# Patient Record
Sex: Female | Born: 1950
Health system: Southern US, Community
[De-identification: ages and names within clinical notes are randomized; demographics above are authoritative.]

## PROBLEM LIST (undated history)

## (undated) DIAGNOSIS — K219 Gastro-esophageal reflux disease without esophagitis: Secondary | ICD-10-CM

## (undated) DIAGNOSIS — Z860100 Personal history of colon polyps, unspecified: Secondary | ICD-10-CM

## (undated) DIAGNOSIS — E7800 Pure hypercholesterolemia, unspecified: Secondary | ICD-10-CM

## (undated) DIAGNOSIS — M810 Age-related osteoporosis without current pathological fracture: Secondary | ICD-10-CM

## (undated) DIAGNOSIS — D510 Vitamin B12 deficiency anemia due to intrinsic factor deficiency: Secondary | ICD-10-CM

## (undated) DIAGNOSIS — E039 Hypothyroidism, unspecified: Secondary | ICD-10-CM

## (undated) DIAGNOSIS — I10 Essential (primary) hypertension: Secondary | ICD-10-CM

## (undated) HISTORY — DX: Vitamin B12 deficiency anemia due to intrinsic factor deficiency: D51.0

## (undated) HISTORY — DX: Pure hypercholesterolemia, unspecified: E78.00

## (undated) HISTORY — PX: BREAST BIOPSY: SHX20

## (undated) HISTORY — DX: Personal history of colon polyps, unspecified: Z86.0100

## (undated) HISTORY — DX: Age-related osteoporosis without current pathological fracture: M81.0

## (undated) HISTORY — DX: Essential (primary) hypertension: I10

## (undated) HISTORY — DX: Personal history of colonic polyps: Z86.010

## (undated) HISTORY — DX: Hypothyroidism, unspecified: E03.9

## (undated) HISTORY — DX: Gastro-esophageal reflux disease without esophagitis: K21.9

---

## 1977-04-14 HISTORY — PX: DILATION AND CURETTAGE OF UTERUS: SHX78

## 1995-04-15 HISTORY — PX: ABDOMINAL HYSTERECTOMY: SHX81

## 2004-08-30 ENCOUNTER — Ambulatory Visit: Payer: Self-pay | Admitting: Unknown Physician Specialty

## 2005-07-22 ENCOUNTER — Ambulatory Visit: Payer: Self-pay | Admitting: Internal Medicine

## 2012-01-19 ENCOUNTER — Other Ambulatory Visit: Payer: Self-pay | Admitting: Internal Medicine

## 2012-01-19 MED ORDER — VITAMIN B-12 1000 MCG PO TABS: 1000.0000 ug | ORAL_TABLET | Freq: Every day | ORAL | Status: DC

## 2012-01-19 NOTE — Telephone Encounter (Signed)
Left message on machine at home for patient to return call. 

## 2012-01-19 NOTE — Telephone Encounter (Signed)
I think what she is needing a refill on is b12 q month.  If this is correct, can refill x 1 year.

## 2012-01-19 NOTE — Telephone Encounter (Signed)
Rx sent to pharmacy electronically.   

## 2012-01-19 NOTE — Telephone Encounter (Signed)
Pt is needing refill on b-12 [facility] uses [facility] on [address]

## 2012-01-22 ENCOUNTER — Other Ambulatory Visit: Payer: Self-pay | Admitting: *Deleted

## 2012-01-22 MED ORDER — LISINOPRIL 10 MG PO TABS: 10.0000 mg | ORAL_TABLET | Freq: Every day | ORAL | Status: DC

## 2012-01-22 NOTE — Telephone Encounter (Signed)
PC to [facility] at [facility] to add 3 additional refills.

## 2012-01-22 NOTE — Telephone Encounter (Signed)
Ok to refill x 4 

## 2012-01-22 NOTE — Telephone Encounter (Signed)
Faxed refill request received from pharmacy for LISINOPRIL Last filled by MD on 07/16/11, #30 X 5 at previous practice  Follow up scheduled for 04/23/12. Please advise refills.  Thanks.

## 2012-01-30 ENCOUNTER — Telehealth: Payer: Self-pay | Admitting: Internal Medicine

## 2012-01-30 NOTE — Telephone Encounter (Signed)
Pt mailed in new pt packet and had a stick on it wanting to know if she could have her b12 in vials for injections instead of tablets

## 2012-01-30 NOTE — Telephone Encounter (Signed)
Please confirm with pt she has been getting injections (b12) once per month.  If so, then ok to refill b12 q month (x 1 year).

## 2012-02-02 NOTE — Telephone Encounter (Signed)
Left message for pt to callback office.  

## 2012-02-04 ENCOUNTER — Other Ambulatory Visit: Payer: Self-pay | Admitting: *Deleted

## 2012-02-04 MED ORDER — METOPROLOL TARTRATE 25 MG PO TABS: 25.0000 mg | ORAL_TABLET | Freq: Two times a day (BID) | ORAL | Status: DC

## 2012-02-04 NOTE — Telephone Encounter (Signed)
Called patient to inquire about her Metoprolol Rx. Patient wanted to know if she can have B-12 injections and syringes to give herself?

## 2012-02-04 NOTE — Telephone Encounter (Signed)
I don't mind her giving herself the injections, but I would prefer her to come here for her next shot and let us make sure she is giving them properly.

## 2012-02-04 NOTE — Telephone Encounter (Signed)
Rx request sent in for refill.

## 2012-02-04 NOTE — Telephone Encounter (Signed)
Received a refill request for Metoprolol Tartrate 25mg  take 1/2 tablet by mouth twice daily, med is not on med list is it ok to refill? Per fax it was last filled 11/19/2011

## 2012-02-04 NOTE — Telephone Encounter (Signed)
I think the reason why it is not on her med list is because I haven't see her here (yet).  I think she just called in for the meds to be refilled.  I am ok to refill x 3 if can confirm has been on this regularly.  Thanks.

## 2012-02-11 NOTE — Telephone Encounter (Signed)
Patient requesting refill on B-12 injections

## 2012-02-11 NOTE — Telephone Encounter (Signed)
Left message on patients cell and with husband for patient to return call.

## 2012-02-11 NOTE — Telephone Encounter (Signed)
Ok to refill b12  q month (for 1 year).  See note - need to make sure she knows how to give.

## 2012-02-20 MED ORDER — METOPROLOL TARTRATE 25 MG PO TABS: 25.0000 mg | ORAL_TABLET | Freq: Two times a day (BID) | ORAL | Status: DC

## 2012-02-20 MED ORDER — CYANOCOBALAMIN 1000 MCG/ML IJ SOLN: 1000.0000 ug | INTRAMUSCULAR | Status: DC

## 2012-02-20 NOTE — Telephone Encounter (Signed)
Notified patient prescription was sent in. 

## 2012-03-13 ENCOUNTER — Emergency Department: Payer: Self-pay | Admitting: Emergency Medicine

## 2012-03-13 LAB — CBC WITH DIFFERENTIAL/PLATELET
Basophil %: 0.6 %
HCT: 37 % (ref 35.0–47.0)
HGB: 12.9 g/dL (ref 12.0–16.0)
Lymphocyte %: 11 %
MCH: 31 pg (ref 26.0–34.0)
MCHC: 35 g/dL (ref 32.0–36.0)
MCV: 89 fL (ref 80–100)
Monocyte #: 0.4 x10 3/mm (ref 0.2–0.9)
Monocyte %: 5.3 %
Neutrophil #: 6.7 10*3/uL — ABNORMAL HIGH (ref 1.4–6.5)
RBC: 4.17 10*6/uL (ref 3.80–5.20)
RDW: 13.3 % (ref 11.5–14.5)
WBC: 8.1 10*3/uL (ref 3.6–11.0)

## 2012-03-13 LAB — COMPREHENSIVE METABOLIC PANEL
Alkaline Phosphatase: 46 U/L — ABNORMAL LOW (ref 50–136)
Anion Gap: 9 (ref 7–16)
BUN: 13 mg/dL (ref 7–18)
Chloride: 103 mmol/L (ref 98–107)
EGFR (African American): >60
Osmolality: 272 (ref 275–301)
Potassium: 4.5 mmol/L (ref 3.5–5.1)
SGOT(AST): 50 U/L — ABNORMAL HIGH (ref 15–37)
Sodium: 136 mmol/L (ref 136–145)
Total Protein: 7.2 g/dL (ref 6.4–8.2)

## 2012-03-19 ENCOUNTER — Telehealth: Payer: Self-pay | Admitting: Internal Medicine
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
## 2012-03-19 NOTE — Telephone Encounter (Signed)
Called pt and left a message for her to call back to schedule next week and to discuss insurance

## 2012-03-19 NOTE — Telephone Encounter (Signed)
Ok to put her where you put her.  thanks
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
## 2012-03-19 NOTE — Telephone Encounter (Signed)
Spoke with pt she couldn't do Monday but you did have a slot of Thursday at 10 am I put her there but I wanted to ask you to make sure this was ok. And I let her know about the insurance part, she is ok with that and says thank you for giving her the heads up.

## 2012-03-25 ENCOUNTER — Encounter: Payer: Self-pay | Admitting: Internal Medicine

## 2012-03-25 ENCOUNTER — Telehealth: Payer: Self-pay | Admitting: *Deleted

## 2012-03-25 ENCOUNTER — Ambulatory Visit (INDEPENDENT_AMBULATORY_CARE_PROVIDER_SITE_OTHER): Payer: BC Managed Care – PPO | Admitting: Internal Medicine

## 2012-03-25 VITALS — BP 132/72 | HR 61 | Temp 98.4°F | Ht 65.0 in | Wt 139.0 lb

## 2012-03-25 DIAGNOSIS — Z23 Encounter for immunization: Secondary | ICD-10-CM

## 2012-03-25 DIAGNOSIS — E7800 Pure hypercholesterolemia, unspecified: Secondary | ICD-10-CM

## 2012-03-25 DIAGNOSIS — M810 Age-related osteoporosis without current pathological fracture: Secondary | ICD-10-CM

## 2012-03-25 DIAGNOSIS — I10 Essential (primary) hypertension: Secondary | ICD-10-CM

## 2012-03-25 DIAGNOSIS — E039 Hypothyroidism, unspecified: Secondary | ICD-10-CM

## 2012-03-25 NOTE — Telephone Encounter (Signed)
error 

## 2012-03-28 ENCOUNTER — Encounter: Payer: Self-pay | Admitting: Internal Medicine

## 2012-03-28 DIAGNOSIS — M8580 Other specified disorders of bone density and structure, unspecified site: Secondary | ICD-10-CM | POA: Insufficient documentation

## 2012-03-28 DIAGNOSIS — M810 Age-related osteoporosis without current pathological fracture: Secondary | ICD-10-CM | POA: Insufficient documentation

## 2012-03-28 DIAGNOSIS — E039 Hypothyroidism, unspecified: Secondary | ICD-10-CM | POA: Insufficient documentation

## 2012-03-28 DIAGNOSIS — E7800 Pure hypercholesterolemia, unspecified: Secondary | ICD-10-CM | POA: Insufficient documentation

## 2012-03-28 DIAGNOSIS — I10 Essential (primary) hypertension: Secondary | ICD-10-CM | POA: Insufficient documentation
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
## 2012-04-01 ENCOUNTER — Telehealth: Payer: Self-pay | Admitting: *Deleted

## 2012-04-01 NOTE — Telephone Encounter (Signed)
Please notify pt -I have not received any paper work on this pt.

## 2012-04-01 NOTE — Telephone Encounter (Signed)
Patient called to see if [employer] (her employers) had sent over forms for you to fill out concerning short term disabillity on patient.

## 2012-04-02 NOTE — Telephone Encounter (Signed)
Will complete forms

## 2012-04-02 NOTE — Telephone Encounter (Signed)
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

## 2012-04-05 NOTE — Telephone Encounter (Signed)
Patient called to check on disability forms. She said she has not been paid in two weeks and her employer's are not going to pay her until they receive forms. They also said that if she gets the forms to them before christmas they can process her paperwork and pay her next week. Employer's Fax [phone]--Claim [id number] that has to be on form

## 2012-04-05 NOTE — Telephone Encounter (Signed)
Form completed and on your desk.  Needs to be sent asap

## 2012-04-06 NOTE — Telephone Encounter (Signed)
Faxed forms , notified patient

## 2012-04-13 ENCOUNTER — Encounter: Payer: Self-pay | Admitting: Internal Medicine

## 2012-04-13 ENCOUNTER — Ambulatory Visit (INDEPENDENT_AMBULATORY_CARE_PROVIDER_SITE_OTHER): Payer: BC Managed Care – PPO | Admitting: Internal Medicine

## 2012-04-13 VITALS — BP 130/72 | HR 55 | Temp 98.2°F | Resp 16 | Wt 138.5 lb

## 2012-04-13 DIAGNOSIS — Z1331 Encounter for screening for depression: Secondary | ICD-10-CM

## 2012-04-13 DIAGNOSIS — D510 Vitamin B12 deficiency anemia due to intrinsic factor deficiency: Secondary | ICD-10-CM | POA: Insufficient documentation

## 2012-04-13 DIAGNOSIS — S2220XA Unspecified fracture of sternum, initial encounter for closed fracture: Secondary | ICD-10-CM

## 2012-04-13 DIAGNOSIS — R079 Chest pain, unspecified: Secondary | ICD-10-CM

## 2012-04-14 ENCOUNTER — Encounter: Payer: Self-pay | Admitting: Internal Medicine
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
## 2012-04-23 ENCOUNTER — Ambulatory Visit (INDEPENDENT_AMBULATORY_CARE_PROVIDER_SITE_OTHER): Payer: BC Managed Care – PPO | Admitting: Internal Medicine

## 2012-04-23 ENCOUNTER — Encounter: Payer: Self-pay | Admitting: Internal Medicine

## 2012-04-23 ENCOUNTER — Telehealth: Payer: Self-pay | Admitting: *Deleted

## 2012-04-23 VITALS — BP 118/70 | HR 56 | Temp 98.5°F | Ht 65.0 in | Wt 141.0 lb

## 2012-04-23 DIAGNOSIS — I10 Essential (primary) hypertension: Secondary | ICD-10-CM

## 2012-04-23 DIAGNOSIS — M25519 Pain in unspecified shoulder: Secondary | ICD-10-CM

## 2012-04-23 DIAGNOSIS — R945 Abnormal results of liver function studies: Secondary | ICD-10-CM

## 2012-04-23 DIAGNOSIS — K769 Liver disease, unspecified: Secondary | ICD-10-CM

## 2012-04-23 DIAGNOSIS — M25569 Pain in unspecified knee: Secondary | ICD-10-CM

## 2012-04-23 LAB — HEPATIC FUNCTION PANEL
Alkaline Phosphatase: 42 U/L (ref 39–117)
Bilirubin, Direct: 0.1 mg/dL (ref 0.0–0.3)
Total Bilirubin: 0.9 mg/dL (ref 0.3–1.2)
Total Protein: 7.1 g/dL (ref 6.0–8.3)

## 2012-04-23 NOTE — Telephone Encounter (Signed)
Patient stated while in office that her insurance does not want to pay for synthroid. They want a call from Dr. Lorin Picket giving reasons to why patient takes medication.

## 2012-04-23 NOTE — Telephone Encounter (Signed)
Called patient to get information on her synthroid prescription. Left message for patient to return call. Have information on synthroid script in pa folder on desk.

## 2012-04-23 NOTE — Telephone Encounter (Addendum)
Patient wanting lab results.   Call patient at work with any information if you are calling her at normal business hours at [phone] [phone].

## 2012-04-24 ENCOUNTER — Encounter: Payer: Self-pay | Admitting: Internal Medicine

## 2012-04-24 NOTE — Assessment & Plan Note (Signed)
Blood pressure under good control.  Follow.   
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
## 2012-04-27 ENCOUNTER — Other Ambulatory Visit: Payer: Self-pay | Admitting: Internal Medicine

## 2012-04-27 NOTE — Telephone Encounter (Signed)
Called pharmacy and was advised that rx was sent in error.

## 2012-04-27 NOTE — Telephone Encounter (Signed)
[facility] they said patient needs prior auth for synthyroid brand only. Call [phone] patient's ID #[phone].

## 2012-04-27 NOTE — Telephone Encounter (Signed)
Called [phone] spoke to Reuel Boom he is faxing the form over, case number [id number]

## 2012-04-27 NOTE — Telephone Encounter (Signed)
Synthroid 100 mcg   #30

## 2012-04-29 ENCOUNTER — Ambulatory Visit: Payer: BC Managed Care – PPO | Admitting: Internal Medicine

## 2012-05-03 ENCOUNTER — Encounter: Payer: Self-pay | Admitting: Internal Medicine

## 2012-05-06 ENCOUNTER — Other Ambulatory Visit: Payer: Self-pay | Admitting: Internal Medicine

## 2012-05-06 MED ORDER — SIMVASTATIN 10 MG PO TABS: 10.0000 mg | ORAL_TABLET | Freq: Every day | ORAL | Status: DC

## 2012-05-06 NOTE — Telephone Encounter (Signed)
simvastatin (ZOCOR) 10 MG tablet  #30

## 2012-05-14 ENCOUNTER — Encounter: Payer: Self-pay | Admitting: Internal Medicine

## 2012-05-15 ENCOUNTER — Encounter: Payer: Self-pay | Admitting: Internal Medicine

## 2012-05-19 ENCOUNTER — Ambulatory Visit (INDEPENDENT_AMBULATORY_CARE_PROVIDER_SITE_OTHER): Payer: BC Managed Care – PPO | Admitting: Internal Medicine

## 2012-05-19 ENCOUNTER — Encounter: Payer: Self-pay | Admitting: Internal Medicine

## 2012-05-19 VITALS — BP 116/80 | HR 60 | Temp 97.8°F | Ht 65.0 in | Wt 140.5 lb

## 2012-05-19 DIAGNOSIS — M25519 Pain in unspecified shoulder: Secondary | ICD-10-CM

## 2012-05-19 DIAGNOSIS — M542 Cervicalgia: Secondary | ICD-10-CM

## 2012-05-19 DIAGNOSIS — M25569 Pain in unspecified knee: Secondary | ICD-10-CM
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
## 2012-06-03 ENCOUNTER — Other Ambulatory Visit: Payer: Self-pay | Admitting: *Deleted

## 2012-06-03 MED ORDER — LISINOPRIL 10 MG PO TABS: 10.0000 mg | ORAL_TABLET | Freq: Every day | ORAL | Status: DC

## 2012-06-03 NOTE — Telephone Encounter (Signed)
Sent in to pharmacy.  

## 2012-06-12 ENCOUNTER — Encounter: Payer: Self-pay | Admitting: Internal Medicine

## 2012-06-25 ENCOUNTER — Ambulatory Visit (INDEPENDENT_AMBULATORY_CARE_PROVIDER_SITE_OTHER): Payer: BC Managed Care – PPO | Admitting: Internal Medicine

## 2012-06-25 ENCOUNTER — Encounter: Payer: Self-pay | Admitting: Internal Medicine

## 2012-06-25 ENCOUNTER — Other Ambulatory Visit (HOSPITAL_COMMUNITY)
Admission: RE | Admit: 2012-06-25 | Discharge: 2012-06-25 | Disposition: A | Discharge status: Home / Self Care | Payer: BC Managed Care – PPO | Source: Ambulatory Visit | Attending: Internal Medicine | Admitting: Internal Medicine

## 2012-06-25 VITALS — BP 120/70 | HR 61 | Temp 97.6°F | Ht 65.0 in | Wt 141.8 lb

## 2012-06-25 DIAGNOSIS — Z860100 Personal history of colon polyps, unspecified: Secondary | ICD-10-CM

## 2012-06-25 DIAGNOSIS — E039 Hypothyroidism, unspecified: Secondary | ICD-10-CM

## 2012-06-25 DIAGNOSIS — Z1151 Encounter for screening for human papillomavirus (HPV): Secondary | ICD-10-CM | POA: Insufficient documentation

## 2012-06-25 DIAGNOSIS — E7800 Pure hypercholesterolemia, unspecified: Secondary | ICD-10-CM

## 2012-06-25 DIAGNOSIS — R300 Dysuria: Secondary | ICD-10-CM

## 2012-06-25 DIAGNOSIS — D510 Vitamin B12 deficiency anemia due to intrinsic factor deficiency: Secondary | ICD-10-CM

## 2012-06-25 DIAGNOSIS — I10 Essential (primary) hypertension: Secondary | ICD-10-CM

## 2012-06-25 DIAGNOSIS — M810 Age-related osteoporosis without current pathological fracture: Secondary | ICD-10-CM

## 2012-06-25 DIAGNOSIS — Z01419 Encounter for gynecological examination (general) (routine) without abnormal findings: Secondary | ICD-10-CM | POA: Insufficient documentation

## 2012-06-25 DIAGNOSIS — N760 Acute vaginitis: Secondary | ICD-10-CM

## 2012-06-25 LAB — POCT URINALYSIS DIPSTICK
Nitrite, UA: NEGATIVE
Protein, UA: NEGATIVE
Spec Grav, UA: 1.01
Urobilinogen, UA: 0.2

## 2012-06-27 ENCOUNTER — Encounter: Payer: Self-pay | Admitting: Internal Medicine

## 2012-06-27 LAB — URINE CULTURE: Colony Count: NO GROWTH

## 2012-06-27 NOTE — Assessment & Plan Note (Signed)
On simvastatin.  Check lipid panel and liver function.   

## 2012-06-27 NOTE — Assessment & Plan Note (Signed)
Not on Boniva.  Last bone density 01/18/10 revealed osteopenia with no significant change from previous.  Improved from the initial.  Continue calcium and vitamin D.

## 2012-06-27 NOTE — Assessment & Plan Note (Signed)
Continue b12 injections.  

## 2012-06-27 NOTE — Assessment & Plan Note (Signed)
On replacement.  Follow tsh.  

## 2012-06-27 NOTE — Assessment & Plan Note (Signed)
Colonoscopy as outlined.  Currently asymptomatic.  Follow.   

## 2012-06-27 NOTE — Assessment & Plan Note (Signed)
Blood pressure under good control.  Follow.  Check metabolic panel.  Same medication regimen.   

## 2012-06-27 NOTE — Progress Notes (Signed)
Subjective:    Patient ID: Wendy Nichols, female    DOB: 22-Nov-1950, 62 y.o.   MRN: [id number]  HPI 62 year old female with past history of hypertension, hypothyroidism, pernicious anemia and hypercholesterolemia who comes in today to follow up on these issues as well as for a complete physical exam.  She states she is doing relatively well.  Handling the stress of her husband's health issues relatively well.  Had her mammogram yesterday.  Saw dermatology.  Had a skin lesion removed from her right leg, right shoulder, left arm and forehead (left side).  No chest pain or tightness.  Doing stretches.  Finished PT.  Neck and shoulder better.  No nausea or vomiting.  Bowels stable.  No vaginal problems.    Past Medical History  Diagnosis Date  . Hypertension   . Hypothyroidism   . Pernicious anemia   . Hypercholesterolemia   . GERD (gastroesophageal reflux disease)   . Osteoporosis   . History of colon polyps     Current Outpatient Prescriptions on File Prior to Visit  Medication Sig Dispense Refill  . aspirin 81 MG tablet Take 81 mg by mouth every other day.      . cyanocobalamin (,VITAMIN B-12,) 1000 MCG/ML injection Inject 1 mL (1,000 mcg total) into the muscle every 30 (thirty) days.  30 mL  12  . levothyroxine (SYNTHROID, LEVOTHROID) 100 MCG tablet Take 100 mcg by mouth daily.      Marland Kitchen lisinopril (PRINIVIL,ZESTRIL) 10 MG tablet Take 1 tablet (10 mg total) by mouth daily.  30 tablet  5  . metoprolol tartrate (LOPRESSOR) 25 MG tablet Take 1/2 tablet once a day      . simvastatin (ZOCOR) 10 MG tablet Take 1 tablet (10 mg total) by mouth at bedtime.  30 tablet  5   No current facility-administered medications on file prior to visit.    Review of Systems Patient denies any headache, lightheadedness or dizziness.  No sinus or allergy symptoms.   No chest pain, tightness or palpitations.  No acid reflux.  No increased shortness of breath, cough or congestion.  No nausea or vomiting.  No  abdominal pain or cramping.  No bowel change, such as diarrhea, constipation, BRBPR or melana.  No urine change.        Objective:   Physical Exam Filed Vitals:   06/25/12 1333  BP: 120/70  Pulse: 61  Temp: 97.6 F (28.79 C)   62 year old female in no acute distress.   HEENT:  Nares- clear.  Oropharynx - without lesions. NECK:  Supple.  Nontender.  No audible bruit.  HEART:  Appears to be regular. LUNGS:  No crackles or wheezing audible.  Respirations even and unlabored.  RADIAL PULSE:  Equal bilaterally.    BREASTS:  No nipple discharge or nipple retraction present.  Could not appreciate any distinct nodules or axillary adenopathy.  ABDOMEN:  Soft, nontender.  Bowel sounds present and normal.  No audible abdominal bruit.  GU:  Normal external genitalia.  Vaginal vault without lesions.  Cervix identified.  Pap performed. Could not appreciate any adnexal masses or tenderness.   RECTAL:  Heme negative.   EXTREMITIES:  No increased edema present.  DP pulses palpable and equal bilaterally.           Assessment & Plan:  HEALTH MAINTENANCE.  Physical today.  Mammogram 06/24/12 - benign.  Colonoscopy 01/09/10 with four 1mm polyps in the sigmoid, descending and ascending colon and internal hemorrhoids.

## 2012-06-29 ENCOUNTER — Telehealth: Payer: Self-pay | Admitting: Internal Medicine

## 2012-06-29 LAB — WET PREP BY MOLECULAR PROBE
Candida species: NEGATIVE
Trichomonas vaginosis: NEGATIVE

## 2012-06-29 NOTE — Telephone Encounter (Signed)
Called patient and told her that we do not have her results yet.

## 2012-06-29 NOTE — Telephone Encounter (Signed)
Patient wanting lab results. 

## 2012-06-30 ENCOUNTER — Other Ambulatory Visit: Payer: Self-pay | Admitting: Internal Medicine

## 2012-06-30 DIAGNOSIS — R319 Hematuria, unspecified: Secondary | ICD-10-CM

## 2012-06-30 NOTE — Progress Notes (Signed)
Order placed for follow up urine.

## 2012-07-02 NOTE — Telephone Encounter (Signed)
Called patient back to let her know I still do not have her lab results.

## 2012-07-03 ENCOUNTER — Encounter: Payer: Self-pay | Admitting: Internal Medicine

## 2012-07-05 NOTE — Telephone Encounter (Signed)
Patient notified of lab results

## 2012-07-12 ENCOUNTER — Other Ambulatory Visit (INDEPENDENT_AMBULATORY_CARE_PROVIDER_SITE_OTHER): Payer: BC Managed Care – PPO

## 2012-07-12 ENCOUNTER — Encounter: Payer: Self-pay | Admitting: Internal Medicine

## 2012-07-12 DIAGNOSIS — I10 Essential (primary) hypertension: Secondary | ICD-10-CM

## 2012-07-12 DIAGNOSIS — R319 Hematuria, unspecified: Secondary | ICD-10-CM

## 2012-07-12 DIAGNOSIS — Z0000 Encounter for general adult medical examination without abnormal findings: Secondary | ICD-10-CM

## 2012-07-12 DIAGNOSIS — D510 Vitamin B12 deficiency anemia due to intrinsic factor deficiency: Secondary | ICD-10-CM

## 2012-07-12 DIAGNOSIS — E039 Hypothyroidism, unspecified: Secondary | ICD-10-CM

## 2012-07-12 DIAGNOSIS — E7800 Pure hypercholesterolemia, unspecified: Secondary | ICD-10-CM

## 2012-07-12 LAB — HEPATIC FUNCTION PANEL
ALT: 16 U/L (ref 0–35)
Albumin: 4.2 g/dL (ref 3.5–5.2)
Bilirubin, Direct: 0.1 mg/dL (ref 0.0–0.3)
Total Protein: 7.2 g/dL (ref 6.0–8.3)

## 2012-07-12 LAB — BASIC METABOLIC PANEL
BUN: 17 mg/dL (ref 6–23)
Chloride: 102 mEq/L (ref 96–112)
GFR: 73.24 mL/min (ref >60.00)
Potassium: 4.4 mEq/L (ref 3.5–5.1)
Sodium: 135 mEq/L (ref 135–145)

## 2012-07-12 LAB — URINALYSIS, ROUTINE W REFLEX MICROSCOPIC
Bilirubin Urine: NEGATIVE
Total Protein, Urine: NEGATIVE
Urine Glucose: NEGATIVE
Urobilinogen, UA: 0.2 (ref 0.0–1.0)

## 2012-07-12 LAB — CBC WITH DIFFERENTIAL/PLATELET
Basophils Absolute: 0 10*3/uL (ref 0.0–0.1)
Basophils Relative: 0.5 % (ref 0.0–3.0)
Eosinophils Absolute: 0 10*3/uL (ref 0.0–0.7)
Lymphocytes Relative: 20.9 % (ref 12.0–46.0)
MCHC: 34.8 g/dL (ref 30.0–36.0)
MCV: 87.5 fl (ref 78.0–100.0)
Monocytes Absolute: 0.4 10*3/uL (ref 0.1–1.0)
Neutrophils Relative %: 69.5 % (ref 43.0–77.0)
Platelets: 258 10*3/uL (ref 150.0–400.0)
RBC: 4.11 Mil/uL (ref 3.87–5.11)
RDW: 13.5 % (ref 11.5–14.6)

## 2012-07-12 LAB — LIPID PANEL
Cholesterol: 158 mg/dL (ref 0–200)
LDL Cholesterol: 81 mg/dL (ref 0–99)
Triglycerides: 48 mg/dL (ref 0.0–149.0)
VLDL: 9.6 mg/dL (ref 0.0–40.0)

## 2012-07-13 ENCOUNTER — Encounter: Payer: Self-pay | Admitting: Internal Medicine

## 2012-07-22 ENCOUNTER — Other Ambulatory Visit: Payer: Self-pay | Admitting: Internal Medicine

## 2012-07-22 NOTE — Telephone Encounter (Signed)
Med filled.  

## 2012-07-29 ENCOUNTER — Telehealth: Payer: Self-pay | Admitting: Internal Medicine

## 2012-07-29 NOTE — Telephone Encounter (Signed)
Left voicemail to confirm that everything was covered under her physical appt on 06/25/12

## 2012-07-29 NOTE — Telephone Encounter (Signed)
Patient wanted to make sure that everything for her physical including labs and pap were filed under her physical appointment for 3.14.14.

## 2012-08-03 ENCOUNTER — Telehealth: Payer: Self-pay | Admitting: Internal Medicine

## 2012-08-03 NOTE — Telephone Encounter (Signed)
I am ok to change this to preventive.  Let me know if I need to do anything.

## 2012-08-03 NOTE — Telephone Encounter (Signed)
Please Advise

## 2012-08-03 NOTE — Telephone Encounter (Signed)
I have forwarded this information to [employer] so she can change the dx code.

## 2012-08-03 NOTE — Telephone Encounter (Signed)
Patient's insurance company informed her that her labs that were done in March were not filed under preventative to go along with her physical . Therefore, she was charged and she is wanting the labs re-filed  under preventative .

## 2012-08-25 ENCOUNTER — Telehealth: Payer: Self-pay | Admitting: *Deleted

## 2012-08-25 NOTE — Telephone Encounter (Signed)
Pt wanted to know if her insurance has been re-filed. Pt stated she never heard anything back & wanted to be sure that her labs from March were re-filed under preventive care

## 2012-10-05 ENCOUNTER — Encounter: Payer: Self-pay | Admitting: *Deleted

## 2012-10-18 ENCOUNTER — Telehealth: Payer: Self-pay | Admitting: Internal Medicine

## 2012-10-18 NOTE — Telephone Encounter (Signed)
Patient called and said Per [employer] and [facility] her labs for 07/11/2012 should have been coded Preventative vs. Medical. Can we please change this for the patient I will let charge correction know once you give me the okay. I do believe you will have to create an addendum on the labs for that date of service.

## 2012-10-19 NOTE — Telephone Encounter (Signed)
I am ok with this.  She had called earlier.  Let me know if I need to do something else.  Thanks.

## 2012-10-19 NOTE — Telephone Encounter (Signed)
I sent an email to charge correction waiting to hear back from them.

## 2012-10-26 NOTE — Telephone Encounter (Signed)
I received an email back asking for a correct dx code of V70.0 routine visit per Dr. Lorin Picket.  I have sent this information back to charge correction and they should put this code on the invoice.

## 2012-10-27 ENCOUNTER — Other Ambulatory Visit: Payer: Self-pay | Admitting: Internal Medicine

## 2012-11-08 ENCOUNTER — Telehealth: Payer: Self-pay | Admitting: Internal Medicine

## 2012-11-08 NOTE — Telephone Encounter (Signed)
I called patient to advise her that they were going to change dx code and resubmit.

## 2012-11-08 NOTE — Telephone Encounter (Signed)
Patient calling back about her [employer] bill she states the account number is [id number] and she can't get anywhere with them.  I called and spoke with Deanne today at [facility] and asked her to please add the dx code of V70.0 since this was a routine exam.  She has changed the dx code and is going to refile this claim.  At first she said that the insurance paid everything except 75.10 which was going to patient's deductible. However after I gave her the code that should have been used she changed and will send to be refiled.

## 2012-11-30 ENCOUNTER — Other Ambulatory Visit: Payer: Self-pay | Admitting: *Deleted

## 2012-11-30 MED ORDER — METOPROLOL TARTRATE 25 MG PO TABS: 12.5000 mg | ORAL_TABLET | Freq: Every day | ORAL | Status: DC

## 2012-12-14 ENCOUNTER — Other Ambulatory Visit: Payer: Self-pay | Admitting: *Deleted

## 2012-12-15 MED ORDER — LISINOPRIL 10 MG PO TABS: 10.0000 mg | ORAL_TABLET | Freq: Every day | ORAL | Status: DC

## 2012-12-16 ENCOUNTER — Other Ambulatory Visit: Payer: Self-pay | Admitting: *Deleted

## 2012-12-16 MED ORDER — LISINOPRIL 10 MG PO TABS: 10.0000 mg | ORAL_TABLET | Freq: Every day | ORAL | Status: DC

## 2012-12-16 NOTE — Telephone Encounter (Signed)
Refill done.  

## 2012-12-30 ENCOUNTER — Ambulatory Visit: Payer: BC Managed Care – PPO | Admitting: Internal Medicine

## 2013-01-04 ENCOUNTER — Encounter: Payer: Self-pay | Admitting: Orthopedic Surgery

## 2013-01-12 ENCOUNTER — Encounter: Payer: Self-pay | Admitting: Orthopedic Surgery

## 2013-01-14 ENCOUNTER — Ambulatory Visit (INDEPENDENT_AMBULATORY_CARE_PROVIDER_SITE_OTHER): Payer: BC Managed Care – PPO | Admitting: Internal Medicine

## 2013-01-14 ENCOUNTER — Encounter: Payer: Self-pay | Admitting: Internal Medicine

## 2013-01-14 VITALS — BP 120/80 | HR 59 | Temp 98.0°F | Ht 65.0 in | Wt 142.5 lb

## 2013-01-14 DIAGNOSIS — I10 Essential (primary) hypertension: Secondary | ICD-10-CM

## 2013-01-14 DIAGNOSIS — D510 Vitamin B12 deficiency anemia due to intrinsic factor deficiency: Secondary | ICD-10-CM

## 2013-01-14 DIAGNOSIS — R300 Dysuria: Secondary | ICD-10-CM

## 2013-01-14 DIAGNOSIS — Z23 Encounter for immunization: Secondary | ICD-10-CM

## 2013-01-14 DIAGNOSIS — Z86010 Personal history of colonic polyps: Secondary | ICD-10-CM

## 2013-01-14 DIAGNOSIS — E7800 Pure hypercholesterolemia, unspecified: Secondary | ICD-10-CM

## 2013-01-14 DIAGNOSIS — M810 Age-related osteoporosis without current pathological fracture: Secondary | ICD-10-CM

## 2013-01-14 DIAGNOSIS — E039 Hypothyroidism, unspecified: Secondary | ICD-10-CM

## 2013-01-16 ENCOUNTER — Encounter: Payer: Self-pay | Admitting: Internal Medicine

## 2013-01-16 NOTE — Assessment & Plan Note (Signed)
On replacement.  Follow tsh.  

## 2013-01-16 NOTE — Assessment & Plan Note (Signed)
Blood pressure under good control.  Follow.  Check metabolic panel.  Same medication regimen.   

## 2013-01-16 NOTE — Assessment & Plan Note (Signed)
Colonoscopy as outlined.  Currently asymptomatic.  Follow.   

## 2013-01-16 NOTE — Assessment & Plan Note (Signed)
On simvastatin.  Check lipid panel and liver function.   

## 2013-01-16 NOTE — Progress Notes (Signed)
Subjective:    Patient ID: Wendy Nichols, female    DOB: [birth year], 62 y.o.   MRN: [id number]  HPI 62 year old female with past history of hypertension, hypothyroidism, pernicious anemia and hypercholesterolemia who comes in today for a scheduled follow up.   She states she is doing relatively well.  Handling the stress of her husband's health issues relatively well.  No chest pain or tightness.  Doing stretches.   Neck and shoulder better.  No nausea or vomiting.  Bowels stable. Still some issues with her right knee.  Going to more therapy.  Still some numbness.  Seeing ortho.   Overall better.  Blood pressure averaging 114-135/80.     Past Medical History  Diagnosis Date  . Hypertension   . Hypothyroidism   . Pernicious anemia   . Hypercholesterolemia   . GERD (gastroesophageal reflux disease)   . Osteoporosis   . History of colon polyps     Current Outpatient Prescriptions on File Prior to Visit  Medication Sig Dispense Refill  . aspirin 81 MG tablet Take 81 mg by mouth every other day.      . cyanocobalamin (,VITAMIN B-12,) 1000 MCG/ML injection Inject 1 mL (1,000 mcg total) into the muscle every 30 (thirty) days.  30 mL  12  . lisinopril (PRINIVIL,ZESTRIL) 10 MG tablet Take 1 tablet (10 mg total) by mouth daily.  30 tablet  5  . metoprolol tartrate (LOPRESSOR) 25 MG tablet Take 0.5 tablets (12.5 mg total) by mouth daily.  15 tablet  5  . simvastatin (ZOCOR) 10 MG tablet TAKE 1 TABLET BY MOUTH AT BEDTIME.  30 tablet  5  . SYNTHROID 100 MCG tablet TAKE 1 TABLET BY MOUTH EVERY DAY  30 tablet  10   No current facility-administered medications on file prior to visit.    Review of Systems Patient denies any headache, lightheadedness or dizziness.  No sinus or allergy symptoms.   No chest pain, tightness or palpitations.  No acid reflux.  No increased shortness of breath, cough or congestion.  No nausea or vomiting.  No abdominal pain or cramping.  No bowel change, such as diarrhea,  constipation, BRBPR or melana.  No urine change.  Persistent right knee pain as outlined.  Physical therapy.  Seeing ortho.       Objective:   Physical Exam  Filed Vitals:   01/14/13 1004  BP: 120/80  Pulse: 59  Temp: 98 F (34.34 C)   61 year old female in no acute distress.   HEENT:  Nares- clear.  Oropharynx - without lesions. NECK:  Supple.  Nontender.  No audible bruit.  HEART:  Appears to be regular. LUNGS:  No crackles or wheezing audible.  Respirations even and unlabored.  RADIAL PULSE:  Equal bilaterally.   ABDOMEN:  Soft, nontender.  Bowel sounds present and normal.  No audible abdominal bruit.   EXTREMITIES:  No increased edema present.  DP pulses palpable and equal bilaterally.           Assessment & Plan:  HEALTH MAINTENANCE.  Physical last visit.   Mammogram 06/24/12 - benign.  Colonoscopy 01/09/10 with four 1mm polyps in the sigmoid, descending and ascending colon and internal hemorrhoids.

## 2013-01-16 NOTE — Assessment & Plan Note (Signed)
Continue b12 injections.  

## 2013-01-16 NOTE — Assessment & Plan Note (Signed)
Not on Boniva.  Last bone density 01/18/10 revealed osteopenia with no significant change from previous.  Improved from the initial.  Continue calcium and vitamin D.  Check vitamin D level with next labs.

## 2013-01-18 ENCOUNTER — Other Ambulatory Visit (INDEPENDENT_AMBULATORY_CARE_PROVIDER_SITE_OTHER): Payer: BC Managed Care – PPO

## 2013-01-18 DIAGNOSIS — E7800 Pure hypercholesterolemia, unspecified: Secondary | ICD-10-CM

## 2013-01-18 DIAGNOSIS — R300 Dysuria: Secondary | ICD-10-CM

## 2013-01-18 DIAGNOSIS — I10 Essential (primary) hypertension: Secondary | ICD-10-CM

## 2013-01-18 DIAGNOSIS — M810 Age-related osteoporosis without current pathological fracture: Secondary | ICD-10-CM

## 2013-01-18 LAB — URINALYSIS, ROUTINE W REFLEX MICROSCOPIC
Leukocytes, UA: NEGATIVE
Nitrite: NEGATIVE
Specific Gravity, Urine: 1.02 (ref 1.000–1.030)
Urine Glucose: NEGATIVE
Urobilinogen, UA: 0.2 (ref 0.0–1.0)
pH: 6 (ref 5.0–8.0)

## 2013-01-18 LAB — HEPATIC FUNCTION PANEL
ALT: 22 U/L (ref 0–35)
Albumin: 4.4 g/dL (ref 3.5–5.2)
Alkaline Phosphatase: 35 U/L — ABNORMAL LOW (ref 39–117)
Total Protein: 7.2 g/dL (ref 6.0–8.3)

## 2013-01-18 LAB — LIPID PANEL
Cholesterol: 186 mg/dL (ref 0–200)
LDL Cholesterol: 99 mg/dL (ref 0–99)
Total CHOL/HDL Ratio: 3
Triglycerides: 66 mg/dL (ref 0.0–149.0)
VLDL: 13.2 mg/dL (ref 0.0–40.0)

## 2013-01-18 LAB — BASIC METABOLIC PANEL
BUN: 16 mg/dL (ref 6–23)
Calcium: 10.1 mg/dL (ref 8.4–10.5)
Creatinine, Ser: 1.1 mg/dL (ref 0.4–1.2)
GFR: 56.52 mL/min — ABNORMAL LOW (ref >60.00)
Glucose, Bld: 99 mg/dL (ref 70–99)

## 2013-01-20 ENCOUNTER — Other Ambulatory Visit: Payer: Self-pay | Admitting: Internal Medicine

## 2013-01-20 DIAGNOSIS — I10 Essential (primary) hypertension: Secondary | ICD-10-CM

## 2013-01-20 NOTE — Progress Notes (Signed)
Orders placed for f/u labs.  

## 2013-02-03 ENCOUNTER — Other Ambulatory Visit (INDEPENDENT_AMBULATORY_CARE_PROVIDER_SITE_OTHER): Payer: BC Managed Care – PPO

## 2013-02-03 ENCOUNTER — Other Ambulatory Visit: Payer: Self-pay | Admitting: Internal Medicine

## 2013-02-03 DIAGNOSIS — I10 Essential (primary) hypertension: Secondary | ICD-10-CM

## 2013-02-03 NOTE — Progress Notes (Signed)
Order placed for f/u met b.  

## 2013-02-08 ENCOUNTER — Encounter: Payer: Self-pay | Admitting: *Deleted

## 2013-02-12 ENCOUNTER — Encounter: Payer: Self-pay | Admitting: Orthopedic Surgery

## 2013-03-02 ENCOUNTER — Telehealth: Payer: Self-pay | Admitting: Internal Medicine

## 2013-03-02 MED ORDER — METOPROLOL TARTRATE 25 MG PO TABS: 12.5000 mg | ORAL_TABLET | Freq: Every day | ORAL | Status: DC

## 2013-03-02 MED ORDER — SIMVASTATIN 10 MG PO TABS: 10.0000 mg | ORAL_TABLET | Freq: Every evening | ORAL | Status: DC

## 2013-03-02 MED ORDER — SYNTHROID 100 MCG PO TABS: 100.0000 ug | ORAL_TABLET | Freq: Every day | ORAL | Status: DC

## 2013-03-02 MED ORDER — LISINOPRIL 10 MG PO TABS: 10.0000 mg | ORAL_TABLET | Freq: Every day | ORAL | Status: DC

## 2013-03-02 NOTE — Telephone Encounter (Signed)
rx's sent in for 90 day supply for metoprolol, synthroid, simvastatin, and lisinopril.

## 2013-03-02 NOTE — Telephone Encounter (Signed)
The patient's insurance is going to run out the 1st of December and she will be with out insurance for a couple of months. The patient wants a 90 day supply of all of her prescriptions sent to the pharmacy .

## 2013-03-02 NOTE — Telephone Encounter (Signed)
Pt.notified

## 2013-03-02 NOTE — Telephone Encounter (Signed)
Okay to refill? 

## 2013-03-07 ENCOUNTER — Other Ambulatory Visit: Payer: Self-pay | Admitting: *Deleted

## 2013-03-07 MED ORDER — SIMVASTATIN 10 MG PO TABS: 10.0000 mg | ORAL_TABLET | Freq: Every evening | ORAL | Status: DC

## 2013-03-07 MED ORDER — LISINOPRIL 10 MG PO TABS: 10.0000 mg | ORAL_TABLET | Freq: Every day | ORAL | Status: DC

## 2013-03-07 MED ORDER — METOPROLOL TARTRATE 25 MG PO TABS: 12.5000 mg | ORAL_TABLET | Freq: Every day | ORAL | Status: DC

## 2013-03-07 MED ORDER — LEVOTHYROXINE SODIUM 100 MCG PO TABS: 100.0000 ug | ORAL_TABLET | Freq: Every day | ORAL | Status: DC

## 2013-03-08 ENCOUNTER — Other Ambulatory Visit (INDEPENDENT_AMBULATORY_CARE_PROVIDER_SITE_OTHER): Payer: BC Managed Care – PPO

## 2013-03-08 DIAGNOSIS — I10 Essential (primary) hypertension: Secondary | ICD-10-CM

## 2013-03-08 LAB — BASIC METABOLIC PANEL
BUN: 14 mg/dL (ref 6–23)
Calcium: 9 mg/dL (ref 8.4–10.5)
Creatinine, Ser: 0.8 mg/dL (ref 0.4–1.2)
GFR: 75.15 mL/min (ref >60.00)
Glucose, Bld: 94 mg/dL (ref 70–99)

## 2013-03-09 ENCOUNTER — Encounter: Payer: Self-pay | Admitting: *Deleted

## 2013-03-09 ENCOUNTER — Other Ambulatory Visit: Payer: Self-pay | Admitting: Internal Medicine

## 2013-03-09 DIAGNOSIS — E871 Hypo-osmolality and hyponatremia: Secondary | ICD-10-CM

## 2013-03-09 NOTE — Progress Notes (Signed)
F/u sodium level ordered.

## 2013-03-17 ENCOUNTER — Other Ambulatory Visit: Payer: BC Managed Care – PPO

## 2013-03-18 ENCOUNTER — Other Ambulatory Visit: Payer: Self-pay | Admitting: *Deleted

## 2013-03-18 MED ORDER — SIMVASTATIN 10 MG PO TABS: 10.0000 mg | ORAL_TABLET | Freq: Every evening | ORAL | Status: DC

## 2013-03-18 MED ORDER — LEVOTHYROXINE SODIUM 100 MCG PO TABS: 100.0000 ug | ORAL_TABLET | Freq: Every day | ORAL | Status: DC

## 2013-03-18 MED ORDER — LISINOPRIL 10 MG PO TABS: 10.0000 mg | ORAL_TABLET | Freq: Every day | ORAL | Status: DC

## 2013-03-18 MED ORDER — METOPROLOL TARTRATE 25 MG PO TABS: 12.5000 mg | ORAL_TABLET | Freq: Every day | ORAL | Status: DC

## 2013-04-08 ENCOUNTER — Telehealth: Payer: Self-pay | Admitting: Internal Medicine

## 2013-04-08 MED ORDER — SYNTHROID 100 MCG PO TABS: 100.0000 ug | ORAL_TABLET | Freq: Every day | ORAL | Status: DC

## 2013-04-08 NOTE — Telephone Encounter (Signed)
RX sent to the pharmacy  

## 2013-04-08 NOTE — Telephone Encounter (Signed)
States [facility] told her that she needed to contact us and have Dr. Lorin Picket  fax a DAW form to [facility] so that the pt can receive brand name Synthroid.  [facility] received rx for generic.  Pt states she cannot take generic. Pt states she is almost completely out.

## 2013-04-11 ENCOUNTER — Other Ambulatory Visit: Payer: Self-pay | Admitting: *Deleted

## 2013-04-19 ENCOUNTER — Other Ambulatory Visit: Payer: BC Managed Care – PPO

## 2013-05-18 ENCOUNTER — Other Ambulatory Visit: Payer: Self-pay | Admitting: Internal Medicine

## 2013-07-06 ENCOUNTER — Encounter: Payer: Self-pay | Admitting: Internal Medicine

## 2013-07-06 ENCOUNTER — Ambulatory Visit (INDEPENDENT_AMBULATORY_CARE_PROVIDER_SITE_OTHER): Payer: BC Managed Care – PPO | Admitting: Internal Medicine

## 2013-07-06 ENCOUNTER — Encounter (INDEPENDENT_AMBULATORY_CARE_PROVIDER_SITE_OTHER): Payer: Self-pay

## 2013-07-06 VITALS — BP 110/70 | HR 56 | Temp 97.9°F | Ht 64.5 in | Wt 138.8 lb

## 2013-07-06 DIAGNOSIS — F439 Reaction to severe stress, unspecified: Secondary | ICD-10-CM

## 2013-07-06 DIAGNOSIS — Z860100 Personal history of colon polyps, unspecified: Secondary | ICD-10-CM

## 2013-07-06 DIAGNOSIS — D510 Vitamin B12 deficiency anemia due to intrinsic factor deficiency: Secondary | ICD-10-CM

## 2013-07-06 DIAGNOSIS — E039 Hypothyroidism, unspecified: Secondary | ICD-10-CM

## 2013-07-06 DIAGNOSIS — Z733 Stress, not elsewhere classified: Secondary | ICD-10-CM

## 2013-07-06 DIAGNOSIS — M810 Age-related osteoporosis without current pathological fracture: Secondary | ICD-10-CM

## 2013-07-06 DIAGNOSIS — I10 Essential (primary) hypertension: Secondary | ICD-10-CM

## 2013-07-06 DIAGNOSIS — E7800 Pure hypercholesterolemia, unspecified: Secondary | ICD-10-CM

## 2013-07-06 LAB — CBC WITH DIFFERENTIAL/PLATELET
BASOS PCT: 0.3 % (ref 0.0–3.0)
Basophils Absolute: 0 10*3/uL (ref 0.0–0.1)
EOS ABS: 0.1 10*3/uL (ref 0.0–0.7)
Eosinophils Relative: 1.6 % (ref 0.0–5.0)
HEMATOCRIT: 38.4 % (ref 36.0–46.0)
HEMOGLOBIN: 12.9 g/dL (ref 12.0–15.0)
Lymphocytes Relative: 22.1 % (ref 12.0–46.0)
Lymphs Abs: 1.1 10*3/uL (ref 0.7–4.0)
MCHC: 33.6 g/dL (ref 30.0–36.0)
MCV: 88.2 fl (ref 78.0–100.0)
Monocytes Absolute: 0.4 10*3/uL (ref 0.1–1.0)
Monocytes Relative: 8 % (ref 3.0–12.0)
NEUTROS ABS: 3.5 10*3/uL (ref 1.4–7.7)
Neutrophils Relative %: 68 % (ref 43.0–77.0)
Platelets: 261 10*3/uL (ref 150.0–400.0)
RBC: 4.36 Mil/uL (ref 3.87–5.11)
RDW: 13.2 % (ref 11.5–14.6)
WBC: 5.1 10*3/uL (ref 4.5–10.5)

## 2013-07-06 LAB — HEPATIC FUNCTION PANEL
ALT: 17 U/L (ref 0–35)
AST: 20 U/L (ref 0–37)
Albumin: 4.5 g/dL (ref 3.5–5.2)
Alkaline Phosphatase: 40 U/L (ref 39–117)
BILIRUBIN DIRECT: 0.1 mg/dL (ref 0.0–0.3)
Total Bilirubin: 0.9 mg/dL (ref 0.3–1.2)
Total Protein: 7 g/dL (ref 6.0–8.3)

## 2013-07-06 LAB — TSH: TSH: 1.47 u[IU]/mL (ref 0.35–5.50)

## 2013-07-06 LAB — LIPID PANEL
CHOL/HDL RATIO: 3
Cholesterol: 185 mg/dL (ref 0–200)
HDL: 71 mg/dL (ref >39.00)
LDL Cholesterol: 103 mg/dL — ABNORMAL HIGH (ref 0–99)
Triglycerides: 55 mg/dL (ref 0.0–149.0)
VLDL: 11 mg/dL (ref 0.0–40.0)

## 2013-07-06 LAB — BASIC METABOLIC PANEL
BUN: 15 mg/dL (ref 6–23)
CHLORIDE: 102 meq/L (ref 96–112)
CO2: 25 mEq/L (ref 19–32)
Calcium: 9.8 mg/dL (ref 8.4–10.5)
Creatinine, Ser: 0.9 mg/dL (ref 0.4–1.2)
GFR: 67.42 mL/min (ref >60.00)
GLUCOSE: 93 mg/dL (ref 70–99)
POTASSIUM: 5.3 meq/L — AB (ref 3.5–5.1)
Sodium: 134 mEq/L — ABNORMAL LOW (ref 135–145)

## 2013-07-06 NOTE — Progress Notes (Signed)
Subjective:    Patient ID: Wendy Nichols, female    DOB: [birth year], 63 y.o.   MRN: [id number]  HPI 63 year old female with past history of hypertension, hypothyroidism, pernicious anemia and hypercholesterolemia who comes in today to follow up on these issues as well as for a complete physical exam.  She states she is doing relatively well.  Has been under increased stress with her husband's health issues.  Feels she is handling things relatively well.  Does not feels she needs anything more.   No chest pain or tightness.  No nausea or vomiting.  Bowels stable.  Blood pressure averaging 120s/70-82.      Past Medical History  Diagnosis Date  . Hypertension   . Hypothyroidism   . Pernicious anemia   . Hypercholesterolemia   . GERD (gastroesophageal reflux disease)   . Osteoporosis   . History of colon polyps     Current Outpatient Prescriptions on File Prior to Visit  Medication Sig Dispense Refill  . aspirin 81 MG tablet Take 81 mg by mouth every other day.      . cyanocobalamin (,VITAMIN B-12,) 1000 MCG/ML injection INJECT 1 ML (1,000 MCG TOTAL) INTO THE MUSCLE EVERY 30 (THIRTY) DAYS.  30 mL  0  . lisinopril (PRINIVIL,ZESTRIL) 10 MG tablet Take 1 tablet (10 mg total) by mouth daily.  90 tablet  1  . metoprolol tartrate (LOPRESSOR) 25 MG tablet Take 0.5 tablets (12.5 mg total) by mouth daily.  45 tablet  1  . simvastatin (ZOCOR) 10 MG tablet Take 1 tablet (10 mg total) by mouth every evening.  90 tablet  1  . SYNTHROID 100 MCG tablet Take 1 tablet (100 mcg total) by mouth daily before breakfast.  90 tablet  1   No current facility-administered medications on file prior to visit.    Review of Systems Patient denies any headache, lightheadedness or dizziness.  No sinus or allergy symptoms.   No chest pain, tightness or palpitations.  No acid reflux.  No increased shortness of breath, cough or congestion.  No nausea or vomiting.  No abdominal pain or cramping.  No bowel change, such as  diarrhea, constipation, BRBPR or melana.  No urine change.  She feels she  Is handling things relatively well.       Objective:   Physical Exam  Filed Vitals:   07/06/13 0834  BP: 110/70  Pulse: 56  Temp: 97.9 F (36.6 C)   Blood pressure recheck:  108/72, pulse 46  63 year old female in no acute distress.   HEENT:  Nares- clear.  Oropharynx - without lesions. NECK:  Supple.  Nontender.  No audible bruit.  HEART:  Appears to be regular. LUNGS:  No crackles or wheezing audible.  Respirations even and unlabored.  RADIAL PULSE:  Equal bilaterally.    BREASTS:  No nipple discharge or nipple retraction present.  Could not appreciate any distinct nodules or axillary adenopathy.  ABDOMEN:  Soft, nontender.  Bowel sounds present and normal.  No audible abdominal bruit.  GU:  Not performed.    EXTREMITIES:  No increased edema present.  DP pulses palpable and equal bilaterally.          Assessment & Plan:  HEALTH MAINTENANCE.  Physical today.   Mammogram 06/24/12 - benign.  Scheduled for mammogram next week.  Colonoscopy 01/09/10 with four 1mm polyps in the sigmoid, descending and ascending colon and internal hemorrhoids.

## 2013-07-06 NOTE — Progress Notes (Signed)
Pre-visit discussion using our clinic review tool. No additional management support is needed unless otherwise documented below in the visit note.  

## 2013-07-07 ENCOUNTER — Other Ambulatory Visit: Payer: Self-pay | Admitting: Internal Medicine

## 2013-07-07 DIAGNOSIS — E871 Hypo-osmolality and hyponatremia: Secondary | ICD-10-CM

## 2013-07-07 DIAGNOSIS — E875 Hyperkalemia: Secondary | ICD-10-CM

## 2013-07-07 NOTE — Progress Notes (Signed)
F/u potassium and sodium ordered.

## 2013-07-09 ENCOUNTER — Encounter: Payer: Self-pay | Admitting: Internal Medicine

## 2013-07-09 DIAGNOSIS — F439 Reaction to severe stress, unspecified: Secondary | ICD-10-CM | POA: Insufficient documentation

## 2013-07-09 NOTE — Assessment & Plan Note (Signed)
On replacement.  Follow tsh.  

## 2013-07-09 NOTE — Assessment & Plan Note (Signed)
Not on Boniva.  Last bone density 01/18/10 revealed osteopenia with no significant change from previous.  Improved from the initial.  Continue calcium and vitamin D.  Check vitamin D level with next labs.  Discussed f/u bone density.  She will notify me when agreeable.

## 2013-07-09 NOTE — Assessment & Plan Note (Signed)
She feels she is handling things relatively well.  Does not feel she needs anything more at this point.  Follow.

## 2013-07-09 NOTE — Assessment & Plan Note (Signed)
Blood pressure under good control.  Follow.  Check metabolic panel.  Same medication regimen.

## 2013-07-09 NOTE — Assessment & Plan Note (Signed)
On simvastatin.  Check lipid panel and liver function.

## 2013-07-09 NOTE — Assessment & Plan Note (Signed)
Colonoscopy as outlined.  Currently asymptomatic.  Follow.

## 2013-07-09 NOTE — Assessment & Plan Note (Signed)
Continue b12 injections.  

## 2013-07-12 ENCOUNTER — Encounter: Payer: Self-pay | Admitting: Internal Medicine

## 2013-07-12 LAB — HM MAMMOGRAPHY

## 2013-07-13 ENCOUNTER — Encounter: Payer: Self-pay | Admitting: Internal Medicine

## 2013-07-13 ENCOUNTER — Other Ambulatory Visit (INDEPENDENT_AMBULATORY_CARE_PROVIDER_SITE_OTHER): Payer: BC Managed Care – PPO

## 2013-07-13 DIAGNOSIS — E871 Hypo-osmolality and hyponatremia: Secondary | ICD-10-CM

## 2013-07-13 DIAGNOSIS — E875 Hyperkalemia: Secondary | ICD-10-CM

## 2013-07-13 LAB — POTASSIUM: POTASSIUM: 4.6 meq/L (ref 3.5–5.1)

## 2013-07-13 LAB — SODIUM: Sodium: 134 mEq/L — ABNORMAL LOW (ref 135–145)

## 2013-07-14 ENCOUNTER — Encounter: Payer: Self-pay | Admitting: *Deleted

## 2013-07-14 ENCOUNTER — Other Ambulatory Visit: Payer: Self-pay | Admitting: Internal Medicine

## 2013-07-14 DIAGNOSIS — E871 Hypo-osmolality and hyponatremia: Secondary | ICD-10-CM

## 2013-07-14 NOTE — Progress Notes (Signed)
Order placed for f/u sodium.  ?

## 2013-09-06 ENCOUNTER — Other Ambulatory Visit: Payer: BC Managed Care – PPO

## 2013-10-21 ENCOUNTER — Other Ambulatory Visit: Payer: Self-pay | Admitting: Internal Medicine

## 2013-10-21 MED ORDER — CYANOCOBALAMIN 1000 MCG/ML IJ SOLN: INTRAMUSCULAR | Status: DC

## 2013-10-21 NOTE — Telephone Encounter (Signed)
Received fax for refill on B 12 refill sent for 1 month.

## 2013-12-14 ENCOUNTER — Telehealth: Payer: Self-pay | Admitting: *Deleted

## 2013-12-14 MED ORDER — "SYRINGE/NEEDLE (DISP) 25G X 1"" 3 ML MISC": Status: DC

## 2013-12-14 NOTE — Telephone Encounter (Signed)
A user error has taken place.

## 2013-12-22 ENCOUNTER — Other Ambulatory Visit: Payer: Self-pay | Admitting: Internal Medicine

## 2014-01-06 ENCOUNTER — Ambulatory Visit (INDEPENDENT_AMBULATORY_CARE_PROVIDER_SITE_OTHER): Payer: BC Managed Care – PPO | Admitting: Internal Medicine

## 2014-01-06 ENCOUNTER — Encounter: Payer: Self-pay | Admitting: Internal Medicine

## 2014-01-06 VITALS — BP 118/72 | HR 53 | Temp 98.0°F | Resp 14 | Wt 136.8 lb

## 2014-01-06 DIAGNOSIS — Z23 Encounter for immunization: Secondary | ICD-10-CM

## 2014-01-06 DIAGNOSIS — D510 Vitamin B12 deficiency anemia due to intrinsic factor deficiency: Secondary | ICD-10-CM

## 2014-01-06 DIAGNOSIS — M810 Age-related osteoporosis without current pathological fracture: Secondary | ICD-10-CM

## 2014-01-06 DIAGNOSIS — Z733 Stress, not elsewhere classified: Secondary | ICD-10-CM

## 2014-01-06 DIAGNOSIS — I10 Essential (primary) hypertension: Secondary | ICD-10-CM

## 2014-01-06 DIAGNOSIS — E039 Hypothyroidism, unspecified: Secondary | ICD-10-CM

## 2014-01-06 DIAGNOSIS — Z86010 Personal history of colonic polyps: Secondary | ICD-10-CM

## 2014-01-06 DIAGNOSIS — F439 Reaction to severe stress, unspecified: Secondary | ICD-10-CM

## 2014-01-06 DIAGNOSIS — E7800 Pure hypercholesterolemia, unspecified: Secondary | ICD-10-CM

## 2014-01-06 LAB — BASIC METABOLIC PANEL
BUN: 14 mg/dL (ref 6–23)
CALCIUM: 9.6 mg/dL (ref 8.4–10.5)
CHLORIDE: 102 meq/L (ref 96–112)
CO2: 24 mEq/L (ref 19–32)
CREATININE: 0.9 mg/dL (ref 0.4–1.2)
GFR: 64.01 mL/min (ref >60.00)
Glucose, Bld: 89 mg/dL (ref 70–99)
Potassium: 5.3 mEq/L — ABNORMAL HIGH (ref 3.5–5.1)
Sodium: 133 mEq/L — ABNORMAL LOW (ref 135–145)

## 2014-01-06 LAB — HEPATIC FUNCTION PANEL
ALBUMIN: 4.4 g/dL (ref 3.5–5.2)
ALK PHOS: 38 U/L — AB (ref 39–117)
ALT: 15 U/L (ref 0–35)
AST: 20 U/L (ref 0–37)
Bilirubin, Direct: 0.1 mg/dL (ref 0.0–0.3)
Total Bilirubin: 0.8 mg/dL (ref 0.2–1.2)
Total Protein: 7 g/dL (ref 6.0–8.3)

## 2014-01-06 LAB — LIPID PANEL
Cholesterol: 178 mg/dL (ref 0–200)
HDL: 71.9 mg/dL (ref >39.00)
LDL Cholesterol: 94 mg/dL (ref 0–99)
NONHDL: 106.1
Total CHOL/HDL Ratio: 2
Triglycerides: 63 mg/dL (ref 0.0–149.0)
VLDL: 12.6 mg/dL (ref 0.0–40.0)

## 2014-01-06 LAB — TSH: TSH: 0.83 u[IU]/mL (ref 0.35–4.50)

## 2014-01-06 NOTE — Progress Notes (Signed)
Subjective:    Patient ID: Wendy Nichols, female    DOB: 1950-08-11, 63 y.o.   MRN: [id number]  HPI 63 year old female with past history of hypertension, hypothyroidism, pernicious anemia and hypercholesterolemia who comes in today for a scheduled follow up.   She states she is doing relatively well.  Has been under increased stress with her husband's health issues.  Feels she is handling things relatively well.  Does not feel she needs anything more.   No chest pain or tightness.  No nausea or vomiting.  Bowels stable.  Blood pressure averaging 120s/70-80s.     Past Medical History  Diagnosis Date  . Hypertension   . Hypothyroidism   . Pernicious anemia   . Hypercholesterolemia   . GERD (gastroesophageal reflux disease)   . Osteoporosis   . History of colon polyps     Current Outpatient Prescriptions on File Prior to Visit  Medication Sig Dispense Refill  . aspirin 81 MG tablet Take 81 mg by mouth every other day.      . cyanocobalamin (,VITAMIN B-12,) 1000 MCG/ML injection INJECT 1 ML (1,000 MCG TOTAL) INTO THE MUSCLE EVERY 30 (THIRTY) DAYS.  30 mL  0  . lisinopril (PRINIVIL,ZESTRIL) 10 MG tablet Take 1 tablet (10 mg total) by mouth daily.  90 tablet  1  . metoprolol tartrate (LOPRESSOR) 25 MG tablet Take 0.5 tablets (12.5 mg total) by mouth daily.  45 tablet  1  . simvastatin (ZOCOR) 10 MG tablet Take 1 tablet (10 mg total) by mouth every evening.  90 tablet  1  . SYNTHROID 100 MCG tablet TAKE 1 TABLET BY MOUTH DAILY.  90 tablet  1  . SYRINGE-NEEDLE, DISP, 3 ML 25G X 1" 3 ML MISC Dose and route does not apply.  To be used to inject 1064mcg/mL Cyanocobalamin IM once every 30 days  50 each  0   No current facility-administered medications on file prior to visit.    Review of Systems Patient denies any headache, lightheadedness or dizziness.  No sinus or allergy symptoms.   No chest pain, tightness or palpitations.  No acid reflux.  No increased shortness of breath, cough or  congestion.  No nausea or vomiting.  No abdominal pain or cramping.  No bowel change, such as diarrhea, constipation, BRBPR or melana.  No urine change.  She feels she  Is handling things relatively well.  Does not feel she needs anything more at this time.       Objective:   Physical Exam  Filed Vitals:   01/06/14 0831  BP: 118/72  Pulse: 53  Temp: 98 F (36.7 C)  Resp: 14   Blood pressure recheck:  53/8  63 year old female in no acute distress.   HEENT:  Nares- clear.  Oropharynx - without lesions. NECK:  Supple.  Nontender.  No audible bruit.  HEART:  Appears to be regular. LUNGS:  No crackles or wheezing audible.  Respirations even and unlabored.  RADIAL PULSE:  Equal bilaterally.  ABDOMEN:  Soft, nontender.  Bowel sounds present and normal.  No audible abdominal bruit.   EXTREMITIES:  No increased edema present.  DP pulses palpable and equal bilaterally.          Assessment & Plan:  HEALTH MAINTENANCE.  Physical 07/06/13.   Mammogram 07/12/13 - benign.  Colonoscopy 01/09/10 with four 1mm polyps in the sigmoid, descending and ascending colon and internal hemorrhoids.

## 2014-01-06 NOTE — Progress Notes (Signed)
Pre visit review using our clinic review tool, if applicable. No additional management support is needed unless otherwise documented below in the visit note. 

## 2014-01-08 ENCOUNTER — Encounter: Payer: Self-pay | Admitting: Internal Medicine

## 2014-01-08 ENCOUNTER — Other Ambulatory Visit: Payer: Self-pay | Admitting: Internal Medicine

## 2014-01-08 DIAGNOSIS — E878 Other disorders of electrolyte and fluid balance, not elsewhere classified: Secondary | ICD-10-CM

## 2014-01-08 MED ORDER — SIMVASTATIN 10 MG PO TABS: 10.0000 mg | ORAL_TABLET | Freq: Every evening | ORAL | Status: DC

## 2014-01-08 NOTE — Assessment & Plan Note (Signed)
Colonoscopy as outlined.  Currently asymptomatic.  Follow.

## 2014-01-08 NOTE — Assessment & Plan Note (Signed)
She feels she is handling things relatively well.  Does not feel she needs anything more at this point.  Follow.

## 2014-01-08 NOTE — Assessment & Plan Note (Signed)
Not on Boniva.  Last bone density 01/18/10 revealed osteopenia with no significant change from previous.  Improved from the initial.  Continue calcium and vitamin D.  Check vitamin D level with next labs.  Have discussed f/u bone density.  She will notify me when agreeable.

## 2014-01-08 NOTE — Assessment & Plan Note (Signed)
On replacement.  Follow tsh.  

## 2014-01-08 NOTE — Assessment & Plan Note (Signed)
Continue b12 injections.  

## 2014-01-08 NOTE — Assessment & Plan Note (Signed)
On simvastatin.  Check lipid panel and liver function.

## 2014-01-08 NOTE — Assessment & Plan Note (Signed)
Blood pressure under good control.  Follow.  Check metabolic panel.  Same medication regimen.

## 2014-01-08 NOTE — Progress Notes (Signed)
Order placed for f/u sodium and potassium.   

## 2014-01-09 ENCOUNTER — Other Ambulatory Visit: Payer: Self-pay | Admitting: Internal Medicine

## 2014-01-12 ENCOUNTER — Other Ambulatory Visit: Payer: BC Managed Care – PPO

## 2014-01-12 ENCOUNTER — Other Ambulatory Visit: Payer: Self-pay | Admitting: *Deleted

## 2014-01-12 MED ORDER — LISINOPRIL 10 MG PO TABS
10.0000 mg | ORAL_TABLET | Freq: Every day | ORAL | Status: DC
Start: 2014-01-12 — End: 2014-08-01

## 2014-01-12 MED ORDER — LEVOTHYROXINE SODIUM 100 MCG PO TABS: ORAL_TABLET | ORAL | Status: DC

## 2014-01-12 MED ORDER — METOPROLOL TARTRATE 25 MG PO TABS: 12.5000 mg | ORAL_TABLET | Freq: Every day | ORAL | Status: DC

## 2014-01-12 MED ORDER — CYANOCOBALAMIN 1000 MCG/ML IJ SOLN: INTRAMUSCULAR | Status: DC

## 2014-01-18 ENCOUNTER — Other Ambulatory Visit: Payer: Self-pay | Admitting: Internal Medicine

## 2014-02-13 ENCOUNTER — Telehealth: Payer: Self-pay | Admitting: Internal Medicine

## 2014-02-16 ENCOUNTER — Other Ambulatory Visit (INDEPENDENT_AMBULATORY_CARE_PROVIDER_SITE_OTHER): Payer: BC Managed Care – PPO

## 2014-02-16 DIAGNOSIS — E878 Other disorders of electrolyte and fluid balance, not elsewhere classified: Secondary | ICD-10-CM

## 2014-02-16 LAB — SODIUM: Sodium: 137 mEq/L (ref 135–145)

## 2014-02-16 LAB — POTASSIUM: POTASSIUM: 5.2 meq/L — AB (ref 3.5–5.1)

## 2014-03-01 ENCOUNTER — Telehealth: Payer: Self-pay | Admitting: Internal Medicine

## 2014-03-01 ENCOUNTER — Encounter: Payer: Self-pay | Admitting: Internal Medicine

## 2014-03-01 ENCOUNTER — Ambulatory Visit: Payer: Self-pay | Admitting: Internal Medicine

## 2014-03-01 LAB — BASIC METABOLIC PANEL: Potassium: 4.5 mmol/L (ref 3.4–5.3)

## 2014-03-01 LAB — POTASSIUM: Potassium: 4.5 mmol/L (ref 3.5–5.1)

## 2014-03-01 NOTE — Telephone Encounter (Signed)
Notify pt that I received her potassium result from the hospital and potassium is wnl.

## 2014-03-02 NOTE — Telephone Encounter (Signed)
Called and advised patient of results

## 2014-03-17 ENCOUNTER — Encounter: Payer: Self-pay | Admitting: Internal Medicine

## 2014-07-03 ENCOUNTER — Other Ambulatory Visit: Payer: Self-pay | Admitting: Internal Medicine

## 2014-07-03 ENCOUNTER — Other Ambulatory Visit (INDEPENDENT_AMBULATORY_CARE_PROVIDER_SITE_OTHER): Payer: BLUE CROSS/BLUE SHIELD

## 2014-07-03 DIAGNOSIS — I10 Essential (primary) hypertension: Secondary | ICD-10-CM

## 2014-07-03 DIAGNOSIS — E7800 Pure hypercholesterolemia, unspecified: Secondary | ICD-10-CM

## 2014-07-03 DIAGNOSIS — M810 Age-related osteoporosis without current pathological fracture: Secondary | ICD-10-CM

## 2014-07-03 DIAGNOSIS — E039 Hypothyroidism, unspecified: Secondary | ICD-10-CM

## 2014-07-03 DIAGNOSIS — E871 Hypo-osmolality and hyponatremia: Secondary | ICD-10-CM

## 2014-07-03 LAB — SODIUM: Sodium: 137 mEq/L (ref 135–145)

## 2014-07-03 NOTE — Progress Notes (Signed)
Orders placed for labs

## 2014-07-03 NOTE — Addendum Note (Signed)
Addended by: Cristela BluePULLIAM, Sunny Aguon W on: 07/03/2014 08:09 AM   Modules accepted: Orders

## 2014-07-04 ENCOUNTER — Other Ambulatory Visit (INDEPENDENT_AMBULATORY_CARE_PROVIDER_SITE_OTHER): Payer: BLUE CROSS/BLUE SHIELD

## 2014-07-04 ENCOUNTER — Telehealth: Payer: Self-pay | Admitting: Internal Medicine

## 2014-07-04 DIAGNOSIS — E039 Hypothyroidism, unspecified: Secondary | ICD-10-CM | POA: Diagnosis not present

## 2014-07-04 DIAGNOSIS — M810 Age-related osteoporosis without current pathological fracture: Secondary | ICD-10-CM

## 2014-07-04 DIAGNOSIS — E7800 Pure hypercholesterolemia, unspecified: Secondary | ICD-10-CM

## 2014-07-04 DIAGNOSIS — I10 Essential (primary) hypertension: Secondary | ICD-10-CM | POA: Diagnosis not present

## 2014-07-04 DIAGNOSIS — I519 Heart disease, unspecified: Secondary | ICD-10-CM

## 2014-07-04 LAB — BASIC METABOLIC PANEL
BUN: 13 mg/dL (ref 6–23)
CO2: 25 mEq/L (ref 19–32)
Calcium: 9.4 mg/dL (ref 8.4–10.5)
Chloride: 105 mEq/L (ref 96–112)
Creatinine, Ser: 0.98 mg/dL (ref 0.40–1.20)
GFR: 60.91 mL/min (ref >60.00)
Glucose, Bld: 99 mg/dL (ref 70–99)
Potassium: 5 mEq/L (ref 3.5–5.1)
SODIUM: 137 meq/L (ref 135–145)

## 2014-07-04 LAB — LIPID PANEL
CHOL/HDL RATIO: 2
Cholesterol: 168 mg/dL (ref 0–200)
HDL: 78.1 mg/dL (ref >39.00)
LDL CALC: 80 mg/dL (ref 0–99)
NonHDL: 89.9
TRIGLYCERIDES: 51 mg/dL (ref 0.0–149.0)
VLDL: 10.2 mg/dL (ref 0.0–40.0)

## 2014-07-04 LAB — HEPATIC FUNCTION PANEL
ALBUMIN: 4.3 g/dL (ref 3.5–5.2)
ALK PHOS: 40 U/L (ref 39–117)
ALT: 19 U/L (ref 0–35)
AST: 19 U/L (ref 0–37)
BILIRUBIN TOTAL: 0.6 mg/dL (ref 0.2–1.2)
Bilirubin, Direct: 0.1 mg/dL (ref 0.0–0.3)
TOTAL PROTEIN: 6.9 g/dL (ref 6.0–8.3)

## 2014-07-04 LAB — VITAMIN D 25 HYDROXY (VIT D DEFICIENCY, FRACTURES): VITD: 29.31 ng/mL — ABNORMAL LOW (ref 30.00–100.00)

## 2014-07-04 LAB — TSH: TSH: 1.1 u[IU]/mL (ref 0.35–4.50)

## 2014-07-04 NOTE — Telephone Encounter (Signed)
Did Wendy JonesCarolyn call her?  I did not.

## 2014-07-04 NOTE — Telephone Encounter (Signed)
Did you call this patient? Please advise

## 2014-07-04 NOTE — Telephone Encounter (Signed)
Patient is returning call for lab work.

## 2014-07-04 NOTE — Telephone Encounter (Signed)
i did not all pt?

## 2014-07-05 NOTE — Telephone Encounter (Signed)
Unsure who called.  It may have been Melissa - changing her appt.  This appears to have already been changed.  Please notify her that her cholesterol looks good.  Kidney function tests, thyroid test and liver function tests are within normal limits.  Vitamin d level is slightly decreased.  If she is not taking any vitamin D, then I would recommend vitamin D3 1000 q day.  If already on this dose, then increase vitamin D3 to 2000 q day.  Will follow.

## 2014-07-06 NOTE — Telephone Encounter (Signed)
Left message for pt to return my call.

## 2014-07-07 ENCOUNTER — Other Ambulatory Visit: Payer: BC Managed Care – PPO

## 2014-07-10 ENCOUNTER — Encounter: Payer: BC Managed Care – PPO | Admitting: Internal Medicine

## 2014-07-10 NOTE — Telephone Encounter (Signed)
Pt notifed of lab results.

## 2014-07-13 ENCOUNTER — Other Ambulatory Visit: Payer: Self-pay | Admitting: Internal Medicine

## 2014-07-17 LAB — HM MAMMOGRAPHY

## 2014-07-18 ENCOUNTER — Encounter: Payer: Self-pay | Admitting: Internal Medicine

## 2014-08-01 ENCOUNTER — Encounter: Payer: Self-pay | Admitting: Internal Medicine

## 2014-08-01 ENCOUNTER — Ambulatory Visit (INDEPENDENT_AMBULATORY_CARE_PROVIDER_SITE_OTHER): Payer: BLUE CROSS/BLUE SHIELD | Admitting: Internal Medicine

## 2014-08-01 VITALS — BP 120/78 | HR 58 | Temp 97.8°F | Ht 64.5 in | Wt 145.5 lb

## 2014-08-01 DIAGNOSIS — Z86010 Personal history of colonic polyps: Secondary | ICD-10-CM

## 2014-08-01 DIAGNOSIS — E039 Hypothyroidism, unspecified: Secondary | ICD-10-CM | POA: Diagnosis not present

## 2014-08-01 DIAGNOSIS — Z0000 Encounter for general adult medical examination without abnormal findings: Secondary | ICD-10-CM | POA: Diagnosis not present

## 2014-08-01 DIAGNOSIS — I10 Essential (primary) hypertension: Secondary | ICD-10-CM | POA: Diagnosis not present

## 2014-08-01 DIAGNOSIS — E7800 Pure hypercholesterolemia, unspecified: Secondary | ICD-10-CM

## 2014-08-01 DIAGNOSIS — M810 Age-related osteoporosis without current pathological fracture: Secondary | ICD-10-CM | POA: Diagnosis not present

## 2014-08-01 DIAGNOSIS — F439 Reaction to severe stress, unspecified: Secondary | ICD-10-CM

## 2014-08-01 DIAGNOSIS — D510 Vitamin B12 deficiency anemia due to intrinsic factor deficiency: Secondary | ICD-10-CM

## 2014-08-01 DIAGNOSIS — Z658 Other specified problems related to psychosocial circumstances: Secondary | ICD-10-CM

## 2014-08-01 MED ORDER — LEVOTHYROXINE SODIUM 100 MCG PO TABS: ORAL_TABLET | ORAL | Status: DC

## 2014-08-01 MED ORDER — LISINOPRIL 10 MG PO TABS: ORAL_TABLET | ORAL | Status: DC

## 2014-08-01 MED ORDER — SIMVASTATIN 10 MG PO TABS: 10.0000 mg | ORAL_TABLET | Freq: Every evening | ORAL | Status: DC

## 2014-08-01 MED ORDER — METOPROLOL TARTRATE 25 MG PO TABS: ORAL_TABLET | ORAL | Status: DC

## 2014-08-01 NOTE — Progress Notes (Signed)
Patient ID: Wendy Nichols, female   DOB: 1950-07-19, 64 y.o.   MRN: [id number]   Subjective:    Patient ID: Wendy Nichols, female    DOB: [birth year], 64 y.o.   MRN: [id number]  HPI  Patient here for her physical exam.   She reports increased stress with her husband's medical issues.  His cancer has spread.  She feels she is coping relatively well.  Eating and drinking well.  No cardiac symptoms with increased activity or exertion.  Bowels stable.  No nausea or vomiting.     Past Medical History  Diagnosis Date  . Hypertension   . Hypothyroidism   . Pernicious anemia   . Hypercholesterolemia   . GERD (gastroesophageal reflux disease)   . Osteoporosis   . History of colon polyps     Review of Systems  Constitutional: Negative for appetite change and unexpected weight change.  HENT: Negative for congestion and sinus pressure.   Eyes: Negative for pain and visual disturbance.  Respiratory: Negative for cough, chest tightness and shortness of breath.   Cardiovascular: Negative for chest pain, palpitations and leg swelling.  Gastrointestinal: Negative for nausea, vomiting, abdominal pain and diarrhea.  Genitourinary: Negative for dysuria and difficulty urinating.  Musculoskeletal: Negative for back pain and joint swelling.  Skin: Negative for color change and rash.  Neurological: Negative for dizziness, light-headedness and headaches.  Hematological: Negative for adenopathy. Does not bruise/bleed easily.  Psychiatric/Behavioral: Negative for agitation.       Increased stress with her husband's health issues.         Objective:     Blood pressure recheck:  120/72  Physical Exam  Constitutional: She is oriented to person, place, and time. She appears well-developed and well-nourished.  HENT:  Nose: Nose normal.  Mouth/Throat: Oropharynx is clear and moist.  Eyes: Right eye exhibits no discharge. Left eye exhibits no discharge. No scleral icterus.  Neck: Neck supple. No  thyromegaly present.  Cardiovascular: Normal rate and regular rhythm.   Pulmonary/Chest: Breath sounds normal. No accessory muscle usage. No tachypnea. No respiratory distress. She has no decreased breath sounds. She has no wheezes. She has no rhonchi. Right breast exhibits no inverted nipple, no mass, no nipple discharge and no tenderness (no axillary adenopathy). Left breast exhibits no inverted nipple, no mass, no nipple discharge and no tenderness (no axilarry adenopathy).  Abdominal: Soft. Bowel sounds are normal. There is no tenderness.  Musculoskeletal: She exhibits no edema or tenderness.  Lymphadenopathy:    She has no cervical adenopathy.  Neurological: She is alert and oriented to person, place, and time.  Skin: Skin is warm. No rash noted.  Psychiatric: She has a normal mood and affect. Her behavior is normal.    BP 120/78 mmHg  Pulse 58  Temp(Src) 97.8 F (36.6 C) (Oral)  Ht 5' 4.5" (1.638 m)  Wt 145 lb 8 oz (65.998 kg)  BMI 24.60 kg/m2  SpO2 100%  LMP 03/25/1996 Wt Readings from Last 3 Encounters:  08/01/14 145 lb 8 oz (65.998 kg)  01/06/14 136 lb 12 oz (62.029 kg)  07/06/13 138 lb 12 oz (62.937 kg)     Lab Results  Component Value Date   WBC 5.1 07/06/2013   HGB 12.9 07/06/2013   HCT 38.4 07/06/2013   PLT 261.0 07/06/2013   GLUCOSE 99 07/04/2014   CHOL 168 07/04/2014   TRIG 51.0 07/04/2014   HDL 78.10 07/04/2014   LDLCALC 80 07/04/2014   ALT 19 07/04/2014  AST 19 07/04/2014   NA 137 07/04/2014   K 5.0 07/04/2014   CL 105 07/04/2014   CREATININE 0.98 07/04/2014   BUN 13 07/04/2014   CO2 25 07/04/2014   TSH 1.10 07/04/2014       Assessment & Plan:   Problem List Items Addressed This Visit    Health care maintenance    Physical 08/01/14.  Is s/p hysterectomy.  Mammogram 07/18/14 - ok.  Colonoscopy 01/09/10 as outlined.        History of colonic polyps    Colonoscopy 12/2009 with four one mm polyps in the ascending, descending and sigmoid colon and  internal hemorrhoids.        Hypercholesterolemia    On simvastatin.  Low cholesterol diet and exercise.  Follow lipid panel and liver function tests.        Relevant Medications   lisinopril (PRINIVIL,ZESTRIL) 10 MG tablet   metoprolol tartrate (LOPRESSOR) 25 MG tablet   simvastatin (ZOCOR) 10 MG tablet   Hypertension - Primary    Blood pressure doing well.  Same medication regimen.  Follow pressures.  Follow metabolic panel.        Relevant Medications   lisinopril (PRINIVIL,ZESTRIL) 10 MG tablet   metoprolol tartrate (LOPRESSOR) 25 MG tablet   simvastatin (ZOCOR) 10 MG tablet   Hypothyroidism    On thyroid replacement.  Follow tsh.       Relevant Medications   levothyroxine (SYNTHROID) 100 MCG tablet   metoprolol tartrate (LOPRESSOR) 25 MG tablet   Osteoporosis    Continue calcium and vitamin D.  Follow vitamin D level.  Will notify me when agreeable for f/u bone denstiy.        Pernicious anemia    Continue B12 injections.        Stress    Increased stress as outlined.  Discussed with her today.  She feels she is coping relatively well.  Follow.           Dale [place], MD

## 2014-08-01 NOTE — Patient Instructions (Signed)
Take vitamin D3 1000 units per day.   

## 2014-08-01 NOTE — Progress Notes (Signed)
Pre visit review using our clinic review tool, if applicable. No additional management support is needed unless otherwise documented below in the visit note. 

## 2014-08-13 ENCOUNTER — Encounter: Payer: Self-pay | Admitting: Internal Medicine

## 2014-08-13 DIAGNOSIS — Z0000 Encounter for general adult medical examination without abnormal findings: Secondary | ICD-10-CM | POA: Insufficient documentation

## 2014-08-13 NOTE — Assessment & Plan Note (Signed)
Blood pressure doing well.  Same medication regimen.  Follow pressures.  Follow metabolic panel.   

## 2014-08-13 NOTE — Assessment & Plan Note (Signed)
Physical 08/01/14.  Is s/p hysterectomy.  Mammogram 07/18/14 - ok.  Colonoscopy 01/09/10 as outlined.

## 2014-08-13 NOTE — Assessment & Plan Note (Signed)
On thyroid replacement.  Follow tsh.  

## 2014-08-13 NOTE — Assessment & Plan Note (Signed)
On simvastatin.  Low cholesterol diet and exercise.  Follow lipid panel and liver function tests.   

## 2014-08-13 NOTE — Assessment & Plan Note (Signed)
Increased stress as outlined.  Discussed with her today.  She feels she is coping relatively well.  Follow.

## 2014-08-13 NOTE — Assessment & Plan Note (Signed)
Colonoscopy 12/2009 with four one mm polyps in the ascending, descending and sigmoid colon and internal hemorrhoids.   

## 2014-08-13 NOTE — Assessment & Plan Note (Signed)
Continue B12 injections.   

## 2014-08-13 NOTE — Assessment & Plan Note (Signed)
Continue calcium and vitamin D.  Follow vitamin D level.  Will notify me when agreeable for f/u bone denstiy.

## 2014-09-29 ENCOUNTER — Telehealth: Payer: Self-pay | Admitting: *Deleted

## 2014-09-29 NOTE — Telephone Encounter (Signed)
Pt called requesting medication for anxiety.  Last Ov 4.19.16.  Please advise

## 2014-09-29 NOTE — Telephone Encounter (Signed)
Left message on VM to return call 

## 2014-09-29 NOTE — Telephone Encounter (Signed)
Has she taken anything previously?  Just need to see if tolerated.  Also need to know if she is doing ok?  Does she feel needs to be seen?

## 2014-10-02 MED ORDER — ALPRAZOLAM 0.25 MG PO TABS: 0.2500 mg | ORAL_TABLET | Freq: Every day | ORAL | Status: DC | PRN

## 2014-10-02 NOTE — Telephone Encounter (Signed)
I have ok'd prescription for xanax .25mg  daily prn #20 with no refills.  May need to call in since I am not in office this week.  Tell her to let us know if she needs anything.

## 2014-10-02 NOTE — Telephone Encounter (Signed)
Spoke with pt, she states she has taken Xanax before during the death of her parents, she tolerated this well.  Pt further states she wants something to take PRN while caring for her husband.  Please advise

## 2014-10-02 NOTE — Telephone Encounter (Signed)
Left message on VM that Rx ready for pick up

## 2014-10-18 ENCOUNTER — Other Ambulatory Visit: Payer: Self-pay | Admitting: *Deleted

## 2014-10-18 MED ORDER — CYANOCOBALAMIN 1000 MCG/ML IJ SOLN: INTRAMUSCULAR | Status: DC

## 2014-12-28 ENCOUNTER — Other Ambulatory Visit: Payer: Self-pay | Admitting: Internal Medicine

## 2015-01-16 ENCOUNTER — Telehealth: Payer: Self-pay

## 2015-01-16 NOTE — Telephone Encounter (Signed)
Per 08/01/14 office note under f/u and disposition - note was to schedule a f/u appt in 6 months.  (this would be around 01/31/15).  I would like for her to have f/u scheduled around this time ( ).  Ok to place in one of the hold spots.

## 2015-01-16 NOTE — Telephone Encounter (Signed)
Patient called triage line, wants to know if she should schedule a 6 month follow up with you.  At Last OV on 08/01/14, there is no follow up listed.  If you want [place] to schedule, please advise on labs also?

## 2015-01-16 NOTE — Telephone Encounter (Signed)
Attempted to call patient back, unable to leave a message.  

## 2015-01-17 NOTE — Telephone Encounter (Signed)
Tried to return a call to patient, not able to leave a message

## 2015-01-24 ENCOUNTER — Other Ambulatory Visit: Payer: Self-pay | Admitting: Internal Medicine

## 2015-02-07 ENCOUNTER — Ambulatory Visit: Payer: BLUE CROSS/BLUE SHIELD | Admitting: Internal Medicine

## 2015-02-20 ENCOUNTER — Encounter: Payer: Self-pay | Admitting: Internal Medicine

## 2015-02-20 ENCOUNTER — Ambulatory Visit (INDEPENDENT_AMBULATORY_CARE_PROVIDER_SITE_OTHER): Payer: BLUE CROSS/BLUE SHIELD | Admitting: Internal Medicine

## 2015-02-20 VITALS — BP 110/70 | HR 61 | Temp 97.9°F | Resp 18 | Ht 64.5 in | Wt 144.0 lb

## 2015-02-20 DIAGNOSIS — I10 Essential (primary) hypertension: Secondary | ICD-10-CM | POA: Diagnosis not present

## 2015-02-20 DIAGNOSIS — Z658 Other specified problems related to psychosocial circumstances: Secondary | ICD-10-CM

## 2015-02-20 DIAGNOSIS — Z23 Encounter for immunization: Secondary | ICD-10-CM | POA: Diagnosis not present

## 2015-02-20 DIAGNOSIS — E7800 Pure hypercholesterolemia, unspecified: Secondary | ICD-10-CM

## 2015-02-20 DIAGNOSIS — M810 Age-related osteoporosis without current pathological fracture: Secondary | ICD-10-CM

## 2015-02-20 DIAGNOSIS — E039 Hypothyroidism, unspecified: Secondary | ICD-10-CM | POA: Diagnosis not present

## 2015-02-20 DIAGNOSIS — D510 Vitamin B12 deficiency anemia due to intrinsic factor deficiency: Secondary | ICD-10-CM

## 2015-02-20 DIAGNOSIS — Z86010 Personal history of colonic polyps: Secondary | ICD-10-CM | POA: Diagnosis not present

## 2015-02-20 DIAGNOSIS — F439 Reaction to severe stress, unspecified: Secondary | ICD-10-CM

## 2015-02-20 DIAGNOSIS — G479 Sleep disorder, unspecified: Secondary | ICD-10-CM

## 2015-02-20 MED ORDER — TRAZODONE HCL 50 MG PO TABS: 25.0000 mg | ORAL_TABLET | Freq: Every evening | ORAL | Status: DC | PRN

## 2015-02-20 NOTE — Progress Notes (Signed)
Pre-visit discussion using our clinic review tool. No additional management support is needed unless otherwise documented below in the visit note.  

## 2015-02-20 NOTE — Progress Notes (Signed)
Patient ID: CAZANDRA TURO, female   DOB: 02-Jan-1951, 64 y.o.   MRN: [id number]   Subjective:    Patient ID: [name], female    DOB: [birth year], 64 y.o.   MRN: [id number]  HPI  Patient with past history of hypercholesterolemia, hypothyroidism and hypertension.  She comes in today to follow up on these issues.  Increased stress.  Her husband recently passed.  She has good support.  Does not feel she needs any further intervention at this time.  Tries to stay active.  No cardiac symptoms with increased activity or exertion.  No sob.  No acid reflux reported.  No abdominal pain or cramping.  Bowels stable.     Past Medical History  Diagnosis Date  . Hypertension   . Hypothyroidism   . Pernicious anemia   . Hypercholesterolemia   . GERD (gastroesophageal reflux disease)   . Osteoporosis   . History of colon polyps    Past Surgical History  Procedure Laterality Date  . Abdominal hysterectomy  1997    abnormal uterine bleeding.   . Breast biopsy      biopsies in both breasts  . Dilation and curettage of uterus  1979   Family History  Problem Relation Age of Onset  . Breast cancer Mother   . Kidney disease Mother   . Hypertension Mother   . Heart disease Mother   . Heart disease Father     myocardial infarction  . Breast cancer Maternal Aunt   . Breast cancer Maternal Grandmother   . Heart disease Brother     myocardial infarction  . Brain cancer Brother    Social History   Social History  . Marital Status: Married    Spouse Name: N/A  . Number of Children: 3  . Years of Education: N/A   Occupational History  .     Social History Main Topics  . Smoking status: Never Smoker   . Smokeless tobacco: Never Used  . Alcohol Use: 0.0 oz/week    0 Standard drinks or equivalent per week     Comment: occasional wine  . Drug Use: No  . Sexual Activity: Not Asked   Other Topics Concern  . None   Social History Narrative    Outpatient Encounter Prescriptions as  of 02/20/2015  Medication Sig  . ALPRAZolam (XANAX) 0.25 MG tablet Take 1 tablet (0.25 mg total) by mouth daily as needed for anxiety.  Marland Kitchen aspirin 81 MG tablet Take 81 mg by mouth every other day.  . cyanocobalamin (,VITAMIN B-12,) 1000 MCG/ML injection INJECT 1 ML (1,000 MCG TOTAL) INTO THE MUSCLE EVERY 30 (THIRTY) DAYS.  Marland Kitchen levothyroxine (SYNTHROID) 100 MCG tablet TAKE 1 TABLET BY MOUTH DAILY.  Marland Kitchen lisinopril (PRINIVIL,ZESTRIL) 10 MG tablet TAKE 1 TABLET (10 MG TOTAL) BY MOUTH DAILY.  . metoprolol tartrate (LOPRESSOR) 25 MG tablet TAKE 0.5 TABLETS BY MOUTH DAILY.  . simvastatin (ZOCOR) 10 MG tablet Take 1 tablet (10 mg total) by mouth every evening.  Marland Kitchen SYRINGE-NEEDLE, DISP, 3 ML 25G X 1" 3 ML MISC Dose and route does not apply.  To be used to inject 1050mcg/mL Cyanocobalamin IM once every 30 days  . traZODone (DESYREL) 50 MG tablet Take 0.5-1 tablets (25-50 mg total) by mouth at bedtime as needed for sleep.   No facility-administered encounter medications on file as of 02/20/2015.    Review of Systems  Constitutional: Negative for appetite change and unexpected weight change.  HENT: Negative for congestion and  sinus pressure.   Eyes: Negative for pain and discharge.  Respiratory: Negative for cough, chest tightness and shortness of breath.   Cardiovascular: Negative for chest pain, palpitations and leg swelling.  Gastrointestinal: Negative for nausea, vomiting, abdominal pain and diarrhea.  Genitourinary: Negative for dysuria and difficulty urinating.  Musculoskeletal: Negative for back pain and joint swelling.  Skin: Negative for color change and rash.  Neurological: Negative for dizziness, light-headedness and headaches.  Psychiatric/Behavioral: Negative for dysphoric mood and agitation.       Objective:    Physical Exam  Constitutional: She appears well-developed and well-nourished. No distress.  HENT:  Nose: Nose normal.  Mouth/Throat: Oropharynx is clear and moist.  Eyes:  Conjunctivae are normal. Right eye exhibits no discharge. Left eye exhibits no discharge.  Neck: Neck supple. No thyromegaly present.  Cardiovascular: Normal rate and regular rhythm.   Pulmonary/Chest: Breath sounds normal. No respiratory distress. She has no wheezes.  Abdominal: Soft. Bowel sounds are normal. There is no tenderness.  Musculoskeletal: She exhibits no edema or tenderness.  Lymphadenopathy:    She has no cervical adenopathy.  Skin: No rash noted. No erythema.  Psychiatric: She has a normal mood and affect. Her behavior is normal.    BP 110/70 mmHg  Pulse 61  Temp(Src) 97.9 F (36.6 C) (Oral)  Resp 18  Ht 5' 4.5" (1.638 m)  Wt 144 lb (65.318 kg)  BMI 24.34 kg/m2  SpO2 99%  LMP 03/25/1996 Wt Readings from Last 3 Encounters:  02/20/15 144 lb (65.318 kg)  08/01/14 145 lb 8 oz (65.998 kg)  01/06/14 136 lb 12 oz (62.029 kg)     Lab Results  Component Value Date   WBC 5.1 07/06/2013   HGB 12.9 07/06/2013   HCT 38.4 07/06/2013   PLT 261.0 07/06/2013   GLUCOSE 99 07/04/2014   CHOL 168 07/04/2014   TRIG 51.0 07/04/2014   HDL 78.10 07/04/2014   LDLCALC 80 07/04/2014   ALT 19 07/04/2014   AST 19 07/04/2014   NA 137 07/04/2014   K 5.0 07/04/2014   CL 105 07/04/2014   CREATININE 0.98 07/04/2014   BUN 13 07/04/2014   CO2 25 07/04/2014   TSH 1.10 07/04/2014       Assessment & Plan:   Problem List Items Addressed This Visit    History of colonic polyps    Colonoscopy 12/2009 with four one mm polyps in the ascending, descending and sigmoid colon and internal hemorrhoids.        Hypercholesterolemia    Low cholesterol diet and exercise.  Follow lipid panel and liver function tests.  On simvastatin.        Relevant Orders   Lipid panel   Hepatic function panel   Hypertension    Blood pressure under good control.  Continue same medication regimen.  Follow pressures.  Follow metabolic panel.        Relevant Orders   Basic metabolic panel    Hypothyroidism    On thyroid replacement.  Follow tsh.       Osteoporosis    Continue calcium and vitamin D.  Weight bearing exercise.  Have discussed bone density.  See last note.        Pernicious anemia    Continue B12 injections.        Relevant Orders   CBC with Differential/Platelet   Sleeping difficulty    Discussed with her today.  Discussed treatment options.  Start trazodone as outlined.  Follow.  Stress    Increased stress.  Her husband recently passed.  Has good support.  Not sleeping well.  Discussed treatment options.  Start trazodone as directed. Follow.  Get her back in soon to reassess.         Other Visit Diagnoses    Encounter for immunization    -  Primary        Dale [place], MD

## 2015-02-25 ENCOUNTER — Encounter: Payer: Self-pay | Admitting: Internal Medicine

## 2015-02-25 DIAGNOSIS — G479 Sleep disorder, unspecified: Secondary | ICD-10-CM | POA: Insufficient documentation

## 2015-02-25 NOTE — Assessment & Plan Note (Signed)
Colonoscopy 12/2009 with four one mm polyps in the ascending, descending and sigmoid colon and internal hemorrhoids.

## 2015-02-25 NOTE — Assessment & Plan Note (Signed)
Low cholesterol diet and exercise.  Follow lipid panel and liver function tests.  On simvastatin.   

## 2015-02-25 NOTE — Assessment & Plan Note (Signed)
Continue calcium and vitamin D.  Weight bearing exercise.  Have discussed bone density.  See last note.

## 2015-02-25 NOTE — Assessment & Plan Note (Signed)
Continue B12 injections.   

## 2015-02-25 NOTE — Assessment & Plan Note (Signed)
Increased stress.  Her husband recently passed.  Has good support.  Not sleeping well.  Discussed treatment options.  Start trazodone as directed. Follow.  Get her back in soon to reassess.

## 2015-02-25 NOTE — Assessment & Plan Note (Signed)
Discussed with her today.  Discussed treatment options.  Start trazodone as outlined.  Follow.

## 2015-02-25 NOTE — Assessment & Plan Note (Signed)
On thyroid replacement.  Follow tsh.  

## 2015-02-25 NOTE — Assessment & Plan Note (Signed)
Blood pressure under good control.  Continue same medication regimen.  Follow pressures.  Follow metabolic panel.   

## 2015-02-26 ENCOUNTER — Other Ambulatory Visit: Payer: Self-pay | Admitting: Internal Medicine

## 2015-03-06 ENCOUNTER — Other Ambulatory Visit (INDEPENDENT_AMBULATORY_CARE_PROVIDER_SITE_OTHER): Payer: BLUE CROSS/BLUE SHIELD

## 2015-03-06 ENCOUNTER — Telehealth: Payer: Self-pay | Admitting: Internal Medicine

## 2015-03-06 DIAGNOSIS — E7800 Pure hypercholesterolemia, unspecified: Secondary | ICD-10-CM | POA: Diagnosis not present

## 2015-03-06 DIAGNOSIS — D510 Vitamin B12 deficiency anemia due to intrinsic factor deficiency: Secondary | ICD-10-CM | POA: Diagnosis not present

## 2015-03-06 DIAGNOSIS — I10 Essential (primary) hypertension: Secondary | ICD-10-CM

## 2015-03-06 DIAGNOSIS — M810 Age-related osteoporosis without current pathological fracture: Secondary | ICD-10-CM

## 2015-03-06 DIAGNOSIS — E039 Hypothyroidism, unspecified: Secondary | ICD-10-CM

## 2015-03-06 LAB — BASIC METABOLIC PANEL
BUN: 14 mg/dL (ref 6–23)
CALCIUM: 9.5 mg/dL (ref 8.4–10.5)
CO2: 26 meq/L (ref 19–32)
CREATININE: 0.84 mg/dL (ref 0.40–1.20)
Chloride: 104 mEq/L (ref 96–112)
GFR: 72.62 mL/min (ref >60.00)
GLUCOSE: 92 mg/dL (ref 70–99)
Potassium: 4.9 mEq/L (ref 3.5–5.1)
Sodium: 138 mEq/L (ref 135–145)

## 2015-03-06 LAB — TSH: TSH: 3.63 u[IU]/mL (ref 0.35–4.50)

## 2015-03-06 LAB — CBC WITH DIFFERENTIAL/PLATELET
BASOS ABS: 0 10*3/uL (ref 0.0–0.1)
BASOS PCT: 0.4 % (ref 0.0–3.0)
EOS PCT: 1 % (ref 0.0–5.0)
Eosinophils Absolute: 0.1 10*3/uL (ref 0.0–0.7)
HCT: 39.1 % (ref 36.0–46.0)
Hemoglobin: 13.2 g/dL (ref 12.0–15.0)
LYMPHS ABS: 1.1 10*3/uL (ref 0.7–4.0)
Lymphocytes Relative: 22.5 % (ref 12.0–46.0)
MCHC: 33.8 g/dL (ref 30.0–36.0)
MCV: 90.8 fl (ref 78.0–100.0)
MONOS PCT: 8.2 % (ref 3.0–12.0)
Monocytes Absolute: 0.4 10*3/uL (ref 0.1–1.0)
NEUTROS ABS: 3.4 10*3/uL (ref 1.4–7.7)
NEUTROS PCT: 67.9 % (ref 43.0–77.0)
PLATELETS: 286 10*3/uL (ref 150.0–400.0)
RBC: 4.31 Mil/uL (ref 3.87–5.11)
RDW: 13.1 % (ref 11.5–15.5)
WBC: 5 10*3/uL (ref 4.0–10.5)

## 2015-03-06 LAB — HEPATIC FUNCTION PANEL
ALK PHOS: 41 U/L (ref 39–117)
ALT: 13 U/L (ref 0–35)
AST: 15 U/L (ref 0–37)
Albumin: 4.4 g/dL (ref 3.5–5.2)
BILIRUBIN DIRECT: 0.1 mg/dL (ref 0.0–0.3)
BILIRUBIN TOTAL: 0.7 mg/dL (ref 0.2–1.2)
Total Protein: 7.2 g/dL (ref 6.0–8.3)

## 2015-03-06 LAB — LIPID PANEL
Cholesterol: 180 mg/dL (ref 0–200)
HDL: 74.3 mg/dL (ref >39.00)
LDL Cholesterol: 95 mg/dL (ref 0–99)
NONHDL: 105.66
Total CHOL/HDL Ratio: 2
Triglycerides: 55 mg/dL (ref 0.0–149.0)
VLDL: 11 mg/dL (ref 0.0–40.0)

## 2015-03-06 LAB — VITAMIN D 25 HYDROXY (VIT D DEFICIENCY, FRACTURES): VITD: 35.39 ng/mL (ref 30.00–100.00)

## 2015-03-06 NOTE — Telephone Encounter (Signed)
Pt came in stating she needs a doctors note for Dec 23rd. Pt states that she has spoken to Dr. Lorin PicketScott about this. Please advise pt.

## 2015-03-06 NOTE — Telephone Encounter (Signed)
Pt left note for Dr. Lorin PicketScott about Dec. 23rd. Place note in Dr. Marina GoodellScotts box. Please adv pt.

## 2015-03-07 ENCOUNTER — Encounter: Payer: Self-pay | Admitting: *Deleted

## 2015-03-31 ENCOUNTER — Other Ambulatory Visit: Payer: Self-pay | Admitting: Internal Medicine

## 2015-04-05 ENCOUNTER — Ambulatory Visit: Payer: BLUE CROSS/BLUE SHIELD | Admitting: Internal Medicine

## 2015-05-01 ENCOUNTER — Ambulatory Visit: Payer: BLUE CROSS/BLUE SHIELD | Admitting: Internal Medicine

## 2015-07-19 LAB — HM MAMMOGRAPHY

## 2015-07-24 ENCOUNTER — Encounter: Payer: Self-pay | Admitting: Internal Medicine

## 2015-07-25 ENCOUNTER — Other Ambulatory Visit: Payer: Self-pay | Admitting: Internal Medicine

## 2015-07-26 NOTE — Telephone Encounter (Signed)
Rx refill sent to pharmacy. 

## 2015-08-01 ENCOUNTER — Other Ambulatory Visit: Payer: Self-pay | Admitting: Internal Medicine

## 2015-08-02 ENCOUNTER — Encounter: Payer: Self-pay | Admitting: Internal Medicine

## 2015-08-02 ENCOUNTER — Ambulatory Visit (INDEPENDENT_AMBULATORY_CARE_PROVIDER_SITE_OTHER): Payer: BLUE CROSS/BLUE SHIELD | Admitting: Internal Medicine

## 2015-08-02 ENCOUNTER — Other Ambulatory Visit (HOSPITAL_COMMUNITY)
Admission: RE | Admit: 2015-08-02 | Discharge: 2015-08-02 | Disposition: A | Discharge status: Home / Self Care | Payer: BLUE CROSS/BLUE SHIELD | Source: Ambulatory Visit | Attending: Internal Medicine | Admitting: Internal Medicine

## 2015-08-02 VITALS — BP 122/80 | HR 59 | Temp 97.9°F | Resp 18 | Ht 64.5 in | Wt 143.5 lb

## 2015-08-02 DIAGNOSIS — I10 Essential (primary) hypertension: Secondary | ICD-10-CM

## 2015-08-02 DIAGNOSIS — E7800 Pure hypercholesterolemia, unspecified: Secondary | ICD-10-CM | POA: Diagnosis not present

## 2015-08-02 DIAGNOSIS — Z124 Encounter for screening for malignant neoplasm of cervix: Secondary | ICD-10-CM | POA: Diagnosis not present

## 2015-08-02 DIAGNOSIS — Z0000 Encounter for general adult medical examination without abnormal findings: Secondary | ICD-10-CM | POA: Diagnosis not present

## 2015-08-02 DIAGNOSIS — Z01419 Encounter for gynecological examination (general) (routine) without abnormal findings: Secondary | ICD-10-CM | POA: Diagnosis present

## 2015-08-02 DIAGNOSIS — E039 Hypothyroidism, unspecified: Secondary | ICD-10-CM | POA: Diagnosis not present

## 2015-08-02 DIAGNOSIS — Z1151 Encounter for screening for human papillomavirus (HPV): Secondary | ICD-10-CM | POA: Diagnosis present

## 2015-08-02 DIAGNOSIS — M810 Age-related osteoporosis without current pathological fracture: Secondary | ICD-10-CM

## 2015-08-02 DIAGNOSIS — Z86010 Personal history of colonic polyps: Secondary | ICD-10-CM

## 2015-08-02 DIAGNOSIS — F439 Reaction to severe stress, unspecified: Secondary | ICD-10-CM

## 2015-08-02 DIAGNOSIS — Z658 Other specified problems related to psychosocial circumstances: Secondary | ICD-10-CM

## 2015-08-02 LAB — BASIC METABOLIC PANEL
BUN: 13 mg/dL (ref 6–23)
CHLORIDE: 103 meq/L (ref 96–112)
CO2: 28 mEq/L (ref 19–32)
Calcium: 9.7 mg/dL (ref 8.4–10.5)
Creatinine, Ser: 0.86 mg/dL (ref 0.40–1.20)
GFR: 70.58 mL/min (ref >60.00)
Glucose, Bld: 86 mg/dL (ref 70–99)
POTASSIUM: 4.3 meq/L (ref 3.5–5.1)
SODIUM: 135 meq/L (ref 135–145)

## 2015-08-02 LAB — HEPATIC FUNCTION PANEL
ALBUMIN: 4.4 g/dL (ref 3.5–5.2)
ALK PHOS: 39 U/L (ref 39–117)
ALT: 13 U/L (ref 0–35)
AST: 16 U/L (ref 0–37)
Bilirubin, Direct: 0.1 mg/dL (ref 0.0–0.3)
TOTAL PROTEIN: 7.3 g/dL (ref 6.0–8.3)
Total Bilirubin: 0.6 mg/dL (ref 0.2–1.2)

## 2015-08-02 LAB — LIPID PANEL
Cholesterol: 180 mg/dL (ref 0–200)
HDL: 70.6 mg/dL (ref >39.00)
LDL Cholesterol: 95 mg/dL (ref 0–99)
NONHDL: 109.16
Total CHOL/HDL Ratio: 3
Triglycerides: 71 mg/dL (ref 0.0–149.0)
VLDL: 14.2 mg/dL (ref 0.0–40.0)

## 2015-08-02 LAB — TSH: TSH: 1.52 u[IU]/mL (ref 0.35–4.50)

## 2015-08-02 MED ORDER — CYANOCOBALAMIN 1000 MCG/ML IJ SOLN: INTRAMUSCULAR | Status: DC

## 2015-08-02 NOTE — Progress Notes (Signed)
Patient ID: Wendy Nichols, female   DOB: [birth year], 65 y.o.   MRN: [id number]   Subjective:    Patient ID: Wendy Nichols, female    DOB: [birth year], 65 y.o.   MRN: [id number]  HPI  Patient here for her physical exam.  Her husband recently passed away.  See last note.  She has good support and feels she is doing better.  Does not feel needs any further intervention.  Tries to stay active.  No cardiac symptoms with increased activity or exertion.  No sob.  No acid reflux.  No abdominal pain or cramping.  Bowels stable.  Discussed the need for colonoscopy.     Past Medical History  Diagnosis Date  . Hypertension   . Hypothyroidism   . Pernicious anemia   . Hypercholesterolemia   . GERD (gastroesophageal reflux disease)   . Osteoporosis   . History of colon polyps    Past Surgical History  Procedure Laterality Date  . Abdominal hysterectomy  1997    abnormal uterine bleeding.   . Breast biopsy      biopsies in both breasts  . Dilation and curettage of uterus  1979   Family History  Problem Relation Age of Onset  . Breast cancer Mother   . Kidney disease Mother   . Hypertension Mother   . Heart disease Mother   . Heart disease Father     myocardial infarction  . Breast cancer Maternal Aunt   . Breast cancer Maternal Grandmother   . Heart disease Brother     myocardial infarction  . Brain cancer Brother    Social History   Social History  . Marital Status: Married    Spouse Name: N/A  . Number of Children: 3  . Years of Education: N/A   Occupational History  .     Social History Main Topics  . Smoking status: Never Smoker   . Smokeless tobacco: Never Used  . Alcohol Use: 0.0 oz/week    0 Standard drinks or equivalent per week     Comment: occasional wine  . Drug Use: No  . Sexual Activity: Not Asked   Other Topics Concern  . None   Social History Narrative    Outpatient Encounter Prescriptions as of 08/02/2015  Medication Sig  . aspirin 81 MG tablet  Take 81 mg by mouth every other day.  . cyanocobalamin (,VITAMIN B-12,) 1000 MCG/ML injection INJECT 1 ML (1,000 MCG TOTAL) INTO THE MUSCLE EVERY 30 (THIRTY) DAYS.  Marland Kitchen levothyroxine (SYNTHROID) 100 MCG tablet TAKE 1 TABLET BY MOUTH DAILY.  . metoprolol tartrate (LOPRESSOR) 25 MG tablet TAKE 1/2 TABLET BY MOUTH DAILY.  . simvastatin (ZOCOR) 10 MG tablet TAKE 1 TABLET (10 MG TOTAL) BY MOUTH EVERY EVENING.  . SYRINGE-NEEDLE, DISP, 3 ML 25G X 1" 3 ML MISC Dose and route does not apply.  To be used to inject 1065mcg/mL Cyanocobalamin IM once every 30 days  . [DISCONTINUED] cyanocobalamin (,VITAMIN B-12,) 1000 MCG/ML injection INJECT 1 ML (1,000 MCG TOTAL) INTO THE MUSCLE EVERY 30 (THIRTY) DAYS.  . [DISCONTINUED] lisinopril (PRINIVIL,ZESTRIL) 10 MG tablet TAKE 1 TABLET (10 MG TOTAL) BY MOUTH DAILY.  Marland Kitchen ALPRAZolam (XANAX) 0.25 MG tablet Take 1 tablet (0.25 mg total) by mouth daily as needed for anxiety. (Patient not taking: Reported on 08/02/2015)  . traZODone (DESYREL) 50 MG tablet Take 0.5-1 tablets (25-50 mg total) by mouth at bedtime as needed for sleep. (Patient not taking: Reported on 08/02/2015)   No  facility-administered encounter medications on file as of 08/02/2015.    Review of Systems  Constitutional: Negative for appetite change and unexpected weight change.  HENT: Negative for congestion and sinus pressure.   Eyes: Negative for pain and visual disturbance.  Respiratory: Negative for cough, chest tightness and shortness of breath.   Cardiovascular: Negative for chest pain, palpitations and leg swelling.  Gastrointestinal: Negative for nausea, vomiting, abdominal pain and diarrhea.  Genitourinary: Negative for dysuria and difficulty urinating.  Musculoskeletal: Negative for back pain and joint swelling.  Skin: Negative for color change and rash.  Neurological: Negative for dizziness, light-headedness and headaches.  Hematological: Negative for adenopathy. Does not bruise/bleed easily.    Psychiatric/Behavioral: Negative for dysphoric mood and agitation.       Objective:     Blood pressure rechecked by me:  128/74  Physical Exam  Constitutional: She is oriented to person, place, and time. She appears well-developed and well-nourished. No distress.  HENT:  Nose: Nose normal.  Mouth/Throat: Oropharynx is clear and moist.  Eyes: Right eye exhibits no discharge. Left eye exhibits no discharge. No scleral icterus.  Neck: Neck supple. No thyromegaly present.  Cardiovascular: Normal rate and regular rhythm.   Pulmonary/Chest: Breath sounds normal. No accessory muscle usage. No tachypnea. No respiratory distress. She has no decreased breath sounds. She has no wheezes. She has no rhonchi. Right breast exhibits no inverted nipple, no mass, no nipple discharge and no tenderness (no axillary adenopathy). Left breast exhibits no inverted nipple, no mass, no nipple discharge and no tenderness (no axilarry adenopathy).  Abdominal: Soft. Bowel sounds are normal. There is no tenderness.  Genitourinary:  Normal external genitalia.  Vaginal vault without lesions.  S/p hysterectomy.   Pap smear of the vaginal cuff performed.  Could not appreciate any adnexal masses or tenderness.    Musculoskeletal: She exhibits no edema or tenderness.  Lymphadenopathy:    She has no cervical adenopathy.  Neurological: She is alert and oriented to person, place, and time.  Skin: Skin is warm. No rash noted. No erythema.  Psychiatric: She has a normal mood and affect. Her behavior is normal.    BP 122/80 mmHg  Pulse 59  Temp(Src) 97.9 F (36.6 C) (Oral)  Resp 18  Ht 5' 4.5" (1.638 m)  Wt 143 lb 8 oz (65.091 kg)  BMI 24.26 kg/m2  SpO2 98%  LMP 03/25/1996 Wt Readings from Last 3 Encounters:  08/02/15 143 lb 8 oz (65.091 kg)  02/20/15 144 lb (65.318 kg)  08/01/14 145 lb 8 oz (65.998 kg)     Lab Results  Component Value Date   WBC 5.0 03/06/2015   HGB 13.2 03/06/2015   HCT 39.1 03/06/2015    PLT 286.0 03/06/2015   GLUCOSE 86 08/02/2015   CHOL 180 08/02/2015   TRIG 71.0 08/02/2015   HDL 70.60 08/02/2015   LDLCALC 95 08/02/2015   ALT 13 08/02/2015   AST 16 08/02/2015   NA 135 08/02/2015   K 4.3 08/02/2015   CL 103 08/02/2015   CREATININE 0.86 08/02/2015   BUN 13 08/02/2015   CO2 28 08/02/2015   TSH 1.52 08/02/2015       Assessment & Plan:   Problem List Items Addressed This Visit    Health care maintenance    Physical today 08/02/15.  S/p hysterectomy.  Mammogram 07/19/15 - ok.  Recommended f/u mammogram in one year.  Colonoscopy 12/2009.  Refer to GI for f/u colonoscopy.  PAP 08/02/15.  History of colonic polyps    Colonoscopy 12/2009 with 4 one mm polyps in the ascending, descending and sigmoid colon.  Recommended f/u colonoscopy in five years.  Schedule for f/u colonoscopy.        Relevant Orders   Ambulatory referral to Gastroenterology   Hypercholesterolemia   Relevant Orders   Hepatic function panel (Completed)   Lipid panel (Completed)   Hypertension    Blood pressure under good control.  Continue same medication regimen.  Follow pressures.  Follow metabolic panel.        Relevant Orders   Basic metabolic panel (Completed)   Hypothyroidism    On thyroid replacement.  Follow tsh.       Relevant Orders   TSH (Completed)   Osteoporosis    Continue calcium, vitamin D and weight bearing exercise.  Discussed need for bone density.  Will notify me when agreeable.        Stress    Increased stress as outlined.  Feels she is dealing with her husband's death relatively well.  Has good support.  Follow.         Other Visit Diagnoses    Pap smear for cervical cancer screening    -  Primary    Relevant Orders    Cytology - PAP (Completed)        Dale [place], MD

## 2015-08-02 NOTE — Progress Notes (Signed)
Pre-visit discussion using our clinic review tool. No additional management support is needed unless otherwise documented below in the visit note.  

## 2015-08-03 ENCOUNTER — Encounter: Payer: Self-pay | Admitting: *Deleted

## 2015-08-03 LAB — CYTOLOGY - PAP

## 2015-08-06 ENCOUNTER — Encounter: Payer: Self-pay | Admitting: *Deleted

## 2015-08-06 ENCOUNTER — Encounter: Payer: Self-pay | Admitting: Internal Medicine

## 2015-08-06 NOTE — Assessment & Plan Note (Signed)
Blood pressure under good control.  Continue same medication regimen.  Follow pressures.  Follow metabolic panel.   

## 2015-08-06 NOTE — Assessment & Plan Note (Signed)
Colonoscopy 12/2009 with 4 one mm polyps in the ascending, descending and sigmoid colon.  Recommended f/u colonoscopy in five years.  Schedule for f/u colonoscopy.

## 2015-08-06 NOTE — Assessment & Plan Note (Signed)
On thyroid replacement.  Follow tsh.  

## 2015-08-06 NOTE — Assessment & Plan Note (Signed)
Physical today 08/02/15.  S/p hysterectomy.  Mammogram 07/19/15 - ok.  Recommended f/u mammogram in one year.  Colonoscopy 12/2009.  Refer to GI for f/u colonoscopy.  PAP 08/02/15.

## 2015-08-06 NOTE — Assessment & Plan Note (Signed)
Continue calcium, vitamin D and weight bearing exercise.  Discussed need for bone density.  Will notify me when agreeable.

## 2015-08-06 NOTE — Assessment & Plan Note (Signed)
Increased stress as outlined.  Feels she is dealing with her husband's death relatively well.  Has good support.  Follow.

## 2015-08-27 ENCOUNTER — Other Ambulatory Visit: Payer: Self-pay | Admitting: Internal Medicine

## 2016-02-01 ENCOUNTER — Ambulatory Visit (INDEPENDENT_AMBULATORY_CARE_PROVIDER_SITE_OTHER): Payer: BLUE CROSS/BLUE SHIELD | Admitting: Internal Medicine

## 2016-02-01 ENCOUNTER — Encounter: Payer: Self-pay | Admitting: Internal Medicine

## 2016-02-01 DIAGNOSIS — E039 Hypothyroidism, unspecified: Secondary | ICD-10-CM | POA: Diagnosis not present

## 2016-02-01 DIAGNOSIS — E7800 Pure hypercholesterolemia, unspecified: Secondary | ICD-10-CM | POA: Diagnosis not present

## 2016-02-01 DIAGNOSIS — F439 Reaction to severe stress, unspecified: Secondary | ICD-10-CM | POA: Diagnosis not present

## 2016-02-01 DIAGNOSIS — Z9109 Other allergy status, other than to drugs and biological substances: Secondary | ICD-10-CM

## 2016-02-01 DIAGNOSIS — D510 Vitamin B12 deficiency anemia due to intrinsic factor deficiency: Secondary | ICD-10-CM | POA: Diagnosis not present

## 2016-02-01 DIAGNOSIS — I10 Essential (primary) hypertension: Secondary | ICD-10-CM | POA: Diagnosis not present

## 2016-02-01 DIAGNOSIS — Z86010 Personal history of colonic polyps: Secondary | ICD-10-CM

## 2016-02-01 LAB — CBC WITH DIFFERENTIAL/PLATELET
BASOS ABS: 0 10*3/uL (ref 0.0–0.1)
Basophils Relative: 0.7 % (ref 0.0–3.0)
EOS ABS: 0.1 10*3/uL (ref 0.0–0.7)
Eosinophils Relative: 2.1 % (ref 0.0–5.0)
HEMATOCRIT: 37.2 % (ref 36.0–46.0)
Hemoglobin: 12.8 g/dL (ref 12.0–15.0)
LYMPHS ABS: 1.2 10*3/uL (ref 0.7–4.0)
LYMPHS PCT: 26.2 % (ref 12.0–46.0)
MCHC: 34.3 g/dL (ref 30.0–36.0)
MCV: 89.8 fl (ref 78.0–100.0)
MONO ABS: 0.4 10*3/uL (ref 0.1–1.0)
Monocytes Relative: 8.8 % (ref 3.0–12.0)
NEUTROS ABS: 2.8 10*3/uL (ref 1.4–7.7)
NEUTROS PCT: 62.2 % (ref 43.0–77.0)
PLATELETS: 253 10*3/uL (ref 150.0–400.0)
RBC: 4.14 Mil/uL (ref 3.87–5.11)
RDW: 12.7 % (ref 11.5–15.5)
WBC: 4.6 10*3/uL (ref 4.0–10.5)

## 2016-02-01 LAB — LIPID PANEL
CHOL/HDL RATIO: 3
Cholesterol: 169 mg/dL (ref 0–200)
HDL: 65.9 mg/dL (ref >39.00)
LDL CALC: 88 mg/dL (ref 0–99)
NonHDL: 102.9
TRIGLYCERIDES: 77 mg/dL (ref 0.0–149.0)
VLDL: 15.4 mg/dL (ref 0.0–40.0)

## 2016-02-01 LAB — BASIC METABOLIC PANEL
BUN: 18 mg/dL (ref 6–23)
CHLORIDE: 104 meq/L (ref 96–112)
CO2: 28 mEq/L (ref 19–32)
CREATININE: 0.94 mg/dL (ref 0.40–1.20)
Calcium: 9.6 mg/dL (ref 8.4–10.5)
GFR: 63.59 mL/min (ref >60.00)
Glucose, Bld: 96 mg/dL (ref 70–99)
POTASSIUM: 4.2 meq/L (ref 3.5–5.1)
Sodium: 137 mEq/L (ref 135–145)

## 2016-02-01 LAB — HEPATIC FUNCTION PANEL
ALT: 14 U/L (ref 0–35)
AST: 17 U/L (ref 0–37)
Albumin: 4.5 g/dL (ref 3.5–5.2)
Alkaline Phosphatase: 36 U/L — ABNORMAL LOW (ref 39–117)
BILIRUBIN DIRECT: 0.1 mg/dL (ref 0.0–0.3)
BILIRUBIN TOTAL: 0.6 mg/dL (ref 0.2–1.2)
Total Protein: 7.3 g/dL (ref 6.0–8.3)

## 2016-02-01 MED ORDER — FLUTICASONE PROPIONATE 50 MCG/ACT NA SUSP: 2.0000 | Freq: Every day | NASAL | 3 refills | Status: DC

## 2016-02-01 NOTE — Assessment & Plan Note (Signed)
Discussed with her today the need for colonoscopy.  Tried to arrange previously.  Did not need referral.  They just need her to call office.  She will call.

## 2016-02-01 NOTE — Assessment & Plan Note (Signed)
Some allergy symptoms.  Treat with flonase and saline nasal spray as directed.  Follow.

## 2016-02-01 NOTE — Assessment & Plan Note (Signed)
On thyroid replacement.  Follow tsh.  Last check wnl.   

## 2016-02-01 NOTE — Patient Instructions (Signed)
flonase nasal spray - 2 sprays each nostril one time per day.  Do this in the evening.    Saline nasal spray - flush nose at least 2-3x/day.

## 2016-02-01 NOTE — Assessment & Plan Note (Signed)
Handling things well.  Feels she is doing well.  Follow.

## 2016-02-01 NOTE — Assessment & Plan Note (Signed)
Continue B12 injections.  Check cbc today.

## 2016-02-01 NOTE — Assessment & Plan Note (Signed)
Low cholesterol diet and exercise.  Follow lipid panel and liver function tests.  On simvastatin.   

## 2016-02-01 NOTE — Progress Notes (Signed)
Pre visit review using our clinic review tool, if applicable. No additional management support is needed unless otherwise documented below in the visit note. 

## 2016-02-01 NOTE — Assessment & Plan Note (Signed)
Blood pressure under good control.  Continue same medication regimen.  Follow pressures.  Follow metabolic panel.   

## 2016-02-01 NOTE — Progress Notes (Signed)
Patient ID: Wendy Nichols, female   DOB: 05-02-1950, 65 y.o.   MRN: [id number]   Subjective:    Patient ID: Wendy Nichols, female    DOB: [birth year], 65 y.o.   MRN: [id number]  HPI  Patient here for a scheduled follow up.  States she feels she is doing well.  No chest pain.  No sob.  No acid reflux.  No abdominal pain or cramping.  Bowels stable.  Handling things well.  Some allergy symptoms.  Used afrin. Improved.  Discussed restarting flonase.  No chest congestion.     Past Medical History:  Diagnosis Date  . GERD (gastroesophageal reflux disease)   . History of colon polyps   . Hypercholesterolemia   . Hypertension   . Hypothyroidism   . Osteoporosis   . Pernicious anemia    Past Surgical History:  Procedure Laterality Date  . ABDOMINAL HYSTERECTOMY  1997   abnormal uterine bleeding.   Marland Kitchen BREAST BIOPSY     biopsies in both breasts  . DILATION AND CURETTAGE OF UTERUS  1979   Family History  Problem Relation Age of Onset  . Breast cancer Mother   . Kidney disease Mother   . Hypertension Mother   . Heart disease Mother   . Heart disease Father     myocardial infarction  . Breast cancer Maternal Aunt   . Breast cancer Maternal Grandmother   . Heart disease Brother     myocardial infarction  . Brain cancer Brother    Social History   Social History  . Marital status: Married    Spouse name: N/A  . Number of children: 3  . Years of education: N/A   Occupational History  .  [employer]   Social History Main Topics  . Smoking status: Never Smoker  . Smokeless tobacco: Never Used  . Alcohol use 0.0 oz/week     Comment: occasional wine  . Drug use: No  . Sexual activity: Not Asked   Other Topics Concern  . None   Social History Narrative  . None    Outpatient Encounter Prescriptions as of 02/01/2016  Medication Sig  . ALPRAZolam (XANAX) 0.25 MG tablet Take 1 tablet (0.25 mg total) by mouth daily as needed for anxiety.  Marland Kitchen aspirin 81 MG tablet Take 81 mg  by mouth every other day.  . cyanocobalamin (,VITAMIN B-12,) 1000 MCG/ML injection INJECT 1 ML (1,000 MCG TOTAL) INTO THE MUSCLE EVERY 30 (THIRTY) DAYS.  Marland Kitchen levothyroxine (SYNTHROID, LEVOTHROID) 100 MCG tablet TAKE 1 TABLET BY MOUTH DAILY.  Marland Kitchen lisinopril (PRINIVIL,ZESTRIL) 10 MG tablet TAKE 1 TABLET (10 MG TOTAL) BY MOUTH DAILY.  . metoprolol tartrate (LOPRESSOR) 25 MG tablet TAKE 1/2 TABLET BY MOUTH DAILY.  . simvastatin (ZOCOR) 10 MG tablet TAKE 1 TABLET (10 MG TOTAL) BY MOUTH EVERY EVENING.  . SYRINGE-NEEDLE, DISP, 3 ML 25G X 1" 3 ML MISC Dose and route does not apply.  To be used to inject 1057mcg/mL Cyanocobalamin IM once every 30 days  . [DISCONTINUED] traZODone (DESYREL) 50 MG tablet Take 0.5-1 tablets (25-50 mg total) by mouth at bedtime as needed for sleep.  . fluticasone (FLONASE) 50 MCG/ACT nasal spray Place 2 sprays into both nostrils daily.   No facility-administered encounter medications on file as of 02/01/2016.     Review of Systems  Constitutional: Negative for appetite change and unexpected weight change.  HENT: Positive for congestion. Negative for sinus pressure.        Previous ear fullness.  Respiratory: Negative for cough, chest tightness and shortness of breath.   Cardiovascular: Negative for chest pain, palpitations and leg swelling.  Gastrointestinal: Negative for abdominal pain, diarrhea, nausea and vomiting.  Skin: Negative for color change and rash.  Neurological: Negative for headaches.       No dizziness now.    Psychiatric/Behavioral: Negative for agitation and dysphoric mood.       Sleeping better.        Objective:     Blood pressure rechecked by me:  118/72  Physical Exam  Constitutional: She appears well-developed and well-nourished. No distress.  HENT:  Nose: Nose normal.  Mouth/Throat: Oropharynx is clear and moist.  Neck: Neck supple. No thyromegaly present.  Cardiovascular: Normal rate and regular rhythm.   Pulmonary/Chest: Breath sounds  normal. No respiratory distress. She has no wheezes.  Abdominal: Soft. Bowel sounds are normal. There is no tenderness.  Musculoskeletal: She exhibits no edema or tenderness.  Lymphadenopathy:    She has no cervical adenopathy.  Skin: No rash noted. No erythema.  Psychiatric: She has a normal mood and affect. Her behavior is normal.    BP 118/80   Pulse 60   Temp 98.3 F (36.8 C) (Oral)   Ht 5\' 5"  (1.651 m)   Wt 142 lb 9.6 oz (64.7 kg)   LMP 03/25/1996   SpO2 97%   BMI 23.73 kg/m  Wt Readings from Last 3 Encounters:  02/01/16 142 lb 9.6 oz (64.7 kg)  08/02/15 143 lb 8 oz (65.1 kg)  02/20/15 144 lb (65.3 kg)     Lab Results  Component Value Date   WBC 5.0 03/06/2015   HGB 13.2 03/06/2015   HCT 39.1 03/06/2015   PLT 286.0 03/06/2015   GLUCOSE 86 08/02/2015   CHOL 180 08/02/2015   TRIG 71.0 08/02/2015   HDL 70.60 08/02/2015   LDLCALC 95 08/02/2015   ALT 13 08/02/2015   AST 16 08/02/2015   NA 135 08/02/2015   K 4.3 08/02/2015   CL 103 08/02/2015   CREATININE 0.86 08/02/2015   BUN 13 08/02/2015   CO2 28 08/02/2015   TSH 1.52 08/02/2015       Assessment & Plan:   Problem List Items Addressed This Visit    Environmental allergies    Some allergy symptoms.  Treat with flonase and saline nasal spray as directed.  Follow.       History of colonic polyps    Discussed with her today the need for colonoscopy.  Tried to arrange previously.  Did not need referral.  They just need her to call office.  She will call.        Hypercholesterolemia    Low cholesterol diet and exercise.  Follow lipid panel and liver function tests.  On simvastatin.        Relevant Orders   Hepatic function panel   Lipid panel   Hypertension    Blood pressure under good control.  Continue same medication regimen.  Follow pressures.  Follow metabolic panel.        Relevant Orders   Basic metabolic panel   CBC with Differential/Platelet   Hypothyroidism    On thyroid replacement.   Follow tsh.  Last check wnl.       Pernicious anemia    Continue B12 injections.  Check cbc today.       Stress    Handling things well.  Feels she is doing well.  Follow.  Other Visit Diagnoses   None.      Dale [place], MD

## 2016-02-04 ENCOUNTER — Telehealth: Payer: Self-pay | Admitting: Internal Medicine

## 2016-02-04 NOTE — Telephone Encounter (Signed)
Pt called returning your call. Thank you! °

## 2016-02-04 NOTE — Telephone Encounter (Signed)
Patient notified

## 2016-02-06 ENCOUNTER — Ambulatory Visit: Payer: BLUE CROSS/BLUE SHIELD

## 2016-02-13 ENCOUNTER — Ambulatory Visit: Payer: BLUE CROSS/BLUE SHIELD

## 2016-02-22 ENCOUNTER — Other Ambulatory Visit: Payer: Self-pay | Admitting: Internal Medicine

## 2016-02-25 ENCOUNTER — Ambulatory Visit: Payer: BLUE CROSS/BLUE SHIELD

## 2016-03-03 ENCOUNTER — Ambulatory Visit (INDEPENDENT_AMBULATORY_CARE_PROVIDER_SITE_OTHER): Payer: BLUE CROSS/BLUE SHIELD

## 2016-03-03 DIAGNOSIS — Z23 Encounter for immunization: Secondary | ICD-10-CM

## 2016-03-06 ENCOUNTER — Other Ambulatory Visit: Payer: Self-pay | Admitting: Internal Medicine

## 2016-05-10 ENCOUNTER — Other Ambulatory Visit: Payer: Self-pay | Admitting: Internal Medicine

## 2016-05-26 ENCOUNTER — Telehealth: Payer: Self-pay | Admitting: Internal Medicine

## 2016-05-26 ENCOUNTER — Encounter: Payer: Self-pay | Admitting: Family

## 2016-05-26 ENCOUNTER — Ambulatory Visit (INDEPENDENT_AMBULATORY_CARE_PROVIDER_SITE_OTHER): Payer: BLUE CROSS/BLUE SHIELD

## 2016-05-26 ENCOUNTER — Ambulatory Visit (INDEPENDENT_AMBULATORY_CARE_PROVIDER_SITE_OTHER): Payer: BLUE CROSS/BLUE SHIELD | Admitting: Family

## 2016-05-26 ENCOUNTER — Ambulatory Visit
Admission: RE | Admit: 2016-05-26 | Discharge: 2016-05-26 | Disposition: A | Discharge status: Home / Self Care | Payer: BLUE CROSS/BLUE SHIELD | Source: Ambulatory Visit | Attending: Family | Admitting: Family

## 2016-05-26 VITALS — BP 124/62 | HR 60 | Temp 97.9°F | Wt 145.4 lb

## 2016-05-26 DIAGNOSIS — M25552 Pain in left hip: Secondary | ICD-10-CM | POA: Diagnosis not present

## 2016-05-26 DIAGNOSIS — M545 Low back pain: Secondary | ICD-10-CM | POA: Diagnosis not present

## 2016-05-26 DIAGNOSIS — S0990XA Unspecified injury of head, initial encounter: Secondary | ICD-10-CM | POA: Diagnosis not present

## 2016-05-26 DIAGNOSIS — R51 Headache: Secondary | ICD-10-CM | POA: Diagnosis not present

## 2016-05-26 DIAGNOSIS — W11XXXA Fall on and from ladder, initial encounter: Secondary | ICD-10-CM | POA: Diagnosis not present

## 2016-05-26 DIAGNOSIS — H539 Unspecified visual disturbance: Secondary | ICD-10-CM | POA: Insufficient documentation

## 2016-05-26 DIAGNOSIS — M25531 Pain in right wrist: Secondary | ICD-10-CM

## 2016-05-26 DIAGNOSIS — M533 Sacrococcygeal disorders, not elsewhere classified: Secondary | ICD-10-CM | POA: Diagnosis not present

## 2016-05-26 IMAGING — DX DG WRIST COMPLETE 3+V*R*
4 series · 4 of 4 positions shown · non-contrast
Comparison: None.

CLINICAL DATA: Pt states she fell off 8 ft ladder and is having
lower back, left hip and tail bone and right wrist pain

EXAM:
RIGHT WRIST - COMPLETE 3+ VIEW

[wrist pa]
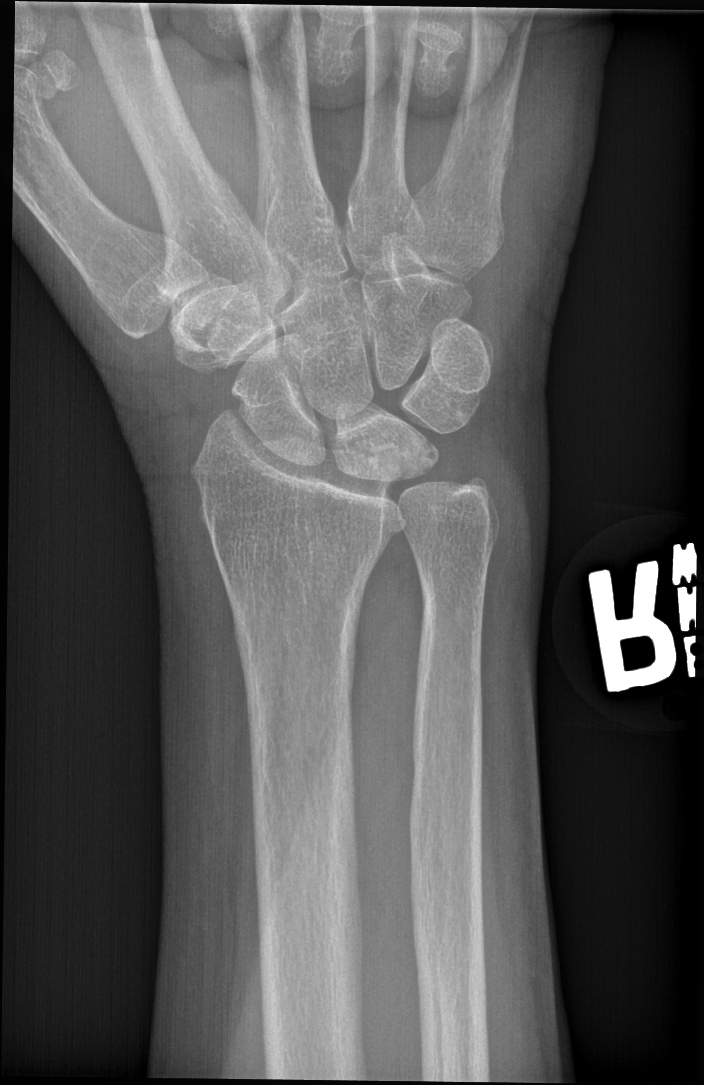

[wrist obl (oblique)]
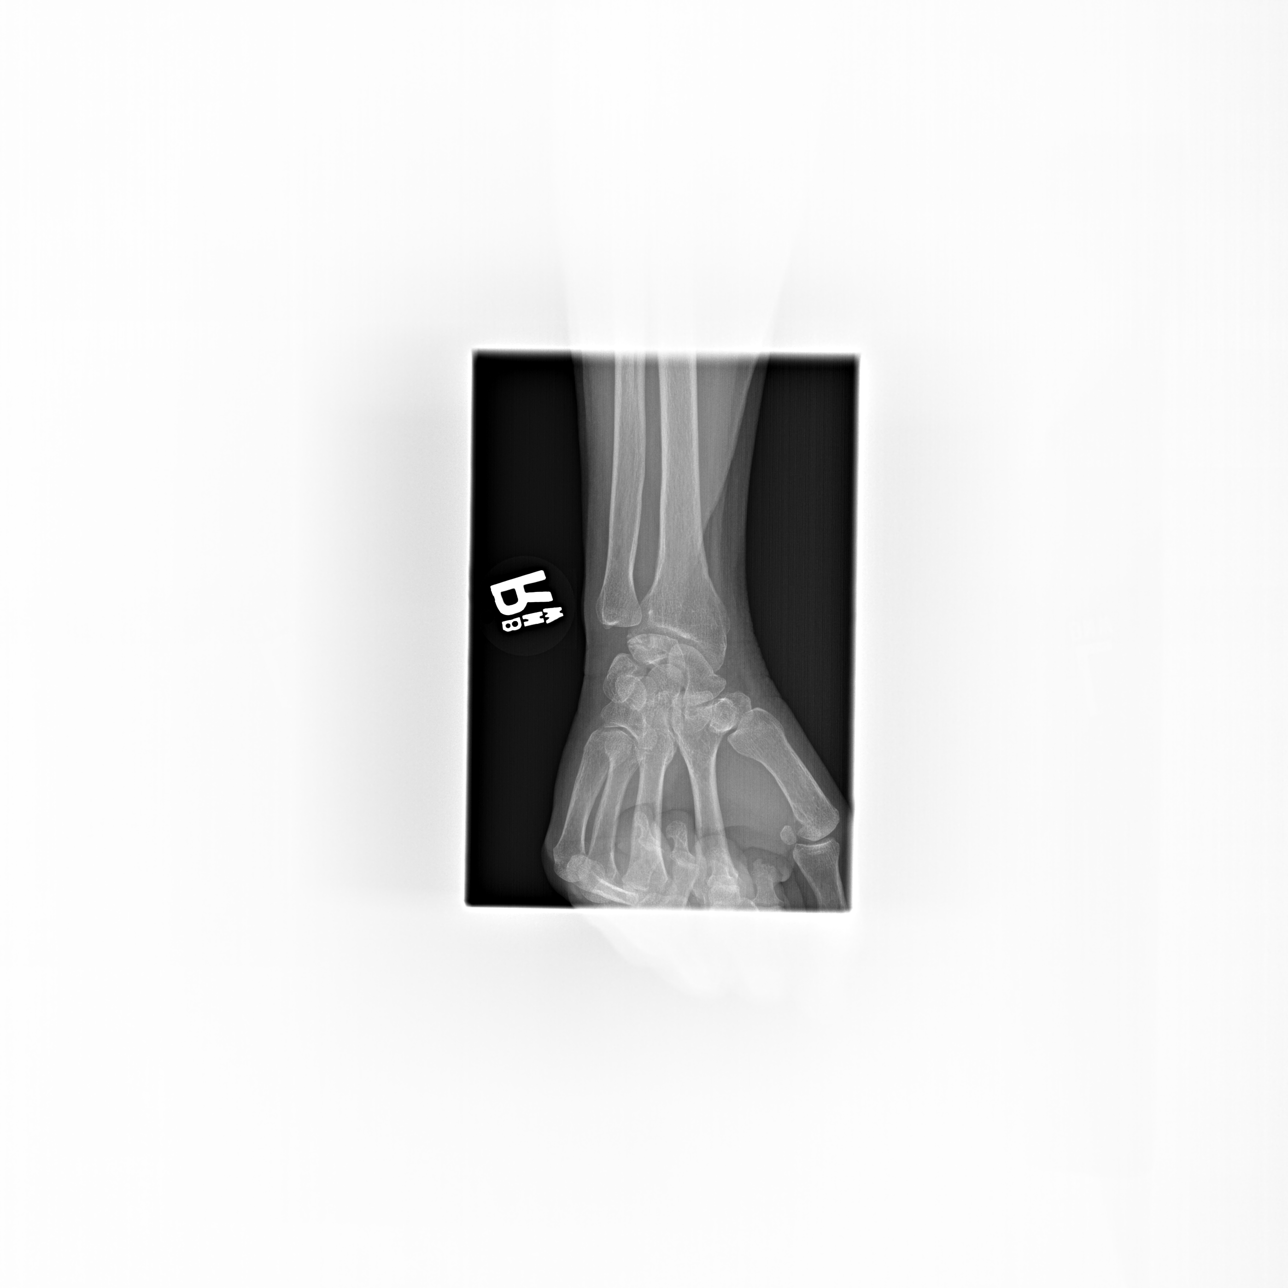

[wrist lat]
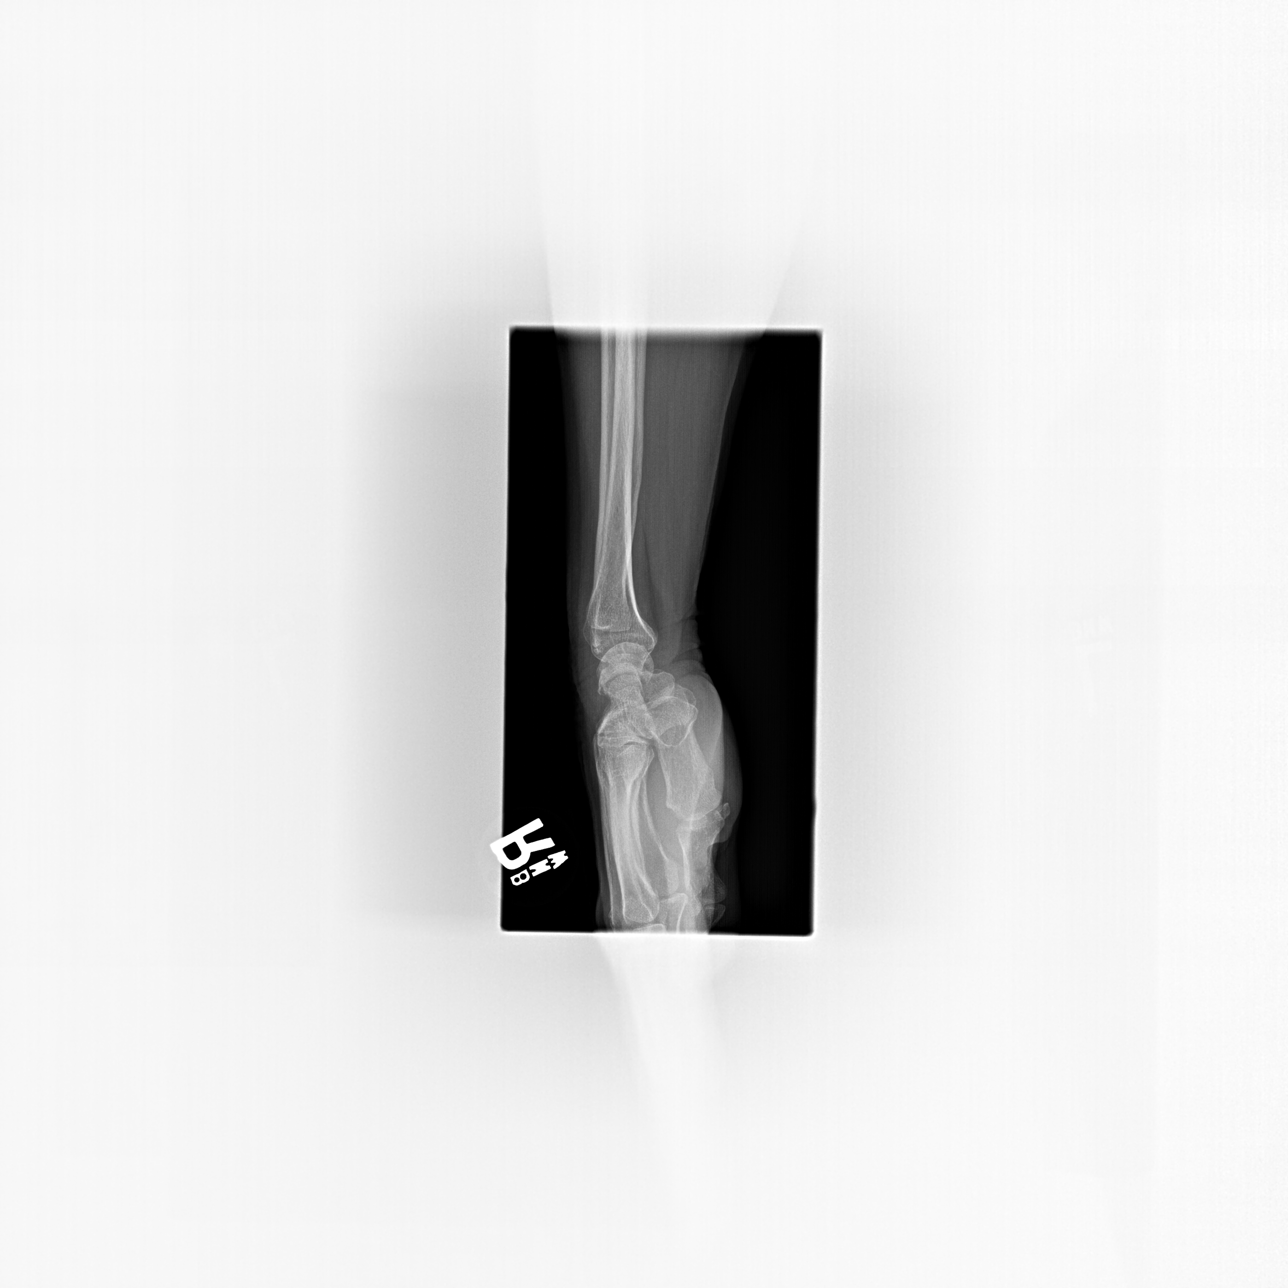

[scaphoid pa]
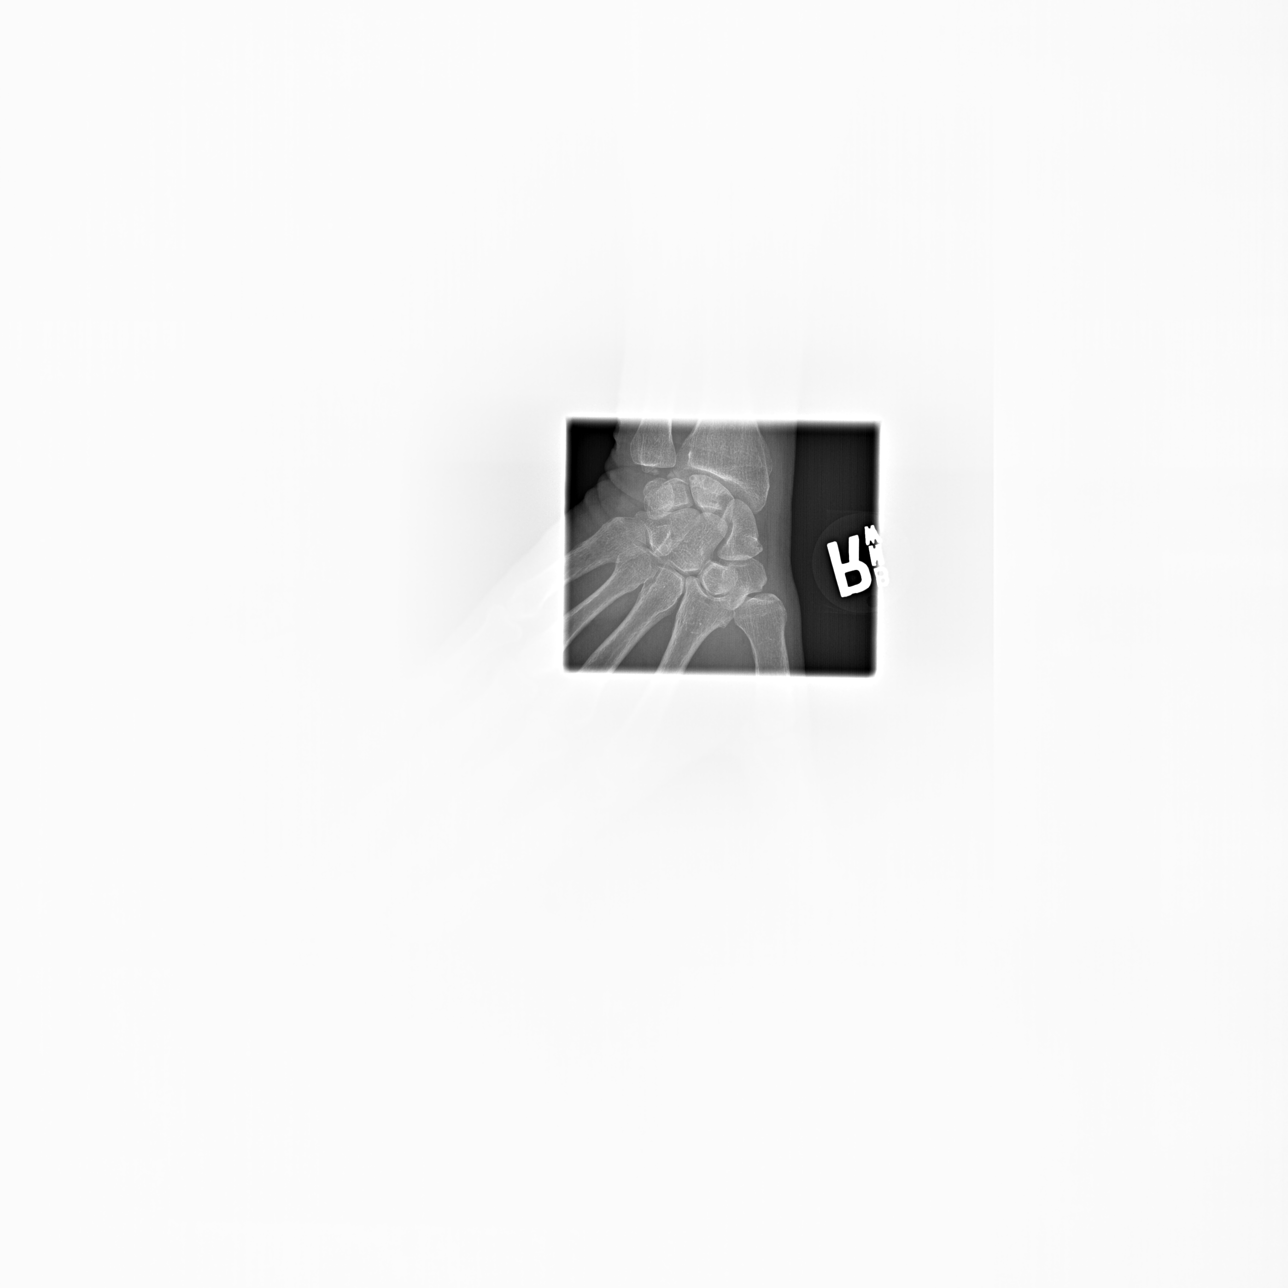

[4 of 4 positions shown; findings below may reference images not displayed]

FINDINGS: Cortical discontinuity in the lunate with adjacent sclerosis consist
with fracture, possibly subacute or chronic. No significant
displacement. Carpal rows are intact. Otherwise normal
mineralization and alignment. No other significant osseous
degenerative change.
IMPRESSION: 1. Lunate fracture, with sclerosis suggesting a nonacute component.

## 2016-05-26 NOTE — Patient Instructions (Signed)
CT head  Xrays  Will call with results  Continue ice, ibuprofen

## 2016-05-26 NOTE — Progress Notes (Signed)
Pre-visit discussion using our clinic review tool. No additional management support is needed unless otherwise documented below in the visit note.  

## 2016-05-26 NOTE — Addendum Note (Signed)
Addended by: Allegra GranaARNETT, Kendahl Bumgardner G on: 05/26/2016 03:43 PM   Modules accepted: Orders

## 2016-05-26 NOTE — Telephone Encounter (Signed)
Pt would also like her xray results.

## 2016-05-26 NOTE — Telephone Encounter (Signed)
Pt called and wanted to know if she should go to work tomorrow due to being sore. Please advise, thank you!  Call pt @ [phone]

## 2016-05-26 NOTE — Progress Notes (Signed)
Subjective:    Patient ID: Wendy Nichols, female    DOB: 1950/10/30, 66 y.o.   MRN: [id number]  CC: Wendy Nichols is a 66 y.o. female who presents today for an acute visit.    HPI: CC: low back pain s/p fall yesterday from a ladder, improved.  She c/o right arm and left hip pain. Tried to look at storage building and fell with ladder which landed on top of her. 'caught worst of it on my behind.' Notes soreness on lower abdomen from ladder falling on top of her, improved today. No abdominal pain currently. Hit the grass. Did hit her head but not most of impact. No LOC, HA, vision changes. Does not had a HA for 30 minutes after fall which resolved on its own in an hour. Took aleve and ice pack yesterday with some relief. Non today. Walking 'better today.'   Pain in right wrist worsened with lifting something heavy. Unsure if fell on extended wrist.   H/o osteopenia from dexa; no records of this.     HISTORY:  Past Medical History:  Diagnosis Date  . GERD (gastroesophageal reflux disease)   . History of colon polyps   . Hypercholesterolemia   . Hypertension   . Hypothyroidism   . Osteoporosis   . Pernicious anemia    Past Surgical History:  Procedure Laterality Date  . ABDOMINAL HYSTERECTOMY  1997   abnormal uterine bleeding.   Marland Kitchen BREAST BIOPSY     biopsies in both breasts  . DILATION AND CURETTAGE OF UTERUS  1979   Family History  Problem Relation Age of Onset  . Breast cancer Mother   . Kidney disease Mother   . Hypertension Mother   . Heart disease Mother   . Heart disease Father     myocardial infarction  . Breast cancer Maternal Aunt   . Breast cancer Maternal Grandmother   . Heart disease Brother     myocardial infarction  . Brain cancer Brother     Allergies: Codeine and Sulfa antibiotics Current Outpatient Prescriptions on File Prior to Visit  Medication Sig Dispense Refill  . ALPRAZolam (XANAX) 0.25 MG tablet Take 1 tablet (0.25 mg total) by mouth  daily as needed for anxiety. 20 tablet 0  . aspirin 81 MG tablet Take 81 mg by mouth every other day.    . cyanocobalamin (,VITAMIN B-12,) 1000 MCG/ML injection INJECT 1 ML (1,000 MCG TOTAL) INTO THE MUSCLE EVERY 30 (THIRTY) DAYS. 10 mL 1  . fluticasone (FLONASE) 50 MCG/ACT nasal spray Place 2 sprays into both nostrils daily. 16 g 3  . levothyroxine (SYNTHROID, LEVOTHROID) 100 MCG tablet TAKE 1 TABLET BY MOUTH DAILY. 90 tablet 2  . lisinopril (PRINIVIL,ZESTRIL) 10 MG tablet TAKE 1 TABLET (10 MG TOTAL) BY MOUTH DAILY. 90 tablet 1  . metoprolol tartrate (LOPRESSOR) 25 MG tablet TAKE 1/2 TABLET BY MOUTH DAILY. 45 tablet 1  . simvastatin (ZOCOR) 10 MG tablet TAKE 1 TABLET (10 MG TOTAL) BY MOUTH EVERY EVENING. 90 tablet 2  . SYRINGE-NEEDLE, DISP, 3 ML 25G X 1" 3 ML MISC Dose and route does not apply.  To be used to inject 1029mcg/mL Cyanocobalamin IM once every 30 days 50 each 0   No current facility-administered medications on file prior to visit.     Social History  Substance Use Topics  . Smoking status: Never Smoker  . Smokeless tobacco: Never Used  . Alcohol use 0.0 oz/week     Comment: occasional wine  Review of Systems  Constitutional: Negative for chills and fever.  Respiratory: Negative for cough.   Cardiovascular: Negative for chest pain and palpitations.  Gastrointestinal: Negative for abdominal distention, abdominal pain, nausea and vomiting.  Musculoskeletal: Positive for back pain.      Objective:    BP 124/62 (BP Location: Left Arm, Patient Position: Sitting, Cuff Size: Large)   Pulse 60   Temp 97.9 F (36.6 C) (Oral)   Wt 145 lb 6.4 oz (66 kg)   LMP 03/25/1996   BMI 24.20 kg/m    Physical Exam  Constitutional: She appears well-developed and well-nourished.  HENT:  Mouth/Throat: Uvula is midline, oropharynx is clear and moist and mucous membranes are normal.  Eyes: Conjunctivae and EOM are normal. Pupils are equal, round, and reactive to light.  Fundus normal  bilaterally.   Cardiovascular: Normal rate, regular rhythm, normal heart sounds and normal pulses.   Pulmonary/Chest: Effort normal and breath sounds normal. She has no wheezes. She has no rhonchi. She has no rales.  Abdominal: Soft. Normal appearance and bowel sounds are normal. She exhibits no distension, no fluid wave, no ascites and no mass. There is no tenderness. There is no rigidity, no rebound and no guarding.  No bruising  Musculoskeletal:       Right wrist: She exhibits tenderness and bony tenderness. She exhibits normal range of motion, no swelling, no effusion and no laceration.       Left hip: Normal. She exhibits normal range of motion, normal strength, no tenderness and no bony tenderness.       Lumbar back: She exhibits normal range of motion, no tenderness, no bony tenderness, no swelling, no edema, no pain and no spasm.       Arms: Full range of motion with flexion, tension, lateral side bends. No bony tenderness. No pain, numbness, tingling elicited with single leg raise bilaterally. Pain over left SI joint. No bruising.   Tenderness over right ulnar side as noted on diagram. Symmetric grip strength. Pain elicited with rotation, dorsiflexion. No focal snuffbox tenderness   Left Hip: No limp or waddling gait. Full ROM with flexion and hip rotation in flexion.  No pain of lateral hip with  (flexion-abduction-external rotation) test. No pain with deep palpation of greater trochanter.      Neurological: She is alert. She has normal strength. No cranial nerve deficit or sensory deficit. She displays a negative Romberg sign.  Reflex Scores:      Bicep reflexes are 2+ on the right side and 2+ on the left side.      Patellar reflexes are 2+ on the right side and 2+ on the left side. Sensation and strength intact bilateral lower extremities.  Skin: Skin is warm and dry.  Psychiatric: She has a normal mood and affect. Her speech is normal and behavior is normal. Thought content  normal.  Vitals reviewed.      Assessment & Plan:   1. Injury of head, initial encounter Pending CT. Reassured by normal neurologic exam, and absence of HA, vision changes. No anticoagulation. - CT Head Wo Contrast  2. Right wrist pain Low suspicion for scaphoid fracture however fall from 8'. Pending XRs.  - PR CLOSED RX NAVIC/LUNATE FX/DISLOC - DG Wrist Complete Right  3. Left hip pain Reassured by benign exam of hip. Patient able to weight bare. pending xrays - DG HIP UNILAT WITH PELVIS 2-3 VIEWS LEFT - DG Lumbar Spine 2-3 Views    I am having Ms. Ferd Glassing  maintain her aspirin, SYRINGE-NEEDLE (DISP) 3 ML, ALPRAZolam, cyanocobalamin, levothyroxine, fluticasone, lisinopril, metoprolol tartrate, and simvastatin.   No orders of the defined types were placed in this encounter.   Return precautions given.   Risks, benefits, and alternatives of the medications and treatment plan prescribed today were discussed, and patient expressed understanding.   Education regarding symptom management and diagnosis given to patient on AVS.  Continue to follow with Dale [place], MD for routine health maintenance.   Wendy Nichols and I agreed with plan.   Rennie Plowman, FNP

## 2016-05-27 ENCOUNTER — Telehealth: Payer: Self-pay | Admitting: Family

## 2016-05-27 NOTE — Telephone Encounter (Signed)
-----   Message from Wendy FerrariKristen C Bare, [place] sent at 05/26/2016  5:21 PM EST ----- Patient advised of below and verbalized understanding.   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   Should is questioning if she go back to work , she reports to work at 11:45am to 5:30pm .  I Patient states she was very uncomfortable laying down while getting CT of head down  , when tries to get up  and hits the end of spine feels pain , she states when she gets up on feet she is fine.    I told her I didn't think it would be a problem for you give her a work note.  Please advise.

## 2016-05-27 NOTE — Telephone Encounter (Signed)
[facility]

## 2016-05-27 NOTE — Telephone Encounter (Signed)
Patient was informed of results.  Patient understood and no questions, comments, or concerns at this time.  

## 2016-05-27 NOTE — Telephone Encounter (Signed)
Call pt-  When you call about results of xrays, please see how she is doing.   Work note provided.

## 2016-05-29 ENCOUNTER — Telehealth: Payer: Self-pay | Admitting: Family

## 2016-05-29 NOTE — Telephone Encounter (Signed)
Call pt  Just wanted to very sure she was better and doesn't need referral to orthopedic for what we think is old lunate fracture in wrist.

## 2016-05-30 NOTE — Telephone Encounter (Signed)
Left message for patient to return call back.  

## 2016-05-30 NOTE — Telephone Encounter (Signed)
Left detailed message for patient. Instructed patient to let us know if she feels better and return call if she wanted referral and also any questions.

## 2016-05-30 NOTE — Telephone Encounter (Signed)
Pt called back returning your call. You may leave a message with details.

## 2016-06-09 ENCOUNTER — Other Ambulatory Visit: Payer: Self-pay | Admitting: Internal Medicine

## 2016-07-21 DIAGNOSIS — Z1231 Encounter for screening mammogram for malignant neoplasm of breast: Secondary | ICD-10-CM | POA: Diagnosis not present

## 2016-07-21 LAB — HM MAMMOGRAPHY

## 2016-07-30 ENCOUNTER — Encounter: Payer: Self-pay | Admitting: Internal Medicine

## 2016-07-30 DIAGNOSIS — N6489 Other specified disorders of breast: Secondary | ICD-10-CM | POA: Diagnosis not present

## 2016-08-01 ENCOUNTER — Ambulatory Visit (INDEPENDENT_AMBULATORY_CARE_PROVIDER_SITE_OTHER): Payer: BLUE CROSS/BLUE SHIELD | Admitting: Internal Medicine

## 2016-08-01 ENCOUNTER — Encounter: Payer: Self-pay | Admitting: Internal Medicine

## 2016-08-01 VITALS — BP 122/60 | HR 58 | Temp 98.6°F | Resp 12 | Ht 64.37 in | Wt 141.6 lb

## 2016-08-01 DIAGNOSIS — I10 Essential (primary) hypertension: Secondary | ICD-10-CM | POA: Diagnosis not present

## 2016-08-01 DIAGNOSIS — E039 Hypothyroidism, unspecified: Secondary | ICD-10-CM | POA: Diagnosis not present

## 2016-08-01 DIAGNOSIS — E7800 Pure hypercholesterolemia, unspecified: Secondary | ICD-10-CM | POA: Diagnosis not present

## 2016-08-01 DIAGNOSIS — D510 Vitamin B12 deficiency anemia due to intrinsic factor deficiency: Secondary | ICD-10-CM

## 2016-08-01 DIAGNOSIS — M810 Age-related osteoporosis without current pathological fracture: Secondary | ICD-10-CM | POA: Diagnosis not present

## 2016-08-01 DIAGNOSIS — Z86010 Personal history of colonic polyps: Secondary | ICD-10-CM

## 2016-08-01 DIAGNOSIS — Z0000 Encounter for general adult medical examination without abnormal findings: Secondary | ICD-10-CM | POA: Diagnosis not present

## 2016-08-01 DIAGNOSIS — E2839 Other primary ovarian failure: Secondary | ICD-10-CM

## 2016-08-01 LAB — HEPATIC FUNCTION PANEL
ALT: 12 U/L (ref 0–35)
AST: 17 U/L (ref 0–37)
Albumin: 4.6 g/dL (ref 3.5–5.2)
Alkaline Phosphatase: 43 U/L (ref 39–117)
Bilirubin, Direct: 0.2 mg/dL (ref 0.0–0.3)
TOTAL PROTEIN: 7.4 g/dL (ref 6.0–8.3)
Total Bilirubin: 0.9 mg/dL (ref 0.2–1.2)

## 2016-08-01 LAB — TSH: TSH: 7.56 u[IU]/mL — ABNORMAL HIGH (ref 0.35–4.50)

## 2016-08-01 LAB — BASIC METABOLIC PANEL
BUN: 12 mg/dL (ref 6–23)
CALCIUM: 9.7 mg/dL (ref 8.4–10.5)
CO2: 25 mEq/L (ref 19–32)
CREATININE: 0.92 mg/dL (ref 0.40–1.20)
Chloride: 102 mEq/L (ref 96–112)
GFR: 65.09 mL/min (ref >60.00)
GLUCOSE: 90 mg/dL (ref 70–99)
Potassium: 4.3 mEq/L (ref 3.5–5.1)
Sodium: 133 mEq/L — ABNORMAL LOW (ref 135–145)

## 2016-08-01 LAB — VITAMIN D 25 HYDROXY (VIT D DEFICIENCY, FRACTURES): VITD: 45 ng/mL (ref 30.00–100.00)

## 2016-08-01 LAB — LIPID PANEL
CHOL/HDL RATIO: 3
Cholesterol: 193 mg/dL (ref 0–200)
HDL: 76 mg/dL (ref >39.00)
LDL CALC: 104 mg/dL — AB (ref 0–99)
NONHDL: 117.28
TRIGLYCERIDES: 66 mg/dL (ref 0.0–149.0)
VLDL: 13.2 mg/dL (ref 0.0–40.0)

## 2016-08-01 NOTE — Assessment & Plan Note (Addendum)
Physical today 08/01/16.  PAP 08/02/15 - negative with negative HPV.  Mammogram 4/18 ok.  Recommended f/u mammogram in one year.  She will call to schedule colonoscopy this summer.  Schedule bone density.

## 2016-08-01 NOTE — Progress Notes (Signed)
Patient ID: Wendy Nichols, female   DOB: [birth year], 66 y.o.   MRN: [id number]   Subjective:    Patient ID: Wendy Nichols, female    DOB: 1951/04/07, 66 y.o.   MRN: [id number]  HPI  Patient here for her physical exam.  She states she is doing well.  Feels good.  Tries to stay active.  No chest pain.  No sob.  No acid reflux.  No abdominal pain.  Bowels stable.  Recent fall.  Old fracture.  No residual pain from her fall.  Handling stress.     Past Medical History:  Diagnosis Date  . GERD (gastroesophageal reflux disease)   . History of colon polyps   . Hypercholesterolemia   . Hypertension   . Hypothyroidism   . Osteoporosis   . Pernicious anemia    Past Surgical History:  Procedure Laterality Date  . ABDOMINAL HYSTERECTOMY  1997   abnormal uterine bleeding.   Marland Kitchen BREAST BIOPSY     biopsies in both breasts  . DILATION AND CURETTAGE OF UTERUS  1979   Family History  Problem Relation Age of Onset  . Breast cancer Mother   . Kidney disease Mother   . Hypertension Mother   . Heart disease Mother   . Heart disease Father     myocardial infarction  . Breast cancer Maternal Aunt   . Breast cancer Maternal Grandmother   . Heart disease Brother     myocardial infarction  . Brain cancer Brother    Social History   Social History  . Marital status: Married    Spouse name: N/A  . Number of children: 3  . Years of education: N/A   Occupational History  .  [employer]   Social History Main Topics  . Smoking status: Never Smoker  . Smokeless tobacco: Never Used  . Alcohol use 0.0 oz/week     Comment: occasional wine  . Drug use: No  . Sexual activity: Not Asked   Other Topics Concern  . None   Social History Narrative  . None    Outpatient Encounter Prescriptions as of 08/01/2016  Medication Sig  . ALPRAZolam (XANAX) 0.25 MG tablet Take 1 tablet (0.25 mg total) by mouth daily as needed for anxiety.  Marland Kitchen aspirin 81 MG tablet Take 81 mg by mouth every other day.  .  cyanocobalamin (,VITAMIN B-12,) 1000 MCG/ML injection INJECT 1 ML (1,000 MCG TOTAL) INTO THE MUSCLE EVERY 30 (THIRTY) DAYS.  . fluticasone (FLONASE) 50 MCG/ACT nasal spray Place 2 sprays into both nostrils daily.  Marland Kitchen levothyroxine (SYNTHROID, LEVOTHROID) 100 MCG tablet TAKE 1 TABLET BY MOUTH DAILY.  Marland Kitchen lisinopril (PRINIVIL,ZESTRIL) 10 MG tablet TAKE 1 TABLET (10 MG TOTAL) BY MOUTH DAILY.  . metoprolol tartrate (LOPRESSOR) 25 MG tablet TAKE 1/2 TABLET BY MOUTH DAILY.  . simvastatin (ZOCOR) 10 MG tablet TAKE 1 TABLET (10 MG TOTAL) BY MOUTH EVERY EVENING.  . SYRINGE-NEEDLE, DISP, 3 ML 25G X 1" 3 ML MISC Dose and route does not apply.  To be used to inject 1064mcg/mL Cyanocobalamin IM once every 30 days   No facility-administered encounter medications on file as of 08/01/2016.     Review of Systems  Constitutional: Negative for appetite change and unexpected weight change.  HENT: Negative for congestion and sinus pressure.   Eyes: Negative for pain and visual disturbance.  Respiratory: Negative for cough, chest tightness and shortness of breath.   Cardiovascular: Negative for chest pain, palpitations and leg swelling.  Gastrointestinal:  Negative for abdominal pain, diarrhea, nausea and vomiting.  Genitourinary: Negative for difficulty urinating and dysuria.  Musculoskeletal: Negative for back pain and joint swelling.  Skin: Negative for color change and rash.  Neurological: Negative for dizziness, light-headedness and headaches.  Hematological: Negative for adenopathy. Does not bruise/bleed easily.  Psychiatric/Behavioral: Negative for agitation and dysphoric mood.       Objective:    Physical Exam  Constitutional: She is oriented to person, place, and time. She appears well-developed and well-nourished. No distress.  HENT:  Nose: Nose normal.  Mouth/Throat: Oropharynx is clear and moist.  Eyes: Right eye exhibits no discharge. Left eye exhibits no discharge. No scleral icterus.  Neck:  Neck supple. No thyromegaly present.  Cardiovascular: Normal rate and regular rhythm.   Pulmonary/Chest: Breath sounds normal. No accessory muscle usage. No tachypnea. No respiratory distress. She has no decreased breath sounds. She has no wheezes. She has no rhonchi. Right breast exhibits no inverted nipple, no mass, no nipple discharge and no tenderness (no axillary adenopathy). Left breast exhibits no inverted nipple, no mass, no nipple discharge and no tenderness (no axilarry adenopathy).  Abdominal: Soft. Bowel sounds are normal. There is no tenderness.  Musculoskeletal: She exhibits no edema or tenderness.  Lymphadenopathy:    She has no cervical adenopathy.  Neurological: She is alert and oriented to person, place, and time.  Skin: Skin is warm. No rash noted. No erythema.  Psychiatric: She has a normal mood and affect. Her behavior is normal.    BP 122/60 (BP Location: Left Arm, Patient Position: Sitting, Cuff Size: Normal)   Pulse (!) 58   Temp 98.6 F (37 C) (Oral)   Resp 12   Ht 5' 4.37" (1.635 m)   Wt 141 lb 9.6 oz (64.2 kg)   LMP 03/25/1996   SpO2 99%   BMI 24.03 kg/m  Wt Readings from Last 3 Encounters:  08/01/16 141 lb 9.6 oz (64.2 kg)  05/26/16 145 lb 6.4 oz (66 kg)  02/01/16 142 lb 9.6 oz (64.7 kg)     Lab Results  Component Value Date   WBC 4.6 02/01/2016   HGB 12.8 02/01/2016   HCT 37.2 02/01/2016   PLT 253.0 02/01/2016   GLUCOSE 90 08/01/2016   CHOL 193 08/01/2016   TRIG 66.0 08/01/2016   HDL 76.00 08/01/2016   LDLCALC 104 (H) 08/01/2016   ALT 12 08/01/2016   AST 17 08/01/2016   NA 133 (L) 08/01/2016   K 4.3 08/01/2016   CL 102 08/01/2016   CREATININE 0.92 08/01/2016   BUN 12 08/01/2016   CO2 25 08/01/2016   TSH 7.56 (H) 08/01/2016    Dg Lumbar Spine 2-3 Views  Result Date: 05/26/2016 CLINICAL DATA:  Pt states she fell off 8 ft ladder and is having lower back, left hip and tail bone and right wrist pain EXAM: LUMBAR SPINE - 2-3 VIEW  COMPARISON:  None. FINDINGS: There is no evidence of lumbar spine fracture. Alignment is normal. Intervertebral disc spaces are maintained. Facet DJD L4-5 and L5-S1. IMPRESSION: No acute findings Electronically Signed   By: Corlis Leak M.D.   On: 05/26/2016 19:59   Dg Sacrum/coccyx  Result Date: 05/26/2016 CLINICAL DATA:  Larey Seat off ladder, pain EXAM: SACRUM AND COCCYX - 2+ VIEW COMPARISON:  None. FINDINGS: There is no evidence of fracture or other focal bone lesions. IMPRESSION: Negative. Electronically Signed   By: Corlis Leak M.D.   On: 05/26/2016 19:32   Dg Wrist Complete Right  Result  Date: 05/26/2016 CLINICAL DATA:  Pt states she fell off 8 ft ladder and is having lower back, left hip and tail bone and right wrist pain EXAM: RIGHT WRIST - COMPLETE 3+ VIEW COMPARISON:  None. FINDINGS: Cortical discontinuity in the lunate with adjacent sclerosis consist with fracture, possibly subacute or chronic. No significant displacement. Carpal rows are intact. Otherwise normal mineralization and alignment. No other significant osseous degenerative change. IMPRESSION: 1. Lunate fracture, with sclerosis suggesting a nonacute component. Electronically Signed   By: Corlis Leak M.D.   On: 05/26/2016 16:10   Ct Head Wo Contrast  Result Date: 05/26/2016 CLINICAL DATA:  Head injury, fell off 8 foot ladder, hit occipital region EXAM: CT HEAD WITHOUT CONTRAST TECHNIQUE: Contiguous axial images were obtained from the base of the skull through the vertex without intravenous contrast. COMPARISON:  03/13/2012 FINDINGS: Brain: No intracranial hemorrhage, mass effect or midline shift. No acute cortical infarction. No mass lesion is noted on this unenhanced scan. Vascular: No hyperdense vessel or unexpected calcification. Skull: No skull fracture is noted. Sinuses/Orbits: No acute findings. No paranasal sinuses air-fluid levels. Other: No intraventricular hemorrhage. IMPRESSION: No acute intracranial abnormality. No mass lesion is  noted on this unenhanced scan. No skull fracture. Electronically Signed   By: Natasha Mead M.D.   On: 05/26/2016 16:42   Dg Hip Unilat With Pelvis 2-3 Views Left  Result Date: 05/26/2016 CLINICAL DATA:  Pt states she fell off 8 ft ladder and is having lower back, left hip and tail bone and right wrist pain EXAM: DG HIP (WITH OR WITHOUT PELVIS) 2-3V LEFT COMPARISON:  None. FINDINGS: There is no evidence of hip fracture or dislocation. There is no evidence of arthropathy or other focal bone abnormality. IMPRESSION: Negative. Electronically Signed   By: Corlis Leak M.D.   On: 05/26/2016 19:58       Assessment & Plan:   Problem List Items Addressed This Visit    Health care maintenance    Physical today 08/01/16.  PAP 08/02/15 - negative with negative HPV.  Mammogram 4/18 ok.  Recommended f/u mammogram in one year.  She will call to schedule colonoscopy this summer.  Schedule bone density.       History of colonic polyps    She will schedule a f/u colonoscopy this summer.        Hypercholesterolemia    Low cholesterol diet and exercise.  Follow lipid panel and liver function tests.  On simvastatin.        Relevant Orders   Lipid panel (Completed)   Hepatic function panel (Completed)   Hypertension    Blood pressure under good control.  Continue same medication regimen.  Follow pressures.  Follow metabolic panel.        Relevant Orders   Basic metabolic panel (Completed)   Hypothyroidism    On thyroid replacement.  Follow tsh.       Relevant Orders   TSH (Completed)   Osteoporosis    Discussed f/u bone density.  She is agreeable.  Dietary calcium.  Weight beating exercise.  Follow.        Relevant Orders   VITAMIN D 25 Hydroxy (Vit-D Deficiency, Fractures) (Completed)   Pernicious anemia    Continue B12 injections.  Follow cbc.         Other Visit Diagnoses    Estrogen deficiency    -  Primary   Relevant Orders   DG Bone Density       Dale Harrison, MD

## 2016-08-01 NOTE — Assessment & Plan Note (Signed)
Blood pressure under good control.  Continue same medication regimen.  Follow pressures.  Follow metabolic panel.   

## 2016-08-01 NOTE — Progress Notes (Signed)
Pre-visit discussion using our clinic review tool. No additional management support is needed unless otherwise documented below in the visit note.  

## 2016-08-02 ENCOUNTER — Other Ambulatory Visit: Payer: Self-pay | Admitting: Internal Medicine

## 2016-08-02 DIAGNOSIS — E871 Hypo-osmolality and hyponatremia: Secondary | ICD-10-CM

## 2016-08-02 NOTE — Progress Notes (Signed)
Order placed for f/u sodium.  ?

## 2016-08-03 ENCOUNTER — Encounter: Payer: Self-pay | Admitting: Internal Medicine

## 2016-08-03 NOTE — Assessment & Plan Note (Signed)
Continue B12 injections.  Follow cbc.  

## 2016-08-03 NOTE — Assessment & Plan Note (Signed)
Discussed f/u bone density.  She is agreeable.  Dietary calcium.  Weight beating exercise.  Follow.

## 2016-08-03 NOTE — Assessment & Plan Note (Signed)
Low cholesterol diet and exercise.  Follow lipid panel and liver function tests.  On simvastatin.   

## 2016-08-03 NOTE — Assessment & Plan Note (Signed)
On thyroid replacement.  Follow tsh.  

## 2016-08-03 NOTE — Assessment & Plan Note (Signed)
She will schedule a f/u colonoscopy this summer.

## 2016-08-04 MED ORDER — LEVOTHYROXINE SODIUM 112 MCG PO TABS: 112.0000 ug | ORAL_TABLET | Freq: Every day | ORAL | 0 refills | Status: DC

## 2016-08-04 NOTE — Addendum Note (Signed)
Addended by: Donnamarie Poag on: 08/04/2016 11:06 AM   Modules accepted: Orders

## 2016-08-13 ENCOUNTER — Other Ambulatory Visit: Payer: Self-pay | Admitting: Internal Medicine

## 2016-08-14 ENCOUNTER — Ambulatory Visit: Payer: BLUE CROSS/BLUE SHIELD

## 2016-08-14 ENCOUNTER — Other Ambulatory Visit: Payer: BLUE CROSS/BLUE SHIELD

## 2016-08-14 NOTE — Telephone Encounter (Signed)
Medication: xanax 0.25   Directions:1 po PRN Last given: 10-02-14 #20 Number refills: 0 Last o/v: 08-01-16 Follow up: 02/02/17

## 2016-08-15 NOTE — Telephone Encounter (Signed)
Please confirm with pt that she needs.  Ok to refill x 1, just need to confirm pt doing ok.

## 2016-08-19 ENCOUNTER — Other Ambulatory Visit: Payer: BLUE CROSS/BLUE SHIELD

## 2016-08-20 ENCOUNTER — Other Ambulatory Visit: Payer: BLUE CROSS/BLUE SHIELD

## 2016-08-20 ENCOUNTER — Ambulatory Visit: Payer: BLUE CROSS/BLUE SHIELD

## 2016-08-21 NOTE — Telephone Encounter (Signed)
Left message to return call to our office.  

## 2016-08-26 ENCOUNTER — Other Ambulatory Visit (INDEPENDENT_AMBULATORY_CARE_PROVIDER_SITE_OTHER): Payer: BLUE CROSS/BLUE SHIELD

## 2016-08-26 ENCOUNTER — Telehealth: Payer: Self-pay | Admitting: *Deleted

## 2016-08-26 ENCOUNTER — Ambulatory Visit (INDEPENDENT_AMBULATORY_CARE_PROVIDER_SITE_OTHER): Payer: BLUE CROSS/BLUE SHIELD | Admitting: *Deleted

## 2016-08-26 DIAGNOSIS — Z23 Encounter for immunization: Secondary | ICD-10-CM

## 2016-08-26 DIAGNOSIS — E871 Hypo-osmolality and hyponatremia: Secondary | ICD-10-CM

## 2016-08-26 LAB — SODIUM: SODIUM: 132 meq/L — AB (ref 135–145)

## 2016-08-26 NOTE — Progress Notes (Signed)
Patient tolerated vaccine well 

## 2016-08-26 NOTE — Telephone Encounter (Signed)
Called pt l/m to call office.  

## 2016-08-26 NOTE — Telephone Encounter (Signed)
Patient requested a call with reason of why she was called back in for sodium level. Patient stated that a message can be left on her voicemail  Pt contact [phone]

## 2016-08-27 ENCOUNTER — Other Ambulatory Visit: Payer: Self-pay | Admitting: Internal Medicine

## 2016-08-27 DIAGNOSIS — E871 Hypo-osmolality and hyponatremia: Secondary | ICD-10-CM

## 2016-08-27 NOTE — Progress Notes (Signed)
Order placed for f/u lab.   

## 2016-08-27 NOTE — Telephone Encounter (Signed)
Called patient gave all information she has lab app already

## 2016-09-06 ENCOUNTER — Other Ambulatory Visit: Payer: Self-pay | Admitting: Internal Medicine

## 2016-09-07 ENCOUNTER — Encounter: Payer: Self-pay | Admitting: Internal Medicine

## 2016-09-10 ENCOUNTER — Other Ambulatory Visit: Payer: Self-pay | Admitting: Internal Medicine

## 2016-09-22 ENCOUNTER — Other Ambulatory Visit (INDEPENDENT_AMBULATORY_CARE_PROVIDER_SITE_OTHER): Payer: BLUE CROSS/BLUE SHIELD

## 2016-09-22 DIAGNOSIS — E871 Hypo-osmolality and hyponatremia: Secondary | ICD-10-CM

## 2016-09-22 LAB — SODIUM: Sodium: 135 mEq/L (ref 135–145)

## 2016-09-23 ENCOUNTER — Other Ambulatory Visit: Payer: Self-pay | Admitting: Internal Medicine

## 2016-09-23 ENCOUNTER — Telehealth: Payer: Self-pay | Admitting: Radiology

## 2016-09-23 DIAGNOSIS — E039 Hypothyroidism, unspecified: Secondary | ICD-10-CM

## 2016-09-23 NOTE — Telephone Encounter (Signed)
Order placed for f/u tsh.  

## 2016-09-23 NOTE — Telephone Encounter (Signed)
Pt coming for labs tomorrow, please place future orders. Thank you 

## 2016-09-23 NOTE — Progress Notes (Signed)
Order placed for f/u tsh.  

## 2016-09-24 ENCOUNTER — Other Ambulatory Visit: Payer: BLUE CROSS/BLUE SHIELD

## 2016-10-22 ENCOUNTER — Ambulatory Visit: Payer: BLUE CROSS/BLUE SHIELD

## 2016-10-29 ENCOUNTER — Other Ambulatory Visit (INDEPENDENT_AMBULATORY_CARE_PROVIDER_SITE_OTHER): Payer: BLUE CROSS/BLUE SHIELD

## 2016-10-29 DIAGNOSIS — E039 Hypothyroidism, unspecified: Secondary | ICD-10-CM

## 2016-10-29 LAB — TSH: TSH: 4.18 u[IU]/mL (ref 0.35–4.50)

## 2016-10-30 ENCOUNTER — Telehealth: Payer: Self-pay | Admitting: Internal Medicine

## 2016-10-30 NOTE — Telephone Encounter (Signed)
Called l/m to call office see results note

## 2016-10-30 NOTE — Telephone Encounter (Signed)
Pt called back returning your call. Please advise, thank you!  Call pt @ [phone]

## 2016-11-05 ENCOUNTER — Other Ambulatory Visit: Payer: Self-pay | Admitting: Internal Medicine

## 2016-11-05 ENCOUNTER — Ambulatory Visit: Payer: BLUE CROSS/BLUE SHIELD

## 2016-11-19 ENCOUNTER — Ambulatory Visit
Admission: RE | Admit: 2016-11-19 | Discharge: 2016-11-19 | Disposition: A | Discharge status: Home / Self Care | Payer: BLUE CROSS/BLUE SHIELD | Source: Ambulatory Visit | Attending: Internal Medicine | Admitting: Internal Medicine

## 2016-11-19 DIAGNOSIS — Z13820 Encounter for screening for osteoporosis: Secondary | ICD-10-CM | POA: Insufficient documentation

## 2016-11-19 DIAGNOSIS — M8589 Other specified disorders of bone density and structure, multiple sites: Secondary | ICD-10-CM | POA: Diagnosis not present

## 2016-11-19 DIAGNOSIS — Z780 Asymptomatic menopausal state: Secondary | ICD-10-CM | POA: Diagnosis not present

## 2016-11-19 DIAGNOSIS — E2839 Other primary ovarian failure: Secondary | ICD-10-CM

## 2016-11-29 ENCOUNTER — Other Ambulatory Visit: Payer: Self-pay | Admitting: Internal Medicine

## 2016-12-11 ENCOUNTER — Telehealth: Payer: Self-pay | Admitting: Internal Medicine

## 2016-12-11 NOTE — Telephone Encounter (Signed)
Pt called and stated that she went for bone density as preventative and it was billed as medical. Pt states that she spoke with [employer] and they said that we could resubmit as preventative. Please advise, thank you!  Call pt @ [phone]

## 2017-01-01 NOTE — Telephone Encounter (Signed)
I spoke with the patient. I will ask the physician if it is possible to change her diagnosis to screening for osteoporosis.

## 2017-01-05 NOTE — Telephone Encounter (Signed)
The physician will review the patient's chart to see if the codes are correct.

## 2017-01-06 NOTE — Telephone Encounter (Signed)
Letter given to Dr. Lorin Picket to sign.

## 2017-01-07 NOTE — Telephone Encounter (Signed)
Letter sent to Hospital charge correction to have codes changed and re billed.

## 2017-02-02 ENCOUNTER — Ambulatory Visit (INDEPENDENT_AMBULATORY_CARE_PROVIDER_SITE_OTHER): Payer: BLUE CROSS/BLUE SHIELD | Admitting: Internal Medicine

## 2017-02-02 ENCOUNTER — Encounter: Payer: Self-pay | Admitting: Internal Medicine

## 2017-02-02 VITALS — BP 120/62 | HR 63 | Temp 98.3°F | Resp 12 | Ht 64.0 in | Wt 147.2 lb

## 2017-02-02 DIAGNOSIS — Z8639 Personal history of other endocrine, nutritional and metabolic disease: Secondary | ICD-10-CM | POA: Insufficient documentation

## 2017-02-02 DIAGNOSIS — E7800 Pure hypercholesterolemia, unspecified: Secondary | ICD-10-CM

## 2017-02-02 DIAGNOSIS — I10 Essential (primary) hypertension: Secondary | ICD-10-CM | POA: Diagnosis not present

## 2017-02-02 DIAGNOSIS — E039 Hypothyroidism, unspecified: Secondary | ICD-10-CM | POA: Diagnosis not present

## 2017-02-02 DIAGNOSIS — F439 Reaction to severe stress, unspecified: Secondary | ICD-10-CM | POA: Diagnosis not present

## 2017-02-02 DIAGNOSIS — Z23 Encounter for immunization: Secondary | ICD-10-CM

## 2017-02-02 DIAGNOSIS — M8580 Other specified disorders of bone density and structure, unspecified site: Secondary | ICD-10-CM

## 2017-02-02 LAB — LIPID PANEL
CHOLESTEROL: 173 mg/dL (ref 0–200)
HDL: 67.3 mg/dL (ref >39.00)
LDL Cholesterol: 90 mg/dL (ref 0–99)
NonHDL: 105.23
Total CHOL/HDL Ratio: 3
Triglycerides: 78 mg/dL (ref 0.0–149.0)
VLDL: 15.6 mg/dL (ref 0.0–40.0)

## 2017-02-02 LAB — BASIC METABOLIC PANEL
BUN: 15 mg/dL (ref 6–23)
CHLORIDE: 105 meq/L (ref 96–112)
CO2: 27 mEq/L (ref 19–32)
Calcium: 9.2 mg/dL (ref 8.4–10.5)
Creatinine, Ser: 0.78 mg/dL (ref 0.40–1.20)
GFR: 78.62 mL/min (ref >60.00)
Glucose, Bld: 106 mg/dL — ABNORMAL HIGH (ref 70–99)
POTASSIUM: 4.3 meq/L (ref 3.5–5.1)
SODIUM: 139 meq/L (ref 135–145)

## 2017-02-02 LAB — HEPATIC FUNCTION PANEL
ALBUMIN: 4.2 g/dL (ref 3.5–5.2)
ALT: 14 U/L (ref 0–35)
AST: 14 U/L (ref 0–37)
Alkaline Phosphatase: 36 U/L — ABNORMAL LOW (ref 39–117)
Bilirubin, Direct: 0.1 mg/dL (ref 0.0–0.3)
TOTAL PROTEIN: 6.6 g/dL (ref 6.0–8.3)
Total Bilirubin: 0.7 mg/dL (ref 0.2–1.2)

## 2017-02-02 LAB — TSH: TSH: 2.14 u[IU]/mL (ref 0.35–4.50)

## 2017-02-02 LAB — VITAMIN B12: VITAMIN B 12: 342 pg/mL (ref 211–911)

## 2017-02-02 LAB — VITAMIN D 25 HYDROXY (VIT D DEFICIENCY, FRACTURES): VITD: 33.05 ng/mL (ref 30.00–100.00)

## 2017-02-02 NOTE — Progress Notes (Signed)
Patient ID: Wendy Nichols, female   DOB: 03/13/1951, 66 y.o.   MRN: [id number]   Subjective:    Patient ID: Wendy Nichols, female    DOB: 09/26/1950, 66 y.o.   MRN: [id number]  HPI  Patient here for a scheduled follow up.  She is doing well.  Stays active.  No chest pain.  No sob.  No acid reflux.  No abdominal pain or cramping.  Bowels moving.  Discussed bone density results.  Discussed other treatment options.  Discussed dietary calcium and weight bearing exercise.  Discussed reclast.  She prefers to hold on prescription medication at this time.  Overall handling stress well.     Past Medical History:  Diagnosis Date  . GERD (gastroesophageal reflux disease)   . History of colon polyps   . Hypercholesterolemia   . Hypertension   . Hypothyroidism   . Osteoporosis   . Pernicious anemia    Past Surgical History:  Procedure Laterality Date  . ABDOMINAL HYSTERECTOMY  1997   abnormal uterine bleeding.   Marland Kitchen. BREAST BIOPSY     biopsies in both breasts  . DILATION AND CURETTAGE OF UTERUS  1979   Family History  Problem Relation Age of Onset  . Breast cancer Mother   . Kidney disease Mother   . Hypertension Mother   . Heart disease Mother   . Heart disease Father        myocardial infarction  . Breast cancer Maternal Aunt   . Breast cancer Maternal Grandmother   . Heart disease Brother        myocardial infarction  . Brain cancer Brother    Social History   Social History  . Marital status: Married    Spouse name: N/A  . Number of children: 3  . Years of education: N/A   Occupational History  .  [employer]   Social History Main Topics  . Smoking status: Never Smoker  . Smokeless tobacco: Never Used  . Alcohol use 0.0 oz/week     Comment: occasional wine  . Drug use: No  . Sexual activity: Not Asked   Other Topics Concern  . None   Social History Narrative  . None    Outpatient Encounter Prescriptions as of 02/02/2017  Medication Sig  . ALPRAZolam (XANAX)  0.25 MG tablet Take 1 tablet (0.25 mg total) by mouth daily as needed for anxiety.  Marland Kitchen. aspirin 81 MG tablet Take 81 mg by mouth every other day.  . cyanocobalamin (,VITAMIN B-12,) 1000 MCG/ML injection INJECT 1 ML (1,000 MCG TOTAL) INTO THE MUSCLE EVERY THIRTY DAYS.  . fluticasone (FLONASE) 50 MCG/ACT nasal spray Place 2 sprays into both nostrils daily.  Marland Kitchen. levothyroxine (SYNTHROID, LEVOTHROID) 100 MCG tablet TAKE 1 TABLET BY MOUTH DAILY.  Marland Kitchen. lisinopril (PRINIVIL,ZESTRIL) 10 MG tablet TAKE 1 TABLET (10 MG TOTAL) BY MOUTH DAILY.  . metoprolol tartrate (LOPRESSOR) 25 MG tablet TAKE 1/2 TABLET BY MOUTH DAILY.  . simvastatin (ZOCOR) 10 MG tablet TAKE 1 TABLET (10 MG TOTAL) BY MOUTH EVERY EVENING.  . SYRINGE-NEEDLE, DISP, 3 ML 25G X 1" 3 ML MISC Dose and route does not apply.  To be used to inject 105900mcg/mL Cyanocobalamin IM once every 30 days  . [DISCONTINUED] levothyroxine (SYNTHROID, LEVOTHROID) 112 MCG tablet TAKE 1 TABLET (112 MCG TOTAL) BY MOUTH DAILY.   No facility-administered encounter medications on file as of 02/02/2017.     Review of Systems  Constitutional: Negative for appetite change and unexpected weight change.  HENT: Negative for congestion and sinus pressure.   Respiratory: Negative for cough, chest tightness and shortness of breath.   Cardiovascular: Negative for chest pain, palpitations and leg swelling.  Gastrointestinal: Negative for abdominal pain, diarrhea, nausea and vomiting.  Genitourinary: Negative for difficulty urinating and dysuria.  Musculoskeletal: Negative for joint swelling and myalgias.  Skin: Negative for color change and rash.  Neurological: Negative for dizziness, light-headedness and headaches.  Psychiatric/Behavioral: Negative for agitation and dysphoric mood.       Objective:    Physical Exam  Constitutional: She appears well-developed and well-nourished. No distress.  HENT:  Nose: Nose normal.  Mouth/Throat: Oropharynx is clear and moist.  Neck:  Neck supple. No thyromegaly present.  Cardiovascular: Normal rate and regular rhythm.   Pulmonary/Chest: Breath sounds normal. No respiratory distress. She has no wheezes.  Abdominal: Soft. Bowel sounds are normal. There is no tenderness.  Musculoskeletal: She exhibits no edema or tenderness.  Lymphadenopathy:    She has no cervical adenopathy.  Skin: No rash noted. No erythema.  Psychiatric: She has a normal mood and affect. Her behavior is normal.    BP 120/62 (BP Location: Left Arm, Patient Position: Sitting, Cuff Size: Normal)   Pulse 63   Temp 98.3 F (36.8 C) (Oral)   Resp 12   Ht 5\' 4"  (1.626 m)   Wt 147 lb 3.2 oz (66.8 kg)   LMP 03/25/1996   SpO2 99%   BMI 25.27 kg/m  Wt Readings from Last 3 Encounters:  02/02/17 147 lb 3.2 oz (66.8 kg)  08/01/16 141 lb 9.6 oz (64.2 kg)  05/26/16 145 lb 6.4 oz (66 kg)     Lab Results  Component Value Date   WBC 4.6 02/01/2016   HGB 12.8 02/01/2016   HCT 37.2 02/01/2016   PLT 253.0 02/01/2016   GLUCOSE 106 (H) 02/02/2017   CHOL 173 02/02/2017   TRIG 78.0 02/02/2017   HDL 67.30 02/02/2017   LDLCALC 90 02/02/2017   ALT 14 02/02/2017   AST 14 02/02/2017   NA 139 02/02/2017   K 4.3 02/02/2017   CL 105 02/02/2017   CREATININE 0.78 02/02/2017   BUN 15 02/02/2017   CO2 27 02/02/2017   TSH 2.14 02/02/2017    Dg Bone Density  Result Date: 11/19/2016 EXAM: DUAL X-RAY ABSORPTIOMETRY (DXA) FOR BONE MINERAL DENSITY IMPRESSION: Dear Dr. Dale Chepachet, Your patient Wendy Nichols completed a FRAX assessment on 11/19/2016 using the Lunar iDXA DXA System (analysis version: 14.10) manufactured by [employer]. The following summarizes the results of our evaluation. PATIENT BIOGRAPHICAL: Name: Wendy Nichols, Wendy Nichols Patient ID: [id number] Birth Date: 1951/01/24 Height:    64.4 in. Gender:     Female    [age]       Weight:    141.6 lbs. Ethnicity:  White                            Exam Date: 11/19/2016 FRAX* RESULTS:  (version: 3.5)  10-year Probability of Fracture1 Major Osteoporotic Fracture2 Hip Fracture 19.1% 3.6% Population: [place] (Caucasian) Risk Factors: History of Fracture (Adult) Based on Femur (Right) Neck BMD 1 -The 10-year probability of fracture may be lower than reported if the patient has received treatment. 2 -Major Osteoporotic Fracture: Clinical Spine, Forearm, Hip or Shoulder *FRAX is a Armed forces logistics/support/administrative officer of the [employer] for Metabolic Bone Disease, a World Science writer (WHO)  HENT: Negative for congestion and sinus pressure.   Respiratory: Negative for cough, chest tightness and shortness of breath.   Cardiovascular: Negative for chest pain, palpitations and leg swelling.  Gastrointestinal: Negative for abdominal pain, diarrhea, nausea and vomiting.  Genitourinary: Negative for difficulty urinating and dysuria.  Musculoskeletal: Negative for joint swelling and myalgias.  Skin: Negative for color change and rash.  Neurological: Negative for dizziness, light-headedness and headaches.  Psychiatric/Behavioral: Negative for agitation and dysphoric mood.       Objective:    Physical Exam  Constitutional: She appears well-developed and well-nourished. No distress.  HENT:  Nose: Nose normal.  Mouth/Throat: Oropharynx is clear and moist.  Neck:  Neck supple. No thyromegaly present.  Cardiovascular: Normal rate and regular rhythm.   Pulmonary/Chest: Breath sounds normal. No respiratory distress. She has no wheezes.  Abdominal: Soft. Bowel sounds are normal. There is no tenderness.  Musculoskeletal: She exhibits no edema or tenderness.  Lymphadenopathy:    She has no cervical adenopathy.  Skin: No rash noted. No erythema.  Psychiatric: She has a normal mood and affect. Her behavior is normal.    BP 120/62 (BP Location: Left Arm, Patient Position: Sitting, Cuff Size: Normal)   Pulse 63   Temp 98.3 F (36.8 C) (Oral)   Resp 12   Ht 5\' 4"  (1.626 m)   Wt 147 lb 3.2 oz (66.8 kg)   LMP 03/25/1996   SpO2 99%   BMI 25.27 kg/m  Wt Readings from Last 3 Encounters:  02/02/17 147 lb 3.2 oz (66.8 kg)  08/01/16 141 lb 9.6 oz (64.2 kg)  05/26/16 145 lb 6.4 oz (66 kg)     Lab Results  Component Value Date   WBC 4.6 02/01/2016   HGB 12.8 02/01/2016   HCT 37.2 02/01/2016   PLT 253.0 02/01/2016   GLUCOSE 106 (H) 02/02/2017   CHOL 173 02/02/2017   TRIG 78.0 02/02/2017   HDL 67.30 02/02/2017   LDLCALC 90 02/02/2017   ALT 14 02/02/2017   AST 14 02/02/2017   NA 139 02/02/2017   K 4.3 02/02/2017   CL 105 02/02/2017   CREATININE 0.78 02/02/2017   BUN 15 02/02/2017   CO2 27 02/02/2017   TSH 2.14 02/02/2017    Dg Bone Density  Result Date: 11/19/2016 EXAM: DUAL X-RAY ABSORPTIOMETRY (DXA) FOR BONE MINERAL DENSITY IMPRESSION: Dear Dr. Dale Chepachet, Your patient Wendy Nichols completed a FRAX assessment on 11/19/2016 using the Lunar iDXA DXA System (analysis version: 14.10) manufactured by [employer]. The following summarizes the results of our evaluation. PATIENT BIOGRAPHICAL: Name: Wendy Nichols, Wendy Nichols Patient ID: [id number] Birth Date: 1951/01/24 Height:    64.4 in. Gender:     Female    [age]       Weight:    141.6 lbs. Ethnicity:  White                            Exam Date: 11/19/2016 FRAX* RESULTS:  (version: 3.5)  10-year Probability of Fracture1 Major Osteoporotic Fracture2 Hip Fracture 19.1% 3.6% Population: [place] (Caucasian) Risk Factors: History of Fracture (Adult) Based on Femur (Right) Neck BMD 1 -The 10-year probability of fracture may be lower than reported if the patient has received treatment. 2 -Major Osteoporotic Fracture: Clinical Spine, Forearm, Hip or Shoulder *FRAX is a Armed forces logistics/support/administrative officer of the [employer] for Metabolic Bone Disease, a World Science writer (WHO)  HENT: Negative for congestion and sinus pressure.   Respiratory: Negative for cough, chest tightness and shortness of breath.   Cardiovascular: Negative for chest pain, palpitations and leg swelling.  Gastrointestinal: Negative for abdominal pain, diarrhea, nausea and vomiting.  Genitourinary: Negative for difficulty urinating and dysuria.  Musculoskeletal: Negative for joint swelling and myalgias.  Skin: Negative for color change and rash.  Neurological: Negative for dizziness, light-headedness and headaches.  Psychiatric/Behavioral: Negative for agitation and dysphoric mood.       Objective:    Physical Exam  Constitutional: She appears well-developed and well-nourished. No distress.  HENT:  Nose: Nose normal.  Mouth/Throat: Oropharynx is clear and moist.  Neck:  Neck supple. No thyromegaly present.  Cardiovascular: Normal rate and regular rhythm.   Pulmonary/Chest: Breath sounds normal. No respiratory distress. She has no wheezes.  Abdominal: Soft. Bowel sounds are normal. There is no tenderness.  Musculoskeletal: She exhibits no edema or tenderness.  Lymphadenopathy:    She has no cervical adenopathy.  Skin: No rash noted. No erythema.  Psychiatric: She has a normal mood and affect. Her behavior is normal.    BP 120/62 (BP Location: Left Arm, Patient Position: Sitting, Cuff Size: Normal)   Pulse 63   Temp 98.3 F (36.8 C) (Oral)   Resp 12   Ht 5\' 4"  (1.626 m)   Wt 147 lb 3.2 oz (66.8 kg)   LMP 03/25/1996   SpO2 99%   BMI 25.27 kg/m  Wt Readings from Last 3 Encounters:  02/02/17 147 lb 3.2 oz (66.8 kg)  08/01/16 141 lb 9.6 oz (64.2 kg)  05/26/16 145 lb 6.4 oz (66 kg)     Lab Results  Component Value Date   WBC 4.6 02/01/2016   HGB 12.8 02/01/2016   HCT 37.2 02/01/2016   PLT 253.0 02/01/2016   GLUCOSE 106 (H) 02/02/2017   CHOL 173 02/02/2017   TRIG 78.0 02/02/2017   HDL 67.30 02/02/2017   LDLCALC 90 02/02/2017   ALT 14 02/02/2017   AST 14 02/02/2017   NA 139 02/02/2017   K 4.3 02/02/2017   CL 105 02/02/2017   CREATININE 0.78 02/02/2017   BUN 15 02/02/2017   CO2 27 02/02/2017   TSH 2.14 02/02/2017    Dg Bone Density  Result Date: 11/19/2016 EXAM: DUAL X-RAY ABSORPTIOMETRY (DXA) FOR BONE MINERAL DENSITY IMPRESSION: Dear Dr. Dale Chepachet, Your patient Wendy Nichols completed a FRAX assessment on 11/19/2016 using the Lunar iDXA DXA System (analysis version: 14.10) manufactured by [employer]. The following summarizes the results of our evaluation. PATIENT BIOGRAPHICAL: Name: Wendy Nichols, Wendy Nichols Patient ID: [id number] Birth Date: 1951/01/24 Height:    64.4 in. Gender:     Female    [age]       Weight:    141.6 lbs. Ethnicity:  White                            Exam Date: 11/19/2016 FRAX* RESULTS:  (version: 3.5)  10-year Probability of Fracture1 Major Osteoporotic Fracture2 Hip Fracture 19.1% 3.6% Population: [place] (Caucasian) Risk Factors: History of Fracture (Adult) Based on Femur (Right) Neck BMD 1 -The 10-year probability of fracture may be lower than reported if the patient has received treatment. 2 -Major Osteoporotic Fracture: Clinical Spine, Forearm, Hip or Shoulder *FRAX is a Armed forces logistics/support/administrative officer of the [employer] for Metabolic Bone Disease, a World Science writer (WHO)

## 2017-02-03 ENCOUNTER — Other Ambulatory Visit: Payer: Self-pay | Admitting: Internal Medicine

## 2017-02-03 DIAGNOSIS — R739 Hyperglycemia, unspecified: Secondary | ICD-10-CM

## 2017-02-03 NOTE — Progress Notes (Signed)
Order placed for f/u labs.  

## 2017-02-04 ENCOUNTER — Encounter: Payer: Self-pay | Admitting: Internal Medicine

## 2017-02-04 NOTE — Assessment & Plan Note (Signed)
On thyroid replacement.  Follow tsh.  

## 2017-02-04 NOTE — Assessment & Plan Note (Signed)
Low cholesterol diet and exercise.  Follow lipid panel and liver function test.  On simvastatin.

## 2017-02-04 NOTE — Assessment & Plan Note (Signed)
Follow vitamin D level.  

## 2017-02-04 NOTE — Assessment & Plan Note (Signed)
Continue B12. 

## 2017-02-04 NOTE — Assessment & Plan Note (Signed)
Blood pressure under good control.  Continue same medication regimen.  Follow pressures.  Follow metabolic panel.   

## 2017-02-04 NOTE — Assessment & Plan Note (Signed)
Handling things well.  Follow.   

## 2017-02-04 NOTE — Assessment & Plan Note (Signed)
Discussed bone density results.  Discussed treatment options. She prefers to hold on starting prescription medication.  Continue dietary calcium and weight bearing exercise.

## 2017-02-08 ENCOUNTER — Other Ambulatory Visit: Payer: Self-pay | Admitting: Internal Medicine

## 2017-03-02 ENCOUNTER — Other Ambulatory Visit (INDEPENDENT_AMBULATORY_CARE_PROVIDER_SITE_OTHER): Payer: BLUE CROSS/BLUE SHIELD

## 2017-03-02 DIAGNOSIS — R739 Hyperglycemia, unspecified: Secondary | ICD-10-CM

## 2017-03-02 NOTE — Addendum Note (Signed)
Addended by: Penne LashWIGGINS, Mitchel Delduca N on: 03/02/2017 08:04 AM   Modules accepted: Orders

## 2017-03-03 LAB — HEMOGLOBIN A1C
Hgb A1c MFr Bld: 5.3 % of total Hgb (ref <5.7)
Mean Plasma Glucose: 105 (calc)
eAG (mmol/L): 5.8 (calc)

## 2017-03-03 LAB — GLUCOSE, FASTING: Glucose, Plasma: 107 mg/dL — ABNORMAL HIGH (ref 65–99)

## 2017-03-07 ENCOUNTER — Other Ambulatory Visit: Payer: Self-pay | Admitting: Internal Medicine

## 2017-03-10 ENCOUNTER — Other Ambulatory Visit: Payer: Self-pay | Admitting: Internal Medicine

## 2017-03-15 ENCOUNTER — Other Ambulatory Visit: Payer: Self-pay | Admitting: Internal Medicine

## 2017-05-13 ENCOUNTER — Other Ambulatory Visit: Payer: Self-pay | Admitting: Internal Medicine

## 2017-06-15 DIAGNOSIS — D485 Neoplasm of uncertain behavior of skin: Secondary | ICD-10-CM | POA: Diagnosis not present

## 2017-06-15 DIAGNOSIS — D225 Melanocytic nevi of trunk: Secondary | ICD-10-CM | POA: Diagnosis not present

## 2017-06-15 DIAGNOSIS — L570 Actinic keratosis: Secondary | ICD-10-CM | POA: Diagnosis not present

## 2017-06-15 DIAGNOSIS — C44622 Squamous cell carcinoma of skin of right upper limb, including shoulder: Secondary | ICD-10-CM | POA: Diagnosis not present

## 2017-06-20 DIAGNOSIS — J019 Acute sinusitis, unspecified: Secondary | ICD-10-CM | POA: Diagnosis not present

## 2017-07-14 DIAGNOSIS — D0461 Carcinoma in situ of skin of right upper limb, including shoulder: Secondary | ICD-10-CM | POA: Diagnosis not present

## 2017-07-14 DIAGNOSIS — L905 Scar conditions and fibrosis of skin: Secondary | ICD-10-CM | POA: Diagnosis not present

## 2017-07-14 DIAGNOSIS — C44622 Squamous cell carcinoma of skin of right upper limb, including shoulder: Secondary | ICD-10-CM | POA: Diagnosis not present

## 2017-07-29 DIAGNOSIS — Z803 Family history of malignant neoplasm of breast: Secondary | ICD-10-CM | POA: Diagnosis not present

## 2017-07-29 DIAGNOSIS — Z1231 Encounter for screening mammogram for malignant neoplasm of breast: Secondary | ICD-10-CM | POA: Diagnosis not present

## 2017-07-29 LAB — HM MAMMOGRAPHY

## 2017-07-30 ENCOUNTER — Other Ambulatory Visit: Payer: Self-pay | Admitting: Internal Medicine

## 2017-07-30 ENCOUNTER — Telehealth: Payer: Self-pay | Admitting: Internal Medicine

## 2017-07-30 NOTE — Telephone Encounter (Signed)
Copied from CRM [phone]. Topic: Quick Communication - Rx Refill/Question >> Jul 30, 2017  3:41 PM Rudi CocoLathan, Antaniya Venuti M, [place] wrote: Medication: cyanocobalamin (,VITAMIN B-12,) 1000 MCG/ML injection [[id number]][[id number]]  Has the patient contacted their pharmacy? no (Agent: If no, request that the patient contact the pharmacy for the refill.) Preferred Pharmacy (with phone number or street name): [id number] [place], [place] - [address] [place] [id number] Phone: [phone] Fax: [phone]   Agent: Please be advised that RX refills may take up to 3 business days. We ask that you follow-up with your pharmacy.

## 2017-07-30 NOTE — Telephone Encounter (Signed)
Copied from CRM [id number]. Topic: Quick Communication - See Telephone Encounter >> Jul 30, 2017  3:38 PM Rudi CocoLathan, Ara Mano M, NT wrote: CRM for notification. See Telephone encounter for: 07/30/17.  Pt. Calling about results from mammogram done on yesterday 07/29/17 at [facility]. Can be reached [phone]

## 2017-07-30 NOTE — Telephone Encounter (Signed)
Vita B12 Injection; last refill 09/10/16; 10 ml.; 0 refills Last office visit: 02/02/17 PCP Dr. Lorin PicketScott [facility] [place] [facility] [address]   Last B12 level 02/02/17  Not addressed in refill protocol; will send to office.

## 2017-07-30 NOTE — Telephone Encounter (Signed)
Please advise 

## 2017-08-03 ENCOUNTER — Encounter: Payer: Self-pay | Admitting: Internal Medicine

## 2017-08-03 MED ORDER — CYANOCOBALAMIN 1000 MCG/ML IJ SOLN: INTRAMUSCULAR | 0 refills | Status: DC

## 2017-08-03 NOTE — Telephone Encounter (Signed)
Left message to return call for more info. Also asked pt on voicemail to see if they would fax over the results to our office.

## 2017-08-07 NOTE — Telephone Encounter (Signed)
Called patient to let her know that we have not yet received mammo results. She stated she is on vacation and will back Monday.

## 2017-08-14 ENCOUNTER — Other Ambulatory Visit: Payer: Self-pay | Admitting: Internal Medicine

## 2017-08-14 NOTE — Telephone Encounter (Signed)
Left message to f/u with patient.

## 2017-08-17 ENCOUNTER — Encounter: Payer: BLUE CROSS/BLUE SHIELD | Admitting: Internal Medicine

## 2017-08-17 NOTE — Telephone Encounter (Signed)
Left msg letting patient know that mammogram was normal.

## 2017-09-15 ENCOUNTER — Other Ambulatory Visit: Payer: Self-pay | Admitting: Internal Medicine

## 2017-12-08 ENCOUNTER — Telehealth: Payer: Self-pay | Admitting: Radiology

## 2017-12-08 ENCOUNTER — Ambulatory Visit (INDEPENDENT_AMBULATORY_CARE_PROVIDER_SITE_OTHER): Payer: BLUE CROSS/BLUE SHIELD | Admitting: Internal Medicine

## 2017-12-08 ENCOUNTER — Encounter

## 2017-12-08 ENCOUNTER — Encounter: Payer: Self-pay | Admitting: Internal Medicine

## 2017-12-08 VITALS — BP 130/78 | HR 60 | Temp 98.0°F | Resp 18 | Ht 64.0 in | Wt 149.8 lb

## 2017-12-08 DIAGNOSIS — I10 Essential (primary) hypertension: Secondary | ICD-10-CM

## 2017-12-08 DIAGNOSIS — Z1211 Encounter for screening for malignant neoplasm of colon: Secondary | ICD-10-CM

## 2017-12-08 DIAGNOSIS — M8580 Other specified disorders of bone density and structure, unspecified site: Secondary | ICD-10-CM

## 2017-12-08 DIAGNOSIS — Z23 Encounter for immunization: Secondary | ICD-10-CM | POA: Diagnosis not present

## 2017-12-08 DIAGNOSIS — R739 Hyperglycemia, unspecified: Secondary | ICD-10-CM

## 2017-12-08 DIAGNOSIS — E7800 Pure hypercholesterolemia, unspecified: Secondary | ICD-10-CM | POA: Diagnosis not present

## 2017-12-08 DIAGNOSIS — Z8639 Personal history of other endocrine, nutritional and metabolic disease: Secondary | ICD-10-CM | POA: Diagnosis not present

## 2017-12-08 DIAGNOSIS — F439 Reaction to severe stress, unspecified: Secondary | ICD-10-CM

## 2017-12-08 DIAGNOSIS — Z0000 Encounter for general adult medical examination without abnormal findings: Secondary | ICD-10-CM

## 2017-12-08 DIAGNOSIS — E039 Hypothyroidism, unspecified: Secondary | ICD-10-CM

## 2017-12-08 MED ORDER — ALPRAZOLAM 0.25 MG PO TABS: 0.2500 mg | ORAL_TABLET | Freq: Every day | ORAL | 0 refills | Status: DC | PRN

## 2017-12-08 NOTE — Progress Notes (Signed)
Patient ID: Wendy Nichols, female   DOB: 1950/12/22, 67 y.o.   MRN: [id number]   Subjective:    Patient ID: Wendy Nichols, female    DOB: [birth year], 67 y.o.   MRN: [id number]  HPI  Patient here for her physical exam.  She reports she is doing relatively well.  Tries to stay active.  No chest pain.  No sob.  No acid reflux.  No abdominal pain.  Bowels moving.  Due colonoscopy.  Increased stress with family issues.  Discussed with her today.  Overall she feels she is handling things relatively well.     Past Medical History:  Diagnosis Date  . GERD (gastroesophageal reflux disease)   . History of colon polyps   . Hypercholesterolemia   . Hypertension   . Hypothyroidism   . Osteoporosis   . Pernicious anemia    Past Surgical History:  Procedure Laterality Date  . ABDOMINAL HYSTERECTOMY  1997   abnormal uterine bleeding.   Marland Kitchen BREAST BIOPSY     biopsies in both breasts  . DILATION AND CURETTAGE OF UTERUS  1979   Family History  Problem Relation Age of Onset  . Breast cancer Mother   . Kidney disease Mother   . Hypertension Mother   . Heart disease Mother   . Heart disease Father        myocardial infarction  . Breast cancer Maternal Aunt   . Breast cancer Maternal Grandmother   . Heart disease Brother        myocardial infarction  . Brain cancer Brother    Social History   Socioeconomic History  . Marital status: Married    Spouse name: Not on file  . Number of children: 3  . Years of education: Not on file  . Highest education level: Not on file  Occupational History    Employer: [employer]  Social Needs  . Financial resource strain: Not on file  . Food insecurity:    Worry: Not on file    Inability: Not on file  . Transportation needs:    Medical: Not on file    Non-medical: Not on file  Tobacco Use  . Smoking status: Never Smoker  . Smokeless tobacco: Never Used  Substance and Sexual Activity  . Alcohol use: Yes    Alcohol/week: 0.0 standard drinks   Comment: occasional wine  . Drug use: No  . Sexual activity: Not on file  Lifestyle  . Physical activity:    Days per week: Not on file    Minutes per session: Not on file  . Stress: Not on file  Relationships  . Social connections:    Talks on phone: Not on file    Gets together: Not on file    Attends religious service: Not on file    Active member of club or organization: Not on file    Attends meetings of clubs or organizations: Not on file    Relationship status: Not on file  Other Topics Concern  . Not on file  Social History Narrative  . Not on file    Outpatient Encounter Medications as of 12/08/2017  Medication Sig  . ALPRAZolam (XANAX) 0.25 MG tablet Take 1 tablet (0.25 mg total) by mouth daily as needed for anxiety.  Marland Kitchen aspirin 81 MG tablet Take 81 mg by mouth every other day.  . cyanocobalamin (,VITAMIN B-12,) 1000 MCG/ML injection Inject 1 ml (1000 mcg total) into muscle every thirty days.  . fluticasone (FLONASE) 50  MCG/ACT nasal spray Place 2 sprays into both nostrils daily.  Marland Kitchen levothyroxine (SYNTHROID, LEVOTHROID) 100 MCG tablet TAKE 1 TABLET BY MOUTH DAILY.  Marland Kitchen lisinopril (PRINIVIL,ZESTRIL) 10 MG tablet TAKE 1 TABLET (10 MG TOTAL) BY MOUTH DAILY.  . metoprolol tartrate (LOPRESSOR) 25 MG tablet TAKE 1/2 TABLET BY MOUTH DAILY.  . simvastatin (ZOCOR) 10 MG tablet TAKE 1 TABLET (10 MG TOTAL) BY MOUTH EVERY EVENING.  . SYRINGE-NEEDLE, DISP, 3 ML 25G X 1" 3 ML MISC Dose and route does not apply.  To be used to inject 1058mcg/mL Cyanocobalamin IM once every 30 days  . [DISCONTINUED] ALPRAZolam (XANAX) 0.25 MG tablet Take 1 tablet (0.25 mg total) by mouth daily as needed for anxiety.   No facility-administered encounter medications on file as of 12/08/2017.     Review of Systems     Objective:    Physical Exam  BP 130/78 (BP Location: Left Arm, Patient Position: Sitting, Cuff Size: Normal)   Pulse 60   Temp 98 F (36.7 C) (Oral)   Resp 18   Ht 5\' 4"  (1.626 m)    Wt 149 lb 12.8 oz (67.9 kg)   LMP 03/25/1996   SpO2 99%   BMI 25.71 kg/m  Wt Readings from Last 3 Encounters:  12/08/17 149 lb 12.8 oz (67.9 kg)  02/02/17 147 lb 3.2 oz (66.8 kg)  08/01/16 141 lb 9.6 oz (64.2 kg)     Lab Results  Component Value Date   WBC 6.1 12/09/2017   HGB 12.3 12/09/2017   HCT 36.1 12/09/2017   PLT 252.0 12/09/2017   GLUCOSE 101 (H) 12/09/2017   CHOL 170 12/09/2017   TRIG 58.0 12/09/2017   HDL 72.60 12/09/2017   LDLCALC 86 12/09/2017   ALT 13 12/09/2017   AST 16 12/09/2017   NA 137 12/09/2017   K 4.5 12/09/2017   CL 105 12/09/2017   CREATININE 0.94 12/09/2017   BUN 12 12/09/2017   CO2 24 12/09/2017   TSH 2.14 02/02/2017   HGBA1C 5.5 12/09/2017    Dg Bone Density  Result Date: 11/19/2016 EXAM: DUAL X-RAY ABSORPTIOMETRY (DXA) FOR BONE MINERAL DENSITY IMPRESSION: Dear Dr. Dale Caledonia, Your patient Wendy Nichols completed a FRAX assessment on 11/19/2016 using the Lunar iDXA DXA System (analysis version: 14.10) manufactured by [employer]. The following summarizes the results of our evaluation. PATIENT BIOGRAPHICAL: Name: Wendy Nichols, Wendy Nichols Patient ID: [id number] Birth Date: 03/14/1951 Height:    64.4 in. Gender:     Female    [age]       Weight:    141.6 lbs. Ethnicity:  White                            Exam Date: 11/19/2016 FRAX* RESULTS:  (version: 3.5) 10-year Probability of Fracture1 Major Osteoporotic Fracture2 Hip Fracture 19.1% 3.6% Population: [place] (Caucasian) Risk Factors: History of Fracture (Adult) Based on Femur (Right) Neck BMD 1 -The 10-year probability of fracture may be lower than reported if the patient has received treatment. 2 -Major Osteoporotic Fracture: Clinical Spine, Forearm, Hip or Shoulder *FRAX is a Armed forces logistics/support/administrative officer of the [employer] for Metabolic Bone Disease, a World Science writer (WHO) [employer]. ASSESSMENT: The probability of a major osteoporotic fracture is 19.1%  within the next ten years. The probability of a hip fracture is 3.6% within the next ten years. . Dear Dr. Dale La Grange, Your  MCG/ACT nasal spray Place 2 sprays into both nostrils daily.  Marland Kitchen levothyroxine (SYNTHROID, LEVOTHROID) 100 MCG tablet TAKE 1 TABLET BY MOUTH DAILY.  Marland Kitchen lisinopril (PRINIVIL,ZESTRIL) 10 MG tablet TAKE 1 TABLET (10 MG TOTAL) BY MOUTH DAILY.  . metoprolol tartrate (LOPRESSOR) 25 MG tablet TAKE 1/2 TABLET BY MOUTH DAILY.  . simvastatin (ZOCOR) 10 MG tablet TAKE 1 TABLET (10 MG TOTAL) BY MOUTH EVERY EVENING.  . SYRINGE-NEEDLE, DISP, 3 ML 25G X 1" 3 ML MISC Dose and route does not apply.  To be used to inject 1058mcg/mL Cyanocobalamin IM once every 30 days  . [DISCONTINUED] ALPRAZolam (XANAX) 0.25 MG tablet Take 1 tablet (0.25 mg total) by mouth daily as needed for anxiety.   No facility-administered encounter medications on file as of 12/08/2017.     Review of Systems     Objective:    Physical Exam  BP 130/78 (BP Location: Left Arm, Patient Position: Sitting, Cuff Size: Normal)   Pulse 60   Temp 98 F (36.7 C) (Oral)   Resp 18   Ht 5\' 4"  (1.626 m)    Wt 149 lb 12.8 oz (67.9 kg)   LMP 03/25/1996   SpO2 99%   BMI 25.71 kg/m  Wt Readings from Last 3 Encounters:  12/08/17 149 lb 12.8 oz (67.9 kg)  02/02/17 147 lb 3.2 oz (66.8 kg)  08/01/16 141 lb 9.6 oz (64.2 kg)     Lab Results  Component Value Date   WBC 6.1 12/09/2017   HGB 12.3 12/09/2017   HCT 36.1 12/09/2017   PLT 252.0 12/09/2017   GLUCOSE 101 (H) 12/09/2017   CHOL 170 12/09/2017   TRIG 58.0 12/09/2017   HDL 72.60 12/09/2017   LDLCALC 86 12/09/2017   ALT 13 12/09/2017   AST 16 12/09/2017   NA 137 12/09/2017   K 4.5 12/09/2017   CL 105 12/09/2017   CREATININE 0.94 12/09/2017   BUN 12 12/09/2017   CO2 24 12/09/2017   TSH 2.14 02/02/2017   HGBA1C 5.5 12/09/2017    Dg Bone Density  Result Date: 11/19/2016 EXAM: DUAL X-RAY ABSORPTIOMETRY (DXA) FOR BONE MINERAL DENSITY IMPRESSION: Dear Dr. Dale Caledonia, Your patient Wendy Nichols completed a FRAX assessment on 11/19/2016 using the Lunar iDXA DXA System (analysis version: 14.10) manufactured by [employer]. The following summarizes the results of our evaluation. PATIENT BIOGRAPHICAL: Name: Wendy Nichols, Wendy Nichols Patient ID: [id number] Birth Date: 03/14/1951 Height:    64.4 in. Gender:     Female    [age]       Weight:    141.6 lbs. Ethnicity:  White                            Exam Date: 11/19/2016 FRAX* RESULTS:  (version: 3.5) 10-year Probability of Fracture1 Major Osteoporotic Fracture2 Hip Fracture 19.1% 3.6% Population: [place] (Caucasian) Risk Factors: History of Fracture (Adult) Based on Femur (Right) Neck BMD 1 -The 10-year probability of fracture may be lower than reported if the patient has received treatment. 2 -Major Osteoporotic Fracture: Clinical Spine, Forearm, Hip or Shoulder *FRAX is a Armed forces logistics/support/administrative officer of the [employer] for Metabolic Bone Disease, a World Science writer (WHO) [employer]. ASSESSMENT: The probability of a major osteoporotic fracture is 19.1%  within the next ten years. The probability of a hip fracture is 3.6% within the next ten years. . Dear Dr. Dale La Gr[place], Your  MCG/ACT nasal spray Place 2 sprays into both nostrils daily.  Marland Kitchen levothyroxine (SYNTHROID, LEVOTHROID) 100 MCG tablet TAKE 1 TABLET BY MOUTH DAILY.  Marland Kitchen lisinopril (PRINIVIL,ZESTRIL) 10 MG tablet TAKE 1 TABLET (10 MG TOTAL) BY MOUTH DAILY.  . metoprolol tartrate (LOPRESSOR) 25 MG tablet TAKE 1/2 TABLET BY MOUTH DAILY.  . simvastatin (ZOCOR) 10 MG tablet TAKE 1 TABLET (10 MG TOTAL) BY MOUTH EVERY EVENING.  . SYRINGE-NEEDLE, DISP, 3 ML 25G X 1" 3 ML MISC Dose and route does not apply.  To be used to inject 1058mcg/mL Cyanocobalamin IM once every 30 days  . [DISCONTINUED] ALPRAZolam (XANAX) 0.25 MG tablet Take 1 tablet (0.25 mg total) by mouth daily as needed for anxiety.   No facility-administered encounter medications on file as of 12/08/2017.     Review of Systems     Objective:    Physical Exam  BP 130/78 (BP Location: Left Arm, Patient Position: Sitting, Cuff Size: Normal)   Pulse 60   Temp 98 F (36.7 C) (Oral)   Resp 18   Ht 5\' 4"  (1.626 m)    Wt 149 lb 12.8 oz (67.9 kg)   LMP 03/25/1996   SpO2 99%   BMI 25.71 kg/m  Wt Readings from Last 3 Encounters:  12/08/17 149 lb 12.8 oz (67.9 kg)  02/02/17 147 lb 3.2 oz (66.8 kg)  08/01/16 141 lb 9.6 oz (64.2 kg)     Lab Results  Component Value Date   WBC 6.1 12/09/2017   HGB 12.3 12/09/2017   HCT 36.1 12/09/2017   PLT 252.0 12/09/2017   GLUCOSE 101 (H) 12/09/2017   CHOL 170 12/09/2017   TRIG 58.0 12/09/2017   HDL 72.60 12/09/2017   LDLCALC 86 12/09/2017   ALT 13 12/09/2017   AST 16 12/09/2017   NA 137 12/09/2017   K 4.5 12/09/2017   CL 105 12/09/2017   CREATININE 0.94 12/09/2017   BUN 12 12/09/2017   CO2 24 12/09/2017   TSH 2.14 02/02/2017   HGBA1C 5.5 12/09/2017    Dg Bone Density  Result Date: 11/19/2016 EXAM: DUAL X-RAY ABSORPTIOMETRY (DXA) FOR BONE MINERAL DENSITY IMPRESSION: Dear Dr. Dale Caledonia, Your patient Wendy Nichols completed a FRAX assessment on 11/19/2016 using the Lunar iDXA DXA System (analysis version: 14.10) manufactured by [employer]. The following summarizes the results of our evaluation. PATIENT BIOGRAPHICAL: Name: Wendy Nichols, Wendy Nichols Patient ID: [id number] Birth Date: 03/14/1951 Height:    64.4 in. Gender:     Female    [age]       Weight:    141.6 lbs. Ethnicity:  White                            Exam Date: 11/19/2016 FRAX* RESULTS:  (version: 3.5) 10-year Probability of Fracture1 Major Osteoporotic Fracture2 Hip Fracture 19.1% 3.6% Population: [place] (Caucasian) Risk Factors: History of Fracture (Adult) Based on Femur (Right) Neck BMD 1 -The 10-year probability of fracture may be lower than reported if the patient has received treatment. 2 -Major Osteoporotic Fracture: Clinical Spine, Forearm, Hip or Shoulder *FRAX is a Armed forces logistics/support/administrative officer of the [employer] for Metabolic Bone Disease, a World Science writer (WHO) [employer]. ASSESSMENT: The probability of a major osteoporotic fracture is 19.1%  within the next ten years. The probability of a hip fracture is 3.6% within the next ten years. . Dear Dr. Dale La Grange, Your

## 2017-12-08 NOTE — Telephone Encounter (Signed)
Pt coming in for labs tomorrow, please place future orders. Thank you.  

## 2017-12-08 NOTE — Assessment & Plan Note (Addendum)
Physical today 12/08/17.  PAP 08/02/15 - negative with negative HPV.  Mammogram 07/30/17 - negative.  Due colonoscopy.

## 2017-12-08 NOTE — Telephone Encounter (Signed)
Orders placed for labs

## 2017-12-09 ENCOUNTER — Other Ambulatory Visit (INDEPENDENT_AMBULATORY_CARE_PROVIDER_SITE_OTHER): Payer: BLUE CROSS/BLUE SHIELD

## 2017-12-09 ENCOUNTER — Other Ambulatory Visit: Payer: Self-pay | Admitting: Internal Medicine

## 2017-12-09 ENCOUNTER — Telehealth: Payer: Self-pay | Admitting: Radiology

## 2017-12-09 DIAGNOSIS — R739 Hyperglycemia, unspecified: Secondary | ICD-10-CM | POA: Diagnosis not present

## 2017-12-09 DIAGNOSIS — Z8639 Personal history of other endocrine, nutritional and metabolic disease: Secondary | ICD-10-CM | POA: Diagnosis not present

## 2017-12-09 DIAGNOSIS — I10 Essential (primary) hypertension: Secondary | ICD-10-CM | POA: Diagnosis not present

## 2017-12-09 DIAGNOSIS — E7800 Pure hypercholesterolemia, unspecified: Secondary | ICD-10-CM

## 2017-12-09 LAB — HEPATIC FUNCTION PANEL
ALBUMIN: 4.2 g/dL (ref 3.5–5.2)
ALT: 13 U/L (ref 0–35)
AST: 16 U/L (ref 0–37)
Alkaline Phosphatase: 36 U/L — ABNORMAL LOW (ref 39–117)
Bilirubin, Direct: 0.1 mg/dL (ref 0.0–0.3)
TOTAL PROTEIN: 6.9 g/dL (ref 6.0–8.3)
Total Bilirubin: 0.7 mg/dL (ref 0.2–1.2)

## 2017-12-09 LAB — CBC WITH DIFFERENTIAL/PLATELET
BASOS ABS: 0 10*3/uL (ref 0.0–0.1)
Basophils Relative: 0.4 % (ref 0.0–3.0)
Eosinophils Absolute: 0.1 10*3/uL (ref 0.0–0.7)
Eosinophils Relative: 1.2 % (ref 0.0–5.0)
HEMATOCRIT: 36.1 % (ref 36.0–46.0)
HEMOGLOBIN: 12.3 g/dL (ref 12.0–15.0)
LYMPHS PCT: 15.1 % (ref 12.0–46.0)
Lymphs Abs: 0.9 10*3/uL (ref 0.7–4.0)
MCHC: 34.1 g/dL (ref 30.0–36.0)
MCV: 90.5 fl (ref 78.0–100.0)
MONOS PCT: 9 % (ref 3.0–12.0)
Monocytes Absolute: 0.5 10*3/uL (ref 0.1–1.0)
Neutro Abs: 4.5 10*3/uL (ref 1.4–7.7)
Neutrophils Relative %: 74.3 % (ref 43.0–77.0)
Platelets: 252 10*3/uL (ref 150.0–400.0)
RBC: 3.99 Mil/uL (ref 3.87–5.11)
RDW: 13.3 % (ref 11.5–15.5)
WBC: 6.1 10*3/uL (ref 4.0–10.5)

## 2017-12-09 LAB — LIPID PANEL
CHOL/HDL RATIO: 2
Cholesterol: 170 mg/dL (ref 0–200)
HDL: 72.6 mg/dL (ref >39.00)
LDL Cholesterol: 86 mg/dL (ref 0–99)
NONHDL: 97.75
TRIGLYCERIDES: 58 mg/dL (ref 0.0–149.0)
VLDL: 11.6 mg/dL (ref 0.0–40.0)

## 2017-12-09 LAB — URINALYSIS, ROUTINE W REFLEX MICROSCOPIC
BILIRUBIN URINE: NEGATIVE
Hgb urine dipstick: NEGATIVE
Ketones, ur: NEGATIVE
LEUKOCYTES UA: NEGATIVE
Nitrite: NEGATIVE
RBC / HPF: NONE SEEN (ref 0–?)
SPECIFIC GRAVITY, URINE: 1.015 (ref 1.000–1.030)
TOTAL PROTEIN, URINE-UPE24: NEGATIVE
UROBILINOGEN UA: 0.2 (ref 0.0–1.0)
Urine Glucose: NEGATIVE
pH: 6.5 (ref 5.0–8.0)

## 2017-12-09 LAB — BASIC METABOLIC PANEL
BUN: 12 mg/dL (ref 6–23)
CO2: 24 mEq/L (ref 19–32)
Calcium: 9.3 mg/dL (ref 8.4–10.5)
Chloride: 105 mEq/L (ref 96–112)
Creatinine, Ser: 0.94 mg/dL (ref 0.40–1.20)
GFR: 63.23 mL/min (ref >60.00)
GLUCOSE: 101 mg/dL — AB (ref 70–99)
POTASSIUM: 4.5 meq/L (ref 3.5–5.1)
SODIUM: 137 meq/L (ref 135–145)

## 2017-12-09 LAB — HEMOGLOBIN A1C: HEMOGLOBIN A1C: 5.5 % (ref 4.6–6.5)

## 2017-12-09 LAB — VITAMIN B12: VITAMIN B 12: 286 pg/mL (ref 211–911)

## 2017-12-09 NOTE — Telephone Encounter (Signed)
Order placed for urinalysis 

## 2017-12-09 NOTE — Progress Notes (Signed)
Order placed for urinalysis 

## 2017-12-09 NOTE — Addendum Note (Signed)
Addended by: Warden FillersWRIGHT, Tyreshia Ingman S on: 12/09/2017 10:07 AM   Modules accepted: Orders

## 2017-12-09 NOTE — Telephone Encounter (Signed)
Pt came in for labs today. Pt asked about having her urine tested as part of her physical. Advised pt that I would ask provider if urine is needed. Pt also stated she wanted to make sure the labs she had drawn today were part of her physical and would be free for her as her annual physical.

## 2017-12-10 ENCOUNTER — Telehealth: Payer: Self-pay

## 2017-12-10 ENCOUNTER — Telehealth: Payer: Self-pay | Admitting: Internal Medicine

## 2017-12-10 NOTE — Telephone Encounter (Signed)
See result note.  

## 2017-12-10 NOTE — Telephone Encounter (Signed)
Left message on VM- patient advised to call office to schedule B12 injections

## 2017-12-10 NOTE — Telephone Encounter (Signed)
Copied from CRM [phone]. Topic: General - Other >> Dec 10, 2017 11:39 AM Oneal GroutSebastian, Jennifer S wrote: Reason for CRM: Requesting lab results from yesterday

## 2017-12-10 NOTE — Telephone Encounter (Signed)
Patient returning a call for lab results. States that is she does not answer, you can leave a voicemail with the results. Please advise.   Copied from CRM [id number]. Topic: Quick Communication - Lab Results >> Dec 10, 2017  1:41 PM Larry Sierrasavis, Trisha L, LPN wrote: Called patient to inform them of lab results. When patient returns call, triage nurse may disclose results.

## 2017-12-13 ENCOUNTER — Encounter: Payer: Self-pay | Admitting: Internal Medicine

## 2017-12-13 NOTE — Assessment & Plan Note (Signed)
On simvastatin.  Low cholesterol diet and exercise.  Follow lipid panel and liver function tests.   

## 2017-12-13 NOTE — Assessment & Plan Note (Signed)
Increased stress as outlined.  Discussed with her today.  Overall she feels she is handling things relatively well.  Does not feel needs any further intervention.  Follow.   

## 2017-12-13 NOTE — Assessment & Plan Note (Signed)
Has wanted to hold on prescription medication.  Weight bearing exercise.  Calcium and vitamin D.

## 2017-12-13 NOTE — Assessment & Plan Note (Signed)
On thyroid replacement.  Follow tsh.  

## 2017-12-13 NOTE — Assessment & Plan Note (Signed)
Blood pressure under good control.  Continue same medication regimen.  Follow pressures.  Follow metabolic panel.   

## 2017-12-13 NOTE — Assessment & Plan Note (Signed)
Follow vitamin D level.  

## 2017-12-13 NOTE — Assessment & Plan Note (Signed)
Continue B12. 

## 2017-12-15 NOTE — Telephone Encounter (Signed)
Call to patient- left message with the plan on lab note

## 2017-12-15 NOTE — Telephone Encounter (Signed)
Patient returned call and scheduled with the nurse for 12/24/2017. Patient would like a follow up call regarding how often she will have to come in for her b12, please advise

## 2017-12-24 ENCOUNTER — Encounter

## 2017-12-24 ENCOUNTER — Ambulatory Visit: Payer: BLUE CROSS/BLUE SHIELD

## 2017-12-25 ENCOUNTER — Ambulatory Visit (INDEPENDENT_AMBULATORY_CARE_PROVIDER_SITE_OTHER): Payer: BLUE CROSS/BLUE SHIELD | Admitting: *Deleted

## 2017-12-25 DIAGNOSIS — E538 Deficiency of other specified B group vitamins: Secondary | ICD-10-CM | POA: Diagnosis not present

## 2017-12-25 MED ORDER — CYANOCOBALAMIN 1000 MCG/ML IJ SOLN: 1000.0000 ug | Freq: Once | INTRAMUSCULAR | Status: DC

## 2017-12-25 NOTE — Progress Notes (Signed)
Patient presented for B 12 injection to left deltoid, patient voiced no concerns nor showed any signs of distress during injection. 

## 2017-12-31 ENCOUNTER — Ambulatory Visit: Payer: BLUE CROSS/BLUE SHIELD | Admitting: *Deleted

## 2018-01-07 ENCOUNTER — Ambulatory Visit (INDEPENDENT_AMBULATORY_CARE_PROVIDER_SITE_OTHER): Payer: BLUE CROSS/BLUE SHIELD

## 2018-01-07 DIAGNOSIS — E538 Deficiency of other specified B group vitamins: Secondary | ICD-10-CM

## 2018-01-07 DIAGNOSIS — Z23 Encounter for immunization: Secondary | ICD-10-CM

## 2018-01-07 MED ORDER — CYANOCOBALAMIN 1000 MCG/ML IJ SOLN
1000.0000 ug | Freq: Once | INTRAMUSCULAR | Status: AC
  Administered 2018-01-07: 1000 ug via INTRAMUSCULAR

## 2018-01-07 NOTE — Progress Notes (Addendum)
Patient comes in for B 12 injection and flu injection.  B12 administered into right  deltoid. Flu shot given in left deltoid.  Patient tolerated injection well.   Reviewed.  Dr Lorin Picket

## 2018-01-14 ENCOUNTER — Ambulatory Visit (INDEPENDENT_AMBULATORY_CARE_PROVIDER_SITE_OTHER): Payer: BLUE CROSS/BLUE SHIELD

## 2018-01-14 DIAGNOSIS — Z8639 Personal history of other endocrine, nutritional and metabolic disease: Secondary | ICD-10-CM

## 2018-01-14 MED ORDER — CYANOCOBALAMIN 1000 MCG/ML IJ SOLN
1000.0000 ug | Freq: Once | INTRAMUSCULAR | Status: AC
  Administered 2018-01-14: 1000 ug via INTRAMUSCULAR

## 2018-01-14 NOTE — Progress Notes (Signed)
Pt was given B-12 shot in right deltoid and seem to tolerate well.

## 2018-01-15 ENCOUNTER — Telehealth: Payer: Self-pay

## 2018-01-15 NOTE — Telephone Encounter (Signed)
Called and rescheduled pt.

## 2018-01-15 NOTE — Telephone Encounter (Signed)
Copied from CRM [id number]. Topic: Appointment Scheduling - Scheduling Inquiry for Clinic >> Jan 14, 2018  5:00 PM Mickel Baas B, [place] wrote: Reason for CRM: Patient calling and wanted to reschedule her 03/26/18 follow up appointment with Dr Lorin Picket. Looking at schedule she does not have anything available until after 04/2018. Patient would like to know if there is any way to get fit in either before the week of 03/22/18-03/26/18 CB#: [phone]

## 2018-01-25 DIAGNOSIS — Z86010 Personal history of colonic polyps: Secondary | ICD-10-CM | POA: Diagnosis not present

## 2018-02-12 ENCOUNTER — Other Ambulatory Visit: Payer: Self-pay | Admitting: Internal Medicine

## 2018-02-17 ENCOUNTER — Other Ambulatory Visit: Payer: Self-pay | Admitting: Internal Medicine

## 2018-02-17 MED ORDER — "SYRINGE/NEEDLE (DISP) 25G X 1"" 3 ML MISC": 0 refills | Status: DC

## 2018-02-17 NOTE — Telephone Encounter (Signed)
Copied from CRM [id number]. Topic: Quick Communication - See Telephone Encounter >> Feb 17, 2018  8:39 AM Jolayne Haines L wrote: CRM for notification. See Telephone encounter for: 02/17/18.  Patient needs new script for SYRINGE-NEEDLE, DISP, 3 ML 25G X 1" 3 ML MISC.  [id number] [place], [place] - [address] [place] [address]

## 2018-03-05 DIAGNOSIS — K640 First degree hemorrhoids: Secondary | ICD-10-CM | POA: Diagnosis not present

## 2018-03-05 DIAGNOSIS — Z1211 Encounter for screening for malignant neoplasm of colon: Secondary | ICD-10-CM | POA: Diagnosis not present

## 2018-03-05 DIAGNOSIS — Z86010 Personal history of colonic polyps: Secondary | ICD-10-CM | POA: Diagnosis not present

## 2018-03-05 DIAGNOSIS — K5730 Diverticulosis of large intestine without perforation or abscess without bleeding: Secondary | ICD-10-CM | POA: Diagnosis not present

## 2018-03-05 LAB — HM COLONOSCOPY

## 2018-03-15 ENCOUNTER — Other Ambulatory Visit: Payer: Self-pay | Admitting: Internal Medicine

## 2018-03-15 ENCOUNTER — Ambulatory Visit: Payer: BLUE CROSS/BLUE SHIELD | Admitting: Internal Medicine

## 2018-03-15 ENCOUNTER — Encounter: Payer: Self-pay | Admitting: Internal Medicine

## 2018-03-15 DIAGNOSIS — Z86010 Personal history of colonic polyps: Secondary | ICD-10-CM | POA: Diagnosis not present

## 2018-03-15 DIAGNOSIS — Z8639 Personal history of other endocrine, nutritional and metabolic disease: Secondary | ICD-10-CM | POA: Diagnosis not present

## 2018-03-15 DIAGNOSIS — F439 Reaction to severe stress, unspecified: Secondary | ICD-10-CM

## 2018-03-15 DIAGNOSIS — I10 Essential (primary) hypertension: Secondary | ICD-10-CM

## 2018-03-15 DIAGNOSIS — E7800 Pure hypercholesterolemia, unspecified: Secondary | ICD-10-CM

## 2018-03-15 DIAGNOSIS — E039 Hypothyroidism, unspecified: Secondary | ICD-10-CM

## 2018-03-15 MED ORDER — CYANOCOBALAMIN 1000 MCG/ML IJ SOLN: INTRAMUSCULAR | 0 refills | Status: DC

## 2018-03-15 NOTE — Progress Notes (Signed)
[facility]. The following summarizes the results of our evaluation. PATIENT BIOGRAPHICAL: Name: Wendy Nichols, Quinnlyn Nichols Patient ID: [id number] Birth Date: 18-Mar-1951 Height:    64.4 in. Gender:     Female    Age:        67       Weight:    141.6 lbs. Ethnicity:  White                            Exam Date: 11/19/2016 FRAX* RESULTS:  (version: 3.5) 10-year Probability of Fracture1 Major Osteoporotic Fracture2 Hip Fracture 19.1% 3.6% Population: [place] (Cauc[place]) Risk Factors: History of Fracture (Adult) Based on Femur (Right) Neck BMD 1 -The 10-year probability of fracture may be lower than reported if the patient has received treatment. 2 -Major Osteoporotic Fracture: Clinical Spine, Forearm, Hip or Shoulder *FRAX is a Armed forces logistics/support/administrative officertrademark of the [employer], a World Science writerHealth Organization (WHO) [employer]. ASSESSMENT: The probability of a major osteoporotic fracture is 19.1% within the next ten years. The probability of a hip fracture is 3.6% within the next ten years. . Dear Dr. Dale Durhamharlene Mckynzie Liwanag, Your patient Jacquelynn CreeSharron Parsell completed a BMD test on 11/19/2016 using the Lunar iDXA DXA System (analysis version: 14.10) manufactured by [facility]. The following summarizes the results of our evaluation. PATIENT BIOGRAPHICAL: Name: Wendy Nichols, Wendy Nichols Patient ID: [id number] Birth Date: 18-Mar-1951 Height: 64.4 in. Gender: Female Exam Date: 11/19/2016 Weight: 141.6 lbs. Indications: Caucasian, Hysterectomy,  Postmenopausal, History of Fracture (Adult) Fractures: Right forearm Treatments: ASSESSMENT: The BMD measured at Femur Neck Right is 0.729 g/cm2 with a T-score of -2.2. This patient is considered osteopenic according to World Health Organization [facility](WHO) criteria. Site Region Measured Measured WHO Young Adult BMD Date       Age      Classification T-score AP Spine L1-L4 11/19/2016 67 Osteopenia -1.1 1.064 g/cm2 DualFemur Neck Right 11/19/2016 67 Osteopenia -2.2 0.729 g/cm2 World Health Organization [facility](WHO) criteria for post-menopausal, Caucasian Women: Normal:       T-score at or above -1 SD Osteopenia:   T-score between -1 and -2.5 SD Osteoporosis: T-score at or below -2.5 SD RECOMMENDATIONS: National Osteoporosis Foundation recommends that FDA-approved medical therapies be considered in postmenopausal women and men age 66 or older with a: 1. Hip or vertebral (clinical or morphometric) fracture. 2. T-score of < -2.5 at the spine or hip. 3. Ten-year fracture probability by FRAX of 3% or greater for hip fracture or 20% or greater for major osteoporotic fracture. All treatment decisions require clinical judgment and consideration of individual patient factors, including patient preferences, co-morbidities, previous drug use, risk factors not captured in the FRAX model (e.g. falls, vitamin D deficiency, increased bone turnover, interval significant decline in bone density) and possible under - or over-estimation of fracture risk by FRAX. All patients should ensure an adequate intake of dietary calcium (1200 mg/d) and vitamin D (800 IU daily) unless contraindicated. FOLLOW-UP: People with diagnosed cases of osteoporosis or at high risk for fracture should have regular bone mineral density tests. For patients eligible for Medicare, routine testing is allowed once every 2 years. The testing frequency can be increased to one year for patients who have rapidly progressing disease, those who are receiving or discontinuing  medical therapy to restore bone mass, or have additional risk factors. I have reviewed this report, and agree with the above findings. [facility] Electronically Signed   By: Roque LiasJames  Green  Patient ID: Wendy Nichols, female   DOB: Jun 18, 1950, 67 y.o.   MRN: [id number]   Subjective:    Patient ID: Wendy Nichols, female    DOB: 1950-12-18, 67 y.o.   MRN: [id number]  HPI  Patient here for a scheduled follow up.  She reports she is doing relatively well.  Stays active.  No chest pain.  No sob. No acid reflux.  No abdominal pain.  Bowels moving.  No urine change.  Stress is some better.  She feels she is handling things relatively well.  Has only used xanax on a few occasions since her last visit.  Does not feel needs any further intervention.     Past Medical History:  Diagnosis Date  . GERD (gastroesophageal reflux disease)   . History of colon polyps   . Hypercholesterolemia   . Hypertension   . Hypothyroidism   . Osteoporosis   . Pernicious anemia    Past Surgical History:  Procedure Laterality Date  . ABDOMINAL HYSTERECTOMY  1997   abnormal uterine bleeding.   Marland Kitchen BREAST BIOPSY     biopsies in both breasts  . DILATION AND CURETTAGE OF UTERUS  1979   Family History  Problem Relation Age of Onset  . Breast cancer Mother   . Kidney disease Mother   . Hypertension Mother   . Heart disease Mother   . Heart disease Father        myocardial infarction  . Breast cancer Maternal Aunt   . Breast cancer Maternal Grandmother   . Heart disease Brother        myocardial infarction  . Brain cancer Brother    Social History   Socioeconomic History  . Marital status: Married    Spouse name: Not on file  . Number of children: 3  . Years of education: Not on file  . Highest education level: Not on file  Occupational History    Employer: [employer]  Social Needs  . Financial resource strain: Not on file  . Food insecurity:    Worry: Not on file    Inability: Not on file  . Transportation needs:    Medical: Not on file    Non-medical: Not on file  Tobacco Use  . Smoking status: Never Smoker  . Smokeless tobacco: Never Used  Substance and Sexual Activity  .  Alcohol use: Yes    Alcohol/week: 0.0 standard drinks    Comment: occasional wine  . Drug use: No  . Sexual activity: Not on file  Lifestyle  . Physical activity:    Days per week: Not on file    Minutes per session: Not on file  . Stress: Not on file  Relationships  . Social connections:    Talks on phone: Not on file    Gets together: Not on file    Attends religious service: Not on file    Active member of club or organization: Not on file    Attends meetings of clubs or organizations: Not on file    Relationship status: Not on file  Other Topics Concern  . Not on file  Social History Narrative  . Not on file    Outpatient Encounter Medications as of 03/15/2018  Medication Sig  . ALPRAZolam (XANAX) 0.25 MG tablet Take 1 tablet (0.25 mg total) by mouth daily as needed for anxiety.  Marland Kitchen aspirin 81 MG tablet Take 81 mg by mouth every other day.  . cyanocobalamin (,VITAMIN B-12,) 1000 MCG/ML injection Inject  Patient ID: Wendy Nichols, female   DOB: Jun 18, 1950, 67 y.o.   MRN: [id number]   Subjective:    Patient ID: Wendy Nichols, female    DOB: 1950-12-18, 67 y.o.   MRN: [id number]  HPI  Patient here for a scheduled follow up.  She reports she is doing relatively well.  Stays active.  No chest pain.  No sob. No acid reflux.  No abdominal pain.  Bowels moving.  No urine change.  Stress is some better.  She feels she is handling things relatively well.  Has only used xanax on a few occasions since her last visit.  Does not feel needs any further intervention.     Past Medical History:  Diagnosis Date  . GERD (gastroesophageal reflux disease)   . History of colon polyps   . Hypercholesterolemia   . Hypertension   . Hypothyroidism   . Osteoporosis   . Pernicious anemia    Past Surgical History:  Procedure Laterality Date  . ABDOMINAL HYSTERECTOMY  1997   abnormal uterine bleeding.   Marland Kitchen BREAST BIOPSY     biopsies in both breasts  . DILATION AND CURETTAGE OF UTERUS  1979   Family History  Problem Relation Age of Onset  . Breast cancer Mother   . Kidney disease Mother   . Hypertension Mother   . Heart disease Mother   . Heart disease Father        myocardial infarction  . Breast cancer Maternal Aunt   . Breast cancer Maternal Grandmother   . Heart disease Brother        myocardial infarction  . Brain cancer Brother    Social History   Socioeconomic History  . Marital status: Married    Spouse name: Not on file  . Number of children: 3  . Years of education: Not on file  . Highest education level: Not on file  Occupational History    Employer: [employer]  Social Needs  . Financial resource strain: Not on file  . Food insecurity:    Worry: Not on file    Inability: Not on file  . Transportation needs:    Medical: Not on file    Non-medical: Not on file  Tobacco Use  . Smoking status: Never Smoker  . Smokeless tobacco: Never Used  Substance and Sexual Activity  .  Alcohol use: Yes    Alcohol/week: 0.0 standard drinks    Comment: occasional wine  . Drug use: No  . Sexual activity: Not on file  Lifestyle  . Physical activity:    Days per week: Not on file    Minutes per session: Not on file  . Stress: Not on file  Relationships  . Social connections:    Talks on phone: Not on file    Gets together: Not on file    Attends religious service: Not on file    Active member of club or organization: Not on file    Attends meetings of clubs or organizations: Not on file    Relationship status: Not on file  Other Topics Concern  . Not on file  Social History Narrative  . Not on file    Outpatient Encounter Medications as of 03/15/2018  Medication Sig  . ALPRAZolam (XANAX) 0.25 MG tablet Take 1 tablet (0.25 mg total) by mouth daily as needed for anxiety.  Marland Kitchen aspirin 81 MG tablet Take 81 mg by mouth every other day.  . cyanocobalamin (,VITAMIN B-12,) 1000 MCG/ML injection Inject  [facility]. The following summarizes the results of our evaluation. PATIENT BIOGRAPHICAL: Name: Wendy Nichols, Quinnlyn Nichols Patient ID: [id number] Birth Date: 18-Mar-1951 Height:    64.4 in. Gender:     Female    Age:        67       Weight:    141.6 lbs. Ethnicity:  White                            Exam Date: 11/19/2016 FRAX* RESULTS:  (version: 3.5) 10-year Probability of Fracture1 Major Osteoporotic Fracture2 Hip Fracture 19.1% 3.6% Population: [place] (Caucasian) Risk Factors: History of Fracture (Adult) Based on Femur (Right) Neck BMD 1 -The 10-year probability of fracture may be lower than reported if the patient has received treatment. 2 -Major Osteoporotic Fracture: Clinical Spine, Forearm, Hip or Shoulder *FRAX is a Armed forces logistics/support/administrative officertrademark of the [employer], a World Science writerHealth Organization (WHO) [employer]. ASSESSMENT: The probability of a major osteoporotic fracture is 19.1% within the next ten years. The probability of a hip fracture is 3.6% within the next ten years. . Dear Dr. Dale Durhamharlene Mckynzie Liwanag, Your patient Jacquelynn CreeSharron Parsell completed a BMD test on 11/19/2016 using the Lunar iDXA DXA System (analysis version: 14.10) manufactured by [facility]. The following summarizes the results of our evaluation. PATIENT BIOGRAPHICAL: Name: Wendy Nichols, Wendy Nichols Patient ID: [id number] Birth Date: 18-Mar-1951 Height: 64.4 in. Gender: Female Exam Date: 11/19/2016 Weight: 141.6 lbs. Indications: Caucasian, Hysterectomy,  Postmenopausal, History of Fracture (Adult) Fractures: Right forearm Treatments: ASSESSMENT: The BMD measured at Femur Neck Right is 0.729 g/cm2 with a T-score of -2.2. This patient is considered osteopenic according to World Health Organization [facility](WHO) criteria. Site Region Measured Measured WHO Young Adult BMD Date       Age      Classification T-score AP Spine L1-L4 11/19/2016 67 Osteopenia -1.1 1.064 g/cm2 DualFemur Neck Right 11/19/2016 67 Osteopenia -2.2 0.729 g/cm2 World Health Organization [facility](WHO) criteria for post-menopausal, Caucasian Women: Normal:       T-score at or above -1 SD Osteopenia:   T-score between -1 and -2.5 SD Osteoporosis: T-score at or below -2.5 SD RECOMMENDATIONS: National Osteoporosis Foundation recommends that FDA-approved medical therapies be considered in postmenopausal women and men age 66 or older with a: 1. Hip or vertebral (clinical or morphometric) fracture. 2. T-score of < -2.5 at the spine or hip. 3. Ten-year fracture probability by FRAX of 3% or greater for hip fracture or 20% or greater for major osteoporotic fracture. All treatment decisions require clinical judgment and consideration of individual patient factors, including patient preferences, co-morbidities, previous drug use, risk factors not captured in the FRAX model (e.g. falls, vitamin D deficiency, increased bone turnover, interval significant decline in bone density) and possible under - or over-estimation of fracture risk by FRAX. All patients should ensure an adequate intake of dietary calcium (1200 mg/d) and vitamin D (800 IU daily) unless contraindicated. FOLLOW-UP: People with diagnosed cases of osteoporosis or at high risk for fracture should have regular bone mineral density tests. For patients eligible for Medicare, routine testing is allowed once every 2 years. The testing frequency can be increased to one year for patients who have rapidly progressing disease, those who are receiving or discontinuing  medical therapy to restore bone mass, or have additional risk factors. I have reviewed this report, and agree with the above findings. [facility] Electronically Signed   By: Roque LiasJames  Green

## 2018-03-16 ENCOUNTER — Encounter: Payer: Self-pay | Admitting: Internal Medicine

## 2018-03-16 NOTE — Assessment & Plan Note (Signed)
On thyroid replacement.  Follow tsh.  

## 2018-03-16 NOTE — Assessment & Plan Note (Signed)
Blood pressure under good control.  Continue same medication regimen.  Follow pressures.  Follow metabolic panel.   

## 2018-03-16 NOTE — Assessment & Plan Note (Signed)
On simvastatin.  Low cholesterol diet and exercise.  Follow lipid panel and liver function tests.   

## 2018-03-16 NOTE — Assessment & Plan Note (Signed)
Saw Dr Norma Fredricksonoledo.  Just had colonoscopy one week ago.  Need results.  States everything checked out fine and recommended f/u colonoscopy in 10 years.

## 2018-03-16 NOTE — Assessment & Plan Note (Signed)
Overall doing better.  Has xanax if needed.  Has only used a few since her last visit.  Follow.

## 2018-03-16 NOTE — Assessment & Plan Note (Signed)
Follow vitamin D level.  

## 2018-03-16 NOTE — Assessment & Plan Note (Signed)
Continues B12 injections.  Follow.

## 2018-03-26 ENCOUNTER — Ambulatory Visit: Payer: BLUE CROSS/BLUE SHIELD | Admitting: Internal Medicine

## 2018-04-14 ENCOUNTER — Other Ambulatory Visit: Payer: Self-pay | Admitting: Internal Medicine

## 2018-06-15 ENCOUNTER — Other Ambulatory Visit (INDEPENDENT_AMBULATORY_CARE_PROVIDER_SITE_OTHER): Payer: BLUE CROSS/BLUE SHIELD

## 2018-06-15 DIAGNOSIS — E7800 Pure hypercholesterolemia, unspecified: Secondary | ICD-10-CM | POA: Diagnosis not present

## 2018-06-15 DIAGNOSIS — Z8639 Personal history of other endocrine, nutritional and metabolic disease: Secondary | ICD-10-CM

## 2018-06-15 DIAGNOSIS — I10 Essential (primary) hypertension: Secondary | ICD-10-CM | POA: Diagnosis not present

## 2018-06-15 DIAGNOSIS — R739 Hyperglycemia, unspecified: Secondary | ICD-10-CM | POA: Diagnosis not present

## 2018-06-15 DIAGNOSIS — E039 Hypothyroidism, unspecified: Secondary | ICD-10-CM | POA: Diagnosis not present

## 2018-06-15 LAB — BASIC METABOLIC PANEL
BUN: 20 mg/dL (ref 6–23)
CHLORIDE: 102 meq/L (ref 96–112)
CO2: 24 mEq/L (ref 19–32)
Calcium: 9.5 mg/dL (ref 8.4–10.5)
Creatinine, Ser: 0.81 mg/dL (ref 0.40–1.20)
GFR: 70.53 mL/min (ref >60.00)
GLUCOSE: 91 mg/dL (ref 70–99)
POTASSIUM: 4.7 meq/L (ref 3.5–5.1)
Sodium: 135 mEq/L (ref 135–145)

## 2018-06-15 LAB — HEPATIC FUNCTION PANEL
ALT: 13 U/L (ref 0–35)
AST: 13 U/L (ref 0–37)
Albumin: 4.4 g/dL (ref 3.5–5.2)
Alkaline Phosphatase: 42 U/L (ref 39–117)
BILIRUBIN TOTAL: 0.6 mg/dL (ref 0.2–1.2)
Bilirubin, Direct: 0.1 mg/dL (ref 0.0–0.3)
Total Protein: 7.1 g/dL (ref 6.0–8.3)

## 2018-06-15 LAB — VITAMIN B12: Vitamin B-12: 618 pg/mL (ref 211–911)

## 2018-06-15 LAB — LIPID PANEL
CHOL/HDL RATIO: 3
Cholesterol: 172 mg/dL (ref 0–200)
HDL: 65.2 mg/dL (ref >39.00)
LDL Cholesterol: 93 mg/dL (ref 0–99)
NONHDL: 106.6
Triglycerides: 68 mg/dL (ref 0.0–149.0)
VLDL: 13.6 mg/dL (ref 0.0–40.0)

## 2018-06-15 LAB — HEMOGLOBIN A1C: Hgb A1c MFr Bld: 5.4 % (ref 4.6–6.5)

## 2018-06-15 LAB — TSH: TSH: 2.96 u[IU]/mL (ref 0.35–4.50)

## 2018-06-16 ENCOUNTER — Telehealth: Payer: Self-pay | Admitting: Internal Medicine

## 2018-06-16 NOTE — Telephone Encounter (Signed)
Patient was called to give lab results and she says she's having a hard time getting her syringes for her B12 injections once a month. She says [facility] gives her only 1 injection with each vial and she would like to receive a larger quantity. I advised #50 was sent on 02/17/18, she says she did not receive 50. I advised I will call the pharmacy to find out. I called [facility] and spoke to Alafaya, [facility] who says the insurance will only pay for a monthly supply and since she's receiving the injections monthly, she will only receive 1 syringe. Patient called, left VM of the above and advised to call [facility] with questions.

## 2018-06-16 NOTE — Telephone Encounter (Signed)
Copied from CRM [id number]. Topic: Quick Communication - Lab Results (Clinic Use ONLY) >> Jun 16, 2018  2:53 PM Larry Sierras, LPN wrote: Called patient to inform them of lab results. When patient returns call, triage nurse may disclose results.

## 2018-06-16 NOTE — Telephone Encounter (Signed)
Charted in result notes. 

## 2018-07-16 ENCOUNTER — Ambulatory Visit: Payer: BLUE CROSS/BLUE SHIELD | Admitting: Family Medicine

## 2018-07-16 ENCOUNTER — Encounter: Payer: Self-pay | Admitting: Internal Medicine

## 2018-07-16 ENCOUNTER — Ambulatory Visit: Payer: Self-pay

## 2018-07-16 ENCOUNTER — Ambulatory Visit (INDEPENDENT_AMBULATORY_CARE_PROVIDER_SITE_OTHER): Payer: BLUE CROSS/BLUE SHIELD | Admitting: Internal Medicine

## 2018-07-16 DIAGNOSIS — I10 Essential (primary) hypertension: Secondary | ICD-10-CM

## 2018-07-16 DIAGNOSIS — Z9109 Other allergy status, other than to drugs and biological substances: Secondary | ICD-10-CM

## 2018-07-16 DIAGNOSIS — R51 Headache: Secondary | ICD-10-CM

## 2018-07-16 DIAGNOSIS — R519 Headache, unspecified: Secondary | ICD-10-CM

## 2018-07-16 MED ORDER — LISINOPRIL 20 MG PO TABS: 20.0000 mg | ORAL_TABLET | Freq: Every day | ORAL | 2 refills | Status: DC

## 2018-07-16 NOTE — Telephone Encounter (Signed)
Pt c/o elevated BP and moderate headache across her eyebrows. BP 154/98 rechecked 15 minutes later 160/83. No missed doses of BP meds.  Last check 155/84 took Tylenol an hour ago (1000 am) stated not as intense - no other symptoms= scheduled appt with Dr Birdie Sons at 3:15 pm today.   Reason for Disposition . [1] Systolic BP  >= 160 OR Diastolic >= 100 AND [2] cardiac or neurologic symptoms (e.g., chest pain, difficulty breathing, unsteady gait, blurred vision)  Answer Assessment - Initial Assessment Questions 1. BLOOD PRESSURE: "What is the blood pressure?" "Did you take at least two measurements 5 minutes apart?"     154/98  160/83 2. ONSET: "When did you take your blood pressure?"     1015 then 1030 3. HOW: "How did you obtain the blood pressure?" (e.g., visiting nurse, automatic home BP monitor)     Automatic home BP monitor 4. HISTORY: "Do you have a history of high blood pressure?"     yes 5. MEDICATIONS: "Are you taking any medications for blood pressure?" "Have you missed any doses recently?"     Yes- no 6. OTHER SYMPTOMS: "Do you have any symptoms?" (e.g., headache, chest pain, blurred vision, difficulty breathing, weakness)     Headache-across eyebrows 7. PREGNANCY: "Is there any chance you are pregnant?" "When was your last menstrual period?"     n/a  Protocols used: HIGH BLOOD PRESSURE-A-AH

## 2018-07-16 NOTE — Telephone Encounter (Signed)
Pt scheduled  

## 2018-07-16 NOTE — Telephone Encounter (Signed)
Pt may be able to be seen by Dr. Lorin Picket today.  Bethann Berkshire will call pt.

## 2018-07-17 ENCOUNTER — Telehealth: Payer: Self-pay | Admitting: Internal Medicine

## 2018-07-17 ENCOUNTER — Encounter: Payer: Self-pay | Admitting: Internal Medicine

## 2018-07-17 DIAGNOSIS — R519 Headache, unspecified: Secondary | ICD-10-CM | POA: Insufficient documentation

## 2018-07-17 DIAGNOSIS — R51 Headache: Secondary | ICD-10-CM

## 2018-07-17 NOTE — Assessment & Plan Note (Signed)
Treat blood pressure as outlined.  Has a h/o migraine headaches.  This headache not as severe as her migraine headaches.  Focus - sinus pressure.  Treat sinus/allergies as outlined.  Hold on scanning.  Discussed with her.  Any change or worsening symptoms, will need to be evaluated.

## 2018-07-17 NOTE — Progress Notes (Addendum)
Patient ID: Wendy Nichols, female   DOB: 05-15-1950, 68 y.o.   MRN: [id number] Virtual Visit via Video Note  I connected with Wendy Nichols on 07/16/18 at 12:00 PM EDT by a video enabled telemedicine application and verified that I am speaking with the correct person using two identifiers.  Location patient: home Location provider:work Persons participating in the virtual visit: patient, provider  I discussed the limitations of evaluation and management by telemedicine.  This visit type was conducted due to national recommendations for restrictions regarding the COVID-19 pandemic.  This format is felt to be most appropriate for this patient at this time.   The patient expressed understanding and agreed to proceed.   HPI: This was scheduled as a work in visit.  She reports she had been working out in her yard recently.  Yesterday am noticed headache - some fullness.  She related the headache to sinus/allergies.  Persistent headache.  Took tylenol.  Did help.  Only took one tylenol.  Today, headache not as bad, but persistent.  Took her blood pressure.  Pressures ranging 147-150s/80-90.  One highest blood pressure 160/83.  Is having a runny nose.  Headache - frontal/sinus.  Denies all over headache.  No sore throat.  No fever.  No chest congestion or cough.  No chest pain or sob.  No dizziness or light headedness.  No nausea or vomiting.  Taking metoprolol 1/2 tablet per day.  Also on lisinopril 10mg  q day.  Taking regularly.     ROS: See pertinent positives and negatives per HPI.  Past Medical History:  Diagnosis Date  . GERD (gastroesophageal reflux disease)   . History of colon polyps   . Hypercholesterolemia   . Hypertension   . Hypothyroidism   . Osteoporosis   . Pernicious anemia     Past Surgical History:  Procedure Laterality Date  . ABDOMINAL HYSTERECTOMY  1997   abnormal uterine bleeding.   Marland Kitchen BREAST BIOPSY     biopsies in both breasts  . DILATION AND CURETTAGE OF UTERUS   1979    Family History  Problem Relation Age of Onset  . Breast cancer Mother   . Kidney disease Mother   . Hypertension Mother   . Heart disease Mother   . Heart disease Father        myocardial infarction  . Breast cancer Maternal Aunt   . Breast cancer Maternal Grandmother   . Heart disease Brother        myocardial infarction  . Brain cancer Brother     SOCIAL HX: reviewed.     Current Outpatient Medications:  .  ALPRAZolam (XANAX) 0.25 MG tablet, Take 1 tablet (0.25 mg total) by mouth daily as needed for anxiety., Disp: 30 tablet, Rfl: 0 .  aspirin 81 MG tablet, Take 81 mg by mouth every other day., Disp: , Rfl:  .  cyanocobalamin (,VITAMIN B-12,) 1000 MCG/ML injection, Inject 1 ml (1000 mcg total) into muscle every thirty days., Disp: 10 mL, Rfl: 0 .  fluticasone (FLONASE) 50 MCG/ACT nasal spray, Place 2 sprays into both nostrils daily., Disp: 16 g, Rfl: 3 .  levothyroxine (SYNTHROID, LEVOTHROID) 100 MCG tablet, TAKE 1 TABLET BY MOUTH DAILY., Disp: 30 tablet, Rfl: 11 .  metoprolol tartrate (LOPRESSOR) 25 MG tablet, TAKE 1/2 TABLET BY MOUTH DAILY., Disp: 15 tablet, Rfl: 5 .  simvastatin (ZOCOR) 10 MG tablet, TAKE 1 TABLET (10 MG TOTAL) BY MOUTH EVERY EVENING., Disp: 30 tablet, Rfl: 5 .  SYRINGE-NEEDLE, DISP,  3 ML 25G X 1" 3 ML MISC, Dose and route does not apply.  To be used to inject 1076mcg/mL Cyanocobalamin IM once every 30 days, Disp: 50 each, Rfl: 0 .  lisinopril (PRINIVIL,ZESTRIL) 20 MG tablet, Take 1 tablet (20 mg total) by mouth daily., Disp: 30 tablet, Rfl: 2  Current Facility-Administered Medications:  .  cyanocobalamin ((VITAMIN B-12)) injection 1,000 mcg, 1,000 mcg, Intramuscular, Once, Dale Campo Rico, MD  EXAM:  VITALS per patient if applicable: blood pressures per pt as outlined.  Last blood pressure 156/82  GENERAL: alert, oriented, appears well and in no acute distress  HEENT: atraumatic, conjunttiva clear, no obvious abnormalities on inspection of  external nose  NECK: normal movements of the head and neck  LUNGS: on inspection no signs of respiratory distress, breathing rate appears normal, no obvious gross SOB, gasping or wheezing  CV: no obvious cyanosis  PSYCH/NEURO: pleasant and cooperative, no obvious depression or anxiety, speech and thought processing grossly intact  ASSESSMENT AND PLAN:  Discussed the following assessment and plan:  Environmental allergies  Essential hypertension  Nonintractable headache, unspecified chronicity pattern, unspecified headache type     I discussed the assessment and treatment plan with the patient. The patient was provided an opportunity to ask questions and all were answered. The patient agreed with the plan and demonstrated an understanding of the instructions.   The patient was advised to call back or seek an in-person evaluation if the symptoms worsen or if the condition fails to improve as anticipated.   Dale Clarence, MD

## 2018-07-17 NOTE — Assessment & Plan Note (Signed)
With increased sinus pressure/headache and runny nose. Treat with nasacort nasal spray as directed.  Tylenol as directed.  Treat blood pressure.  Follow. Call with update.

## 2018-07-17 NOTE — Telephone Encounter (Signed)
Opened in error

## 2018-07-17 NOTE — Assessment & Plan Note (Signed)
Blood pressure elevated as outlined.  Increase lisinopril to 20mg  q day.  Follow pressures.  Schedule f/u appt in 3 weeks.  Have her send in readings over the next week.  Any worsening change or problems, will need to be evaluated.

## 2018-07-19 ENCOUNTER — Ambulatory Visit: Payer: BLUE CROSS/BLUE SHIELD | Admitting: Internal Medicine

## 2018-07-20 ENCOUNTER — Telehealth: Payer: Self-pay | Admitting: Internal Medicine

## 2018-07-20 NOTE — Telephone Encounter (Signed)
Left message to get readings from her but wanted you to see this. You had sent me a staff msg on this pt

## 2018-07-20 NOTE — Telephone Encounter (Signed)
Pt called to report BP readings  4.4.20  at 8:30am 126 /74 pulse 54 At 6:45pm 122/66 pulse 64  4.5.20  at 9am 111/62 pulse 59 At 8:30 pm 126/70 pulse 78  4.6.20  at 8:30am 128/68 pulse 58 At 9:30pm 108/62 pulse 68  4.7.20 at 9:30am  135/74 pulse 61

## 2018-07-20 NOTE — Telephone Encounter (Signed)
Patient did not have any BP readings on hand but will call back to give them over the phone when she gets home. She is taking Lisinopril 10 mg q day.

## 2018-07-20 NOTE — Telephone Encounter (Signed)
Pt wanted Dr Lorin Picket to know that she is doing good with the bp medication. Pt did not have to stay on the double dose bp drop well.  Please call pt back @ [phone]. Thank you!

## 2018-07-20 NOTE — Telephone Encounter (Signed)
Please call her and let her know that I am glad she is feeling better.  Please document how her blood pressures are doing.  Also confirm, if I understand the note correctly, she is not taking the higher dose of the lisinopril.

## 2018-07-21 NOTE — Telephone Encounter (Signed)
These are her BP readings

## 2018-07-21 NOTE — Telephone Encounter (Signed)
Blood pressures look good.  Please confirm dose of blood pressure medication she is taking and document.

## 2018-07-21 NOTE — Telephone Encounter (Signed)
She is taking Lisinopril 10 mg q day

## 2018-08-08 ENCOUNTER — Other Ambulatory Visit: Payer: Self-pay | Admitting: Internal Medicine

## 2018-08-09 ENCOUNTER — Ambulatory Visit: Payer: BLUE CROSS/BLUE SHIELD | Admitting: Internal Medicine

## 2018-08-12 ENCOUNTER — Ambulatory Visit: Payer: BLUE CROSS/BLUE SHIELD | Admitting: Internal Medicine

## 2018-08-17 ENCOUNTER — Encounter: Payer: Self-pay | Admitting: Internal Medicine

## 2018-08-17 ENCOUNTER — Other Ambulatory Visit: Payer: Self-pay

## 2018-08-17 ENCOUNTER — Ambulatory Visit (INDEPENDENT_AMBULATORY_CARE_PROVIDER_SITE_OTHER): Payer: BLUE CROSS/BLUE SHIELD | Admitting: Internal Medicine

## 2018-08-17 DIAGNOSIS — E7800 Pure hypercholesterolemia, unspecified: Secondary | ICD-10-CM

## 2018-08-17 DIAGNOSIS — Z8639 Personal history of other endocrine, nutritional and metabolic disease: Secondary | ICD-10-CM

## 2018-08-17 DIAGNOSIS — E039 Hypothyroidism, unspecified: Secondary | ICD-10-CM

## 2018-08-17 DIAGNOSIS — Z9109 Other allergy status, other than to drugs and biological substances: Secondary | ICD-10-CM | POA: Diagnosis not present

## 2018-08-17 DIAGNOSIS — Z1239 Encounter for other screening for malignant neoplasm of breast: Secondary | ICD-10-CM | POA: Diagnosis not present

## 2018-08-17 DIAGNOSIS — I10 Essential (primary) hypertension: Secondary | ICD-10-CM

## 2018-08-17 NOTE — Progress Notes (Signed)
Patient ID: Wendy Nichols, female   DOB: 02/09/1951, 68 y.o.   MRN: [id number]   Virtual Visit via Video Note  This visit type was conducted due to national recommendations for restrictions regarding the COVID-19 pandemic (e.g. social distancing).  This format is felt to be most appropriate for this patient at this time.  All issues noted in this document were discussed and addressed.  No physical exam was performed (except for noted visual exam findings with Video Visits).   I connected with Jacquelynn Cree by a video enabled telemedicine application and verified that I am speaking with the correct person using two identifiers. Location patient: home Location provider: work Persons participating in the virtual visit: patient, provider  I discussed the limitations, risks, security and privacy concerns of performing an evaluation and management service by video and the availability of in person appointments. The patient expressed understanding and agreed to proceed.   Reason for visit: scheduled follow up.    HPI: She reports she is doing well.  Feels good.  Recent visit - elevated blood pressure.  She is currently taking lisinopril 10mg  q day.  Did not increase to 20mg , except for the day I talked with her recently. Blood pressures doing well.  Blood pressure averaging 120s/60-70s.  Highest reading 139/81.  She is walking.  Working outside.  No chest pain.  No sob.  No acid reflux.  No abdominal pain.  Bowels moving.     ROS: See pertinent positives and negatives per HPI.  Past Medical History:  Diagnosis Date  . GERD (gastroesophageal reflux disease)   . History of colon polyps   . Hypercholesterolemia   . Hypertension   . Hypothyroidism   . Osteoporosis   . Pernicious anemia     Past Surgical History:  Procedure Laterality Date  . ABDOMINAL HYSTERECTOMY  1997   abnormal uterine bleeding.   Marland Kitchen BREAST BIOPSY     biopsies in both breasts  . DILATION AND CURETTAGE OF UTERUS   1979    Family History  Problem Relation Age of Onset  . Breast cancer Mother   . Kidney disease Mother   . Hypertension Mother   . Heart disease Mother   . Heart disease Father        myocardial infarction  . Breast cancer Maternal Aunt   . Breast cancer Maternal Grandmother   . Heart disease Brother        myocardial infarction  . Brain cancer Brother     SOCIAL HX: reviewed.    Current Outpatient Medications:  .  ALPRAZolam (XANAX) 0.25 MG tablet, Take 1 tablet (0.25 mg total) by mouth daily as needed for anxiety., Disp: 30 tablet, Rfl: 0 .  aspirin 81 MG tablet, Take 81 mg by mouth every other day., Disp: , Rfl:  .  cyanocobalamin (,VITAMIN B-12,) 1000 MCG/ML injection, Inject 1 ml (1000 mcg total) into muscle every thirty days., Disp: 10 mL, Rfl: 0 .  fluticasone (FLONASE) 50 MCG/ACT nasal spray, Place 2 sprays into both nostrils daily., Disp: 16 g, Rfl: 3 .  levothyroxine (SYNTHROID, LEVOTHROID) 100 MCG tablet, TAKE 1 TABLET BY MOUTH DAILY., Disp: 30 tablet, Rfl: 11 .  lisinopril (ZESTRIL) 10 MG tablet, Take 1 tablet by mouth daily., Disp: , Rfl:  .  metoprolol tartrate (LOPRESSOR) 25 MG tablet, TAKE 1/2 TABLET BY MOUTH DAILY., Disp: 15 tablet, Rfl: 5 .  simvastatin (ZOCOR) 10 MG tablet, TAKE 1 TABLET (10 MG TOTAL) BY MOUTH EVERY EVENING., Disp:  30 tablet, Rfl: 5 .  SYRINGE-NEEDLE, DISP, 3 ML 25G X 1" 3 ML MISC, Dose and route does not apply.  To be used to inject 1094mcg/mL Cyanocobalamin IM once every 30 days, Disp: 50 each, Rfl: 0  Current Facility-Administered Medications:  .  cyanocobalamin ((VITAMIN B-12)) injection 1,000 mcg, 1,000 mcg, Intramuscular, Once, Dale Van Horn, MD  EXAM:  GENERAL: alert, oriented, appears well and in no acute distress  HEENT: atraumatic, conjunttiva clear, no obvious abnormalities on inspection of external nose and ears  NECK: normal movements of the head and neck  LUNGS: on inspection no signs of respiratory distress, breathing  rate appears normal, no obvious gross SOB, gasping or wheezing  CV: no obvious cyanosis  PSYCH/NEURO: pleasant and cooperative, no obvious depression or anxiety, speech and thought processing grossly intact  ASSESSMENT AND PLAN:  Discussed the following assessment and plan:  Breast cancer screening - Plan: MM 3D SCREEN BREAST BILATERAL  Environmental allergies  History of non anemic vitamin B12 deficiency  History of vitamin D deficiency  Hypercholesterolemia - Plan: Hemoglobin A1c, Hepatic function panel, Lipid panel  Essential hypertension - Plan: CBC with Differential/Platelet, Basic metabolic panel  Hypothyroidism, unspecified type  Environmental allergies Just runny nose.  Using saline nasal spray.  Does not feel needs anything more at this time.  Follow.    History of non anemic vitamin B12 deficiency Continue B12 injection.    History of vitamin D deficiency Follow vitamin D level.   Hypercholesterolemia On simvastatin.  Low cholesterol diet and exercise.  Follow lipid panel and liver function tests.    Hypertension Blood pressure better.  On 10mg  lisinopril.  Follow.    Hypothyroidism On thyroid replacement.  Follow tsh.     I discussed the assessment and treatment plan with the patient. The patient was provided an opportunity to ask questions and all were answered. The patient agreed with the plan and demonstrated an understanding of the instructions.   The patient was advised to call back or seek an in-person evaluation if the symptoms worsen or if the condition fails to improve as anticipated.    Dale Slaughters, MD

## 2018-08-21 ENCOUNTER — Encounter: Payer: Self-pay | Admitting: Internal Medicine

## 2018-08-21 NOTE — Assessment & Plan Note (Signed)
Just runny nose.  Using saline nasal spray.  Does not feel needs anything more at this time.  Follow.

## 2018-08-21 NOTE — Assessment & Plan Note (Signed)
On thyroid replacement.  Follow tsh.  

## 2018-08-21 NOTE — Assessment & Plan Note (Signed)
Continue B12 injection.   

## 2018-08-21 NOTE — Assessment & Plan Note (Signed)
Follow vitamin D level.  

## 2018-08-21 NOTE — Assessment & Plan Note (Signed)
Blood pressure better.  On 10mg  lisinopril.  Follow.

## 2018-08-21 NOTE — Assessment & Plan Note (Signed)
On simvastatin.  Low cholesterol diet and exercise.  Follow lipid panel and liver function tests.   

## 2018-08-25 ENCOUNTER — Other Ambulatory Visit: Payer: Self-pay | Admitting: Internal Medicine

## 2018-08-30 ENCOUNTER — Other Ambulatory Visit: Payer: Self-pay | Admitting: Internal Medicine

## 2018-09-02 ENCOUNTER — Other Ambulatory Visit: Payer: Self-pay | Admitting: Internal Medicine

## 2018-09-02 NOTE — Telephone Encounter (Signed)
Pt is going out of town, please fill as soon as possible

## 2018-09-20 DIAGNOSIS — Z1231 Encounter for screening mammogram for malignant neoplasm of breast: Secondary | ICD-10-CM | POA: Diagnosis not present

## 2018-09-20 DIAGNOSIS — Z803 Family history of malignant neoplasm of breast: Secondary | ICD-10-CM | POA: Diagnosis not present

## 2018-09-20 LAB — HM MAMMOGRAPHY

## 2018-09-22 ENCOUNTER — Ambulatory Visit: Payer: BLUE CROSS/BLUE SHIELD | Admitting: Internal Medicine

## 2018-09-28 ENCOUNTER — Telehealth: Payer: Self-pay

## 2018-09-28 NOTE — Telephone Encounter (Signed)
Results asbtracted

## 2018-09-28 NOTE — Telephone Encounter (Signed)
Faxed record request.

## 2018-09-28 NOTE — Telephone Encounter (Signed)
Copied from [place] [id number]. Topic: Medical Record Request - Provider/Facility Request >> Sep 27, 2018  4:02 PM Erick Blinks wrote: Patient Name/DOB/MRN #: Wendy Nichols  May 04, 1950  [id number] Requestor Name/Agency: [facility]  Call Back #: [phone] Information Requested: Marcie Bal called to report that pt already had mammogram this month.    Route to [employer] for [employer]. For all other clinics, route to the clinic's PEC Pool.

## 2018-09-28 NOTE — Telephone Encounter (Signed)
Need results.

## 2018-12-08 ENCOUNTER — Telehealth: Payer: Self-pay

## 2018-12-08 NOTE — Telephone Encounter (Signed)
Left detailed message.   

## 2018-12-08 NOTE — Telephone Encounter (Signed)
Copied from [place] [id number]. Topic: General - Other >> Dec 08, 2018  9:58 AM Yvette Rack wrote: Reason for CRM: Pt stated the labs that she is scheduled to have done on 2019-01-08 should be coded to reflect that it goes along with her physical appt scheduled for 12/15/18. Pt stated she wants to make sure it is coded correctly because its a lot of trouble trying to get it corrected afterwards.

## 2018-12-10 ENCOUNTER — Other Ambulatory Visit (INDEPENDENT_AMBULATORY_CARE_PROVIDER_SITE_OTHER): Payer: BC Managed Care – PPO

## 2018-12-10 ENCOUNTER — Other Ambulatory Visit: Payer: Self-pay

## 2018-12-10 DIAGNOSIS — E7800 Pure hypercholesterolemia, unspecified: Secondary | ICD-10-CM | POA: Diagnosis not present

## 2018-12-10 DIAGNOSIS — I10 Essential (primary) hypertension: Secondary | ICD-10-CM

## 2018-12-10 LAB — HEPATIC FUNCTION PANEL
ALT: 10 U/L (ref 0–35)
AST: 14 U/L (ref 0–37)
Albumin: 4.3 g/dL (ref 3.5–5.2)
Alkaline Phosphatase: 42 U/L (ref 39–117)
Bilirubin, Direct: 0.1 mg/dL (ref 0.0–0.3)
Total Bilirubin: 0.5 mg/dL (ref 0.2–1.2)
Total Protein: 6.9 g/dL (ref 6.0–8.3)

## 2018-12-10 LAB — CBC WITH DIFFERENTIAL/PLATELET
Basophils Absolute: 0 10*3/uL (ref 0.0–0.1)
Basophils Relative: 0.5 % (ref 0.0–3.0)
Eosinophils Absolute: 0.1 10*3/uL (ref 0.0–0.7)
Eosinophils Relative: 1.9 % (ref 0.0–5.0)
HCT: 37.3 % (ref 36.0–46.0)
Hemoglobin: 12.8 g/dL (ref 12.0–15.0)
Lymphocytes Relative: 25.1 % (ref 12.0–46.0)
Lymphs Abs: 1.3 10*3/uL (ref 0.7–4.0)
MCHC: 34.3 g/dL (ref 30.0–36.0)
MCV: 90.4 fl (ref 78.0–100.0)
Monocytes Absolute: 0.5 10*3/uL (ref 0.1–1.0)
Monocytes Relative: 9.5 % (ref 3.0–12.0)
Neutro Abs: 3.2 10*3/uL (ref 1.4–7.7)
Neutrophils Relative %: 63 % (ref 43.0–77.0)
Platelets: 257 10*3/uL (ref 150.0–400.0)
RBC: 4.13 Mil/uL (ref 3.87–5.11)
RDW: 12.5 % (ref 11.5–15.5)
WBC: 5 10*3/uL (ref 4.0–10.5)

## 2018-12-10 LAB — BASIC METABOLIC PANEL
BUN: 16 mg/dL (ref 6–23)
CO2: 25 mEq/L (ref 19–32)
Calcium: 9.3 mg/dL (ref 8.4–10.5)
Chloride: 105 mEq/L (ref 96–112)
Creatinine, Ser: 0.93 mg/dL (ref 0.40–1.20)
GFR: 60.05 mL/min (ref >60.00)
Glucose, Bld: 103 mg/dL — ABNORMAL HIGH (ref 70–99)
Potassium: 4.4 mEq/L (ref 3.5–5.1)
Sodium: 137 mEq/L (ref 135–145)

## 2018-12-10 LAB — LIPID PANEL
Cholesterol: 176 mg/dL (ref 0–200)
HDL: 67.2 mg/dL (ref >39.00)
LDL Cholesterol: 94 mg/dL (ref 0–99)
NonHDL: 108.71
Total CHOL/HDL Ratio: 3
Triglycerides: 74 mg/dL (ref 0.0–149.0)
VLDL: 14.8 mg/dL (ref 0.0–40.0)

## 2018-12-10 LAB — HEMOGLOBIN A1C: Hgb A1c MFr Bld: 5.6 % (ref 4.6–6.5)

## 2018-12-15 ENCOUNTER — Other Ambulatory Visit: Payer: Self-pay

## 2018-12-15 ENCOUNTER — Ambulatory Visit (INDEPENDENT_AMBULATORY_CARE_PROVIDER_SITE_OTHER): Payer: BC Managed Care – PPO | Admitting: Internal Medicine

## 2018-12-15 ENCOUNTER — Ambulatory Visit: Payer: BC Managed Care – PPO | Admitting: Internal Medicine

## 2018-12-15 ENCOUNTER — Encounter: Payer: Self-pay | Admitting: Internal Medicine

## 2018-12-15 VITALS — BP 122/62 | HR 63 | Temp 98.2°F | Resp 16 | Ht 64.0 in | Wt 147.2 lb

## 2018-12-15 DIAGNOSIS — E7800 Pure hypercholesterolemia, unspecified: Secondary | ICD-10-CM

## 2018-12-15 DIAGNOSIS — I10 Essential (primary) hypertension: Secondary | ICD-10-CM

## 2018-12-15 DIAGNOSIS — Z0000 Encounter for general adult medical examination without abnormal findings: Secondary | ICD-10-CM

## 2018-12-15 DIAGNOSIS — Z9109 Other allergy status, other than to drugs and biological substances: Secondary | ICD-10-CM

## 2018-12-15 DIAGNOSIS — R739 Hyperglycemia, unspecified: Secondary | ICD-10-CM

## 2018-12-15 DIAGNOSIS — E039 Hypothyroidism, unspecified: Secondary | ICD-10-CM

## 2018-12-15 DIAGNOSIS — M7989 Other specified soft tissue disorders: Secondary | ICD-10-CM | POA: Diagnosis not present

## 2018-12-15 NOTE — Assessment & Plan Note (Addendum)
Physical today 12/15/18.  PAP 07/2015 - negative with negative HPV.  Mammogram 09/21/18 - Birads I.  Colonoscopy 01/25/18 - Dr Alice Reichert.

## 2018-12-15 NOTE — Patient Instructions (Addendum)
nasacort nasal spray - 2 sprays each nostril one time per day.  Do this in the evening.   

## 2018-12-15 NOTE — Progress Notes (Signed)
oriented to person, place, and time.  Psychiatric:        Mood and Affect: Mood normal.        Behavior: Behavior normal.     BP 122/62   Pulse 63   Temp 98.2 F (36.8 C)   Resp 16   Ht 5\' 4"  (1.626 m)   Wt 147 lb 3.2 oz (66.8 kg)   LMP 03/25/1996   SpO2 99%   BMI 25.27 kg/m  Wt Readings from Last 3 Encounters:  12/15/18 147 lb 3.2 oz (66.8 kg)  03/15/18 149 lb 6.4 oz (67.8 kg)  12/08/17 149 lb 12.8 oz (67.9 kg)     Lab Results  Component Value Date   WBC 5.0 12/10/2018   HGB 12.8 12/10/2018   HCT 37.3 12/10/2018   PLT 257.0 12/10/2018   GLUCOSE 103 (H) 12/10/2018   CHOL 176 12/10/2018   TRIG 74.0 12/10/2018   HDL 67.20 12/10/2018   LDLCALC 94 12/10/2018   ALT 10 12/10/2018   AST 14 12/10/2018   NA 137 12/10/2018   K 4.4 12/10/2018   CL 105 12/10/2018   CREATININE 0.93 12/10/2018   BUN 16 12/10/2018   CO2 25 12/10/2018   TSH 2.96 06/15/2018   HGBA1C 5.6 12/10/2018    Dg Bone Density  Result Date: 11/19/2016 EXAM: DUAL X-RAY ABSORPTIOMETRY (DXA) FOR BONE MINERAL DENSITY IMPRESSION: Dear Dr. Dale Durhamharlene Kennett Symes, Your patient Wendy PetitSharron Nichols Nichols completed a FRAX assessment on 11/19/2016 using the Lunar iDXA DXA System (analysis version: 14.10) manufactured by [employer]. The following summarizes the results of our evaluation. PATIENT BIOGRAPHICAL: Name: Wendy PetitFarrell, Wendy Nichols Patient ID: [id number] Birth Date: [birth year] Height:    64.4 in. Gender:     Female    [age]       Weight:    141.6 lbs. Ethnicity:  White                            Exam Date: 11/19/2016 FRAX* RESULTS:  (version: 3.5) 10-year Probability of Fracture1 Major Osteoporotic Fracture2 Hip Fracture  19.1% 3.6% Population: [place] (Caucasian) Risk Factors: History of Fracture (Adult) Based on Femur (Right) Neck BMD 1 -The 10-year probability of fracture may be lower than reported if the patient has received treatment. 2 -Major Osteoporotic Fracture: Clinical Spine, Forearm, Hip or Shoulder *FRAX is a Armed forces logistics/support/administrative officertrademark of the [employer], a World Science writerHealth Organization (WHO) [employer]. ASSESSMENT: The probability of a major osteoporotic fracture is 19.1% within the next ten years. The probability of a hip fracture is 3.6% within the next ten years. . Dear Dr. Dale Durhamharlene Shernell Saldierna, Your patient Wendy Nichols completed a BMD test on 11/19/2016 using the Lunar iDXA DXA System (analysis version: 14.10) manufactured by [employer]. The following summarizes the results of our evaluation. PATIENT BIOGRAPHICAL: Name: Wendy PetitFarrell, Wendy Nichols Patient ID: [id number] Birth Date: [birth year] Height: 64.4 in. Gender: Female Exam Date: 11/19/2016 Weight: 141.6 lbs. Indications: Caucasian, Hysterectomy, Postmenopausal, History of Fracture (Adult) Fractures: Right forearm Treatments: ASSESSMENT: The BMD measured at Femur Neck Right is 0.729 g/cm2 with a T-score of -2.2. This patient is considered osteopenic according to World Health Organization [facility](WHO) criteria. Site Region Measured Measured WHO Young Adult BMD Date       Age      Classification T-score AP Spine L1-L4 11/19/2016 65.4 Osteopenia -1.1 1.064 g/cm2 DualFemur Neck Right 11/19/2016 65.4  Patient ID: Wendy PetitSharron Nichols Nichols, female   DOB: 11/26/1950, 68 y.o.   MRN: [id number]   Subjective:    Patient ID: Wendy PetitSharron Nichols Nichols, female    DOB: 03/20/1951, 68 y.o.   MRN: [id number]  HPI  Patient here for her physical exam.  She reports she is doing relatively well.  Reports her blood pressure has been doing well.  Taking lisinopril. Stays active.  No chest pain.  No sob.  No acid reflux.  No abdominal pain.  Bowels moving.  Discussed lab results.  Continue simvastatin.  Does report left elbow/arm pain.  Some swelling.  No injury.  No trauma.  No increased erythema.  Also report some left ear fullness at times.  No hearing change.  Some congestion.  No significant sinus issues.  No fever, cough or congestion.      Past Medical History:  Diagnosis Date  . GERD (gastroesophageal reflux disease)   . History of colon polyps   . Hypercholesterolemia   . Hypertension   . Hypothyroidism   . Osteoporosis   . Pernicious anemia    Past Surgical History:  Procedure Laterality Date  . ABDOMINAL HYSTERECTOMY  1997   abnormal uterine bleeding.   Marland Kitchen. BREAST BIOPSY     biopsies in both breasts  . DILATION AND CURETTAGE OF UTERUS  1979   Family History  Problem Relation Age of Onset  . Breast cancer Mother   . Kidney disease Mother   . Hypertension Mother   . Heart disease Mother   . Heart disease Father        myocardial infarction  . Breast cancer Maternal Aunt   . Breast cancer Maternal Grandmother   . Heart disease Brother        myocardial infarction  . Brain cancer Brother    Social History   Socioeconomic History  . Marital status: Married    Spouse name: Not on file  . Number of children: 3  . Years of education: Not on file  . Highest education level: Not on file  Occupational History    Employer: [employer]  Social Needs  . Financial resource strain: Not on file  . Food insecurity    Worry: Not on file    Inability: Not on file  . Transportation needs    Medical: Not on  file    Non-medical: Not on file  Tobacco Use  . Smoking status: Never Smoker  . Smokeless tobacco: Never Used  Substance and Sexual Activity  . Alcohol use: Yes    Alcohol/week: 0.0 standard drinks    Comment: occasional wine  . Drug use: No  . Sexual activity: Not on file  Lifestyle  . Physical activity    Days per week: Not on file    Minutes per session: Not on file  . Stress: Not on file  Relationships  . Social Musicianconnections    Talks on phone: Not on file    Gets together: Not on file    Attends religious service: Not on file    Active member of club or organization: Not on file    Attends meetings of clubs or organizations: Not on file    Relationship status: Not on file  Other Topics Concern  . Not on file  Social History Narrative  . Not on file    Outpatient Encounter Medications as of 12/15/2018  Medication Sig  . cholecalciferol (VITAMIN D) 25 MCG (1000 UT) tablet Take 1,000 Units by mouth daily.  .Marland Kitchen  oriented to person, place, and time.  Psychiatric:        Mood and Affect: Mood normal.        Behavior: Behavior normal.     BP 122/62   Pulse 63   Temp 98.2 F (36.8 C)   Resp 16   Ht 5\' 4"  (1.626 m)   Wt 147 lb 3.2 oz (66.8 kg)   LMP 03/25/1996   SpO2 99%   BMI 25.27 kg/m  Wt Readings from Last 3 Encounters:  12/15/18 147 lb 3.2 oz (66.8 kg)  03/15/18 149 lb 6.4 oz (67.8 kg)  12/08/17 149 lb 12.8 oz (67.9 kg)     Lab Results  Component Value Date   WBC 5.0 12/10/2018   HGB 12.8 12/10/2018   HCT 37.3 12/10/2018   PLT 257.0 12/10/2018   GLUCOSE 103 (H) 12/10/2018   CHOL 176 12/10/2018   TRIG 74.0 12/10/2018   HDL 67.20 12/10/2018   LDLCALC 94 12/10/2018   ALT 10 12/10/2018   AST 14 12/10/2018   NA 137 12/10/2018   K 4.4 12/10/2018   CL 105 12/10/2018   CREATININE 0.93 12/10/2018   BUN 16 12/10/2018   CO2 25 12/10/2018   TSH 2.96 06/15/2018   HGBA1C 5.6 12/10/2018    Dg Bone Density  Result Date: 11/19/2016 EXAM: DUAL X-RAY ABSORPTIOMETRY (DXA) FOR BONE MINERAL DENSITY IMPRESSION: Dear Dr. Dale Durhamharlene Kennett Symes, Your patient Wendy PetitSharron Nichols Nichols completed a FRAX assessment on 11/19/2016 using the Lunar iDXA DXA System (analysis version: 14.10) manufactured by [employer]. The following summarizes the results of our evaluation. PATIENT BIOGRAPHICAL: Name: Wendy PetitFarrell, Wendy Nichols Patient ID: [id number] Birth Date: [birth year] Height:    64.4 in. Gender:     Female    [age]       Weight:    141.6 lbs. Ethnicity:  White                            Exam Date: 11/19/2016 FRAX* RESULTS:  (version: 3.5) 10-year Probability of Fracture1 Major Osteoporotic Fracture2 Hip Fracture  19.1% 3.6% Population: [place] (Caucasian) Risk Factors: History of Fracture (Adult) Based on Femur (Right) Neck BMD 1 -The 10-year probability of fracture may be lower than reported if the patient has received treatment. 2 -Major Osteoporotic Fracture: Clinical Spine, Forearm, Hip or Shoulder *FRAX is a Armed forces logistics/support/administrative officertrademark of the [employer], a World Science writerHealth Organization (WHO) [employer]. ASSESSMENT: The probability of a major osteoporotic fracture is 19.1% within the next ten years. The probability of a hip fracture is 3.6% within the next ten years. . Dear Dr. Dale Durhamharlene Shernell Saldierna, Your patient Wendy Nichols completed a BMD test on 11/19/2016 using the Lunar iDXA DXA System (analysis version: 14.10) manufactured by [employer]. The following summarizes the results of our evaluation. PATIENT BIOGRAPHICAL: Name: Wendy PetitFarrell, Wendy Nichols Patient ID: [id number] Birth Date: [birth year] Height: 64.4 in. Gender: Female Exam Date: 11/19/2016 Weight: 141.6 lbs. Indications: Caucasian, Hysterectomy, Postmenopausal, History of Fracture (Adult) Fractures: Right forearm Treatments: ASSESSMENT: The BMD measured at Femur Neck Right is 0.729 g/cm2 with a T-score of -2.2. This patient is considered osteopenic according to World Health Organization [facility](WHO) criteria. Site Region Measured Measured WHO Young Adult BMD Date       Age      Classification T-score AP Spine L1-L4 11/19/2016 65.4 Osteopenia -1.1 1.064 g/cm2 DualFemur Neck Right 11/19/2016 65.4  oriented to person, place, and time.  Psychiatric:        Mood and Affect: Mood normal.        Behavior: Behavior normal.     BP 122/62   Pulse 63   Temp 98.2 F (36.8 C)   Resp 16   Ht 5\' 4"  (1.626 m)   Wt 147 lb 3.2 oz (66.8 kg)   LMP 03/25/1996   SpO2 99%   BMI 25.27 kg/m  Wt Readings from Last 3 Encounters:  12/15/18 147 lb 3.2 oz (66.8 kg)  03/15/18 149 lb 6.4 oz (67.8 kg)  12/08/17 149 lb 12.8 oz (67.9 kg)     Lab Results  Component Value Date   WBC 5.0 12/10/2018   HGB 12.8 12/10/2018   HCT 37.3 12/10/2018   PLT 257.0 12/10/2018   GLUCOSE 103 (H) 12/10/2018   CHOL 176 12/10/2018   TRIG 74.0 12/10/2018   HDL 67.20 12/10/2018   LDLCALC 94 12/10/2018   ALT 10 12/10/2018   AST 14 12/10/2018   NA 137 12/10/2018   K 4.4 12/10/2018   CL 105 12/10/2018   CREATININE 0.93 12/10/2018   BUN 16 12/10/2018   CO2 25 12/10/2018   TSH 2.96 06/15/2018   HGBA1C 5.6 12/10/2018    Dg Bone Density  Result Date: 11/19/2016 EXAM: DUAL X-RAY ABSORPTIOMETRY (DXA) FOR BONE MINERAL DENSITY IMPRESSION: Dear Dr. Dale Durhamharlene Kennett Symes, Your patient Wendy PetitSharron Nichols Nichols completed a FRAX assessment on 11/19/2016 using the Lunar iDXA DXA System (analysis version: 14.10) manufactured by [employer]. The following summarizes the results of our evaluation. PATIENT BIOGRAPHICAL: Name: Wendy PetitFarrell, Wendy Nichols Patient ID: [id number] Birth Date: [birth year] Height:    64.4 in. Gender:     Female    [age]        [age]       Weight:    141.6 lbs. Ethnicity:  White                            Exam Date: 11/19/2016 FRAX* RESULTS:  (version: 3.5) 10-year Probability of Fracture1 Major Osteoporotic Fracture2 Hip Fracture  19.1% 3.6% Population: [place] (Caucasian) Risk Factors: History of Fracture (Adult) Based on Femur (Right) Neck BMD 1 -The 10-year probability of fracture may be lower than reported if the patient has received treatment. 2 -Major Osteoporotic Fracture: Clinical Spine, Forearm, Hip or Shoulder *FRAX is a Armed forces logistics/support/administrative officertrademark of the [employer], a World Science writerHealth Organization (WHO) [employer]. ASSESSMENT: The probability of a major osteoporotic fracture is 19.1% within the next ten years. The probability of a hip fracture is 3.6% within the next ten years. . Dear Dr. Dale Durhamharlene Shernell Saldierna, Your patient Wendy Nichols completed a BMD test on 11/19/2016 using the Lunar iDXA DXA System (analysis version: 14.10) manufactured by [employer]. The following summarizes the results of our evaluation. PATIENT BIOGRAPHICAL: Name: Wendy PetitFarrell, Wendy Nichols Patient ID: [id number] Birth Date: [birth year] Height: 64.4 in. Gender: Female Exam Date: 11/19/2016 Weight: 141.6 lbs. Indications: Caucasian, Hysterectomy, Postmenopausal, History of Fracture (Adult) Fractures: Right forearm Treatments: ASSESSMENT: The BMD measured at Femur Neck Right is 0.729 g/cm2 with a T-score of -2.2. This patient is considered osteopenic according to World Health Organization [facility](WHO) criteria. Site Region Measured Measured WHO Young Adult BMD Date       Age      Classification T-score AP Spine L1-L4 11/19/2016 65.4 Osteopenia -1.1 1.064 g/cm2 DualFemur Neck Right 11/19/2016 65.4

## 2018-12-16 ENCOUNTER — Telehealth: Payer: Self-pay

## 2018-12-16 ENCOUNTER — Ambulatory Visit
Admission: RE | Admit: 2018-12-16 | Discharge: 2018-12-16 | Disposition: A | Discharge status: Home / Self Care | Payer: BC Managed Care – PPO | Source: Ambulatory Visit | Attending: Internal Medicine | Admitting: Internal Medicine

## 2018-12-16 ENCOUNTER — Other Ambulatory Visit: Payer: Self-pay

## 2018-12-16 DIAGNOSIS — M7989 Other specified soft tissue disorders: Secondary | ICD-10-CM | POA: Insufficient documentation

## 2018-12-16 DIAGNOSIS — M79602 Pain in left arm: Secondary | ICD-10-CM | POA: Diagnosis not present

## 2018-12-16 NOTE — Telephone Encounter (Signed)
An elbow strap is what I had recommended her trying.  See result note to notify her of her ultrasound results - negative for clot. Letter typed.    Dr Nicki Reaper

## 2018-12-16 NOTE — Telephone Encounter (Signed)
See previous message.  Elbow strap.  See result note for notification of ultrasound results - negative for clot.  Letter typed.

## 2018-12-16 NOTE — Telephone Encounter (Signed)
Copied from [place] [phone]. Topic: General - Other >> Dec 16, 2018 10:51 AM Leward Quan A wrote: Reason for CRM: Patient called to inquire of Dr Nicki Reaper of what type of brace she need to have for her left arm. She is asking for call back so that she can go to the Medical Supply supply store or pharmacy to purchase the brace or sling that the doctor requested. She also would like a note for her travel so that she can get help with putting her luggage in the overhead compartment on the airplane. Patient is flying out early tomorrow morning so she need her note today and is asking for a call when it is ready for pick up. Ph# [phone]

## 2018-12-16 NOTE — Telephone Encounter (Signed)
Pt aware and will come pick up letter

## 2018-12-20 ENCOUNTER — Encounter: Payer: Self-pay | Admitting: Internal Medicine

## 2018-12-20 NOTE — Assessment & Plan Note (Signed)
Blood pressure under good control.  Continue same medication regimen.  Follow pressures.  Follow metabolic panel.   

## 2018-12-20 NOTE — Assessment & Plan Note (Signed)
Left upper extremity swelling and pain as outlined.  Localized elbow and mid arm.  Increased pain to palpation and with rotation of forearm.  Question of tendonitis.  Given swelling, will schedule ultrasound to confirm no clot.  Discussed if ultrasound negative, gentle use of antiinflammatories and elbow strap.  Call with update.

## 2018-12-20 NOTE — Assessment & Plan Note (Signed)
On simvastatin.  Low cholesterol diet and exercise.  Follow lipid panel and liver function tests.   

## 2018-12-20 NOTE — Assessment & Plan Note (Signed)
On thyroid replacement.  Follow tsh.  

## 2018-12-20 NOTE — Assessment & Plan Note (Signed)
Discussed using steroid nasal spray as directed.  TM - clear.  No infection.  Treat nasal congestion.  Follow.  Notify me if persistent problem.

## 2018-12-23 ENCOUNTER — Other Ambulatory Visit: Payer: Self-pay | Admitting: Internal Medicine

## 2018-12-24 ENCOUNTER — Other Ambulatory Visit: Payer: Self-pay | Admitting: Internal Medicine

## 2018-12-28 ENCOUNTER — Ambulatory Visit: Payer: Self-pay | Admitting: *Deleted

## 2018-12-28 NOTE — Telephone Encounter (Signed)
Summary: shoulder pain    Pt called and stated that she would like to know if she can take an antiinflammatory and would like to know how often she can take this. Pt has shoulder pain. Can leave a message            Attempted to call patient 3 times and no answer. Messages left. Will route to her pcp for evaluation.

## 2018-12-29 NOTE — Telephone Encounter (Signed)
LMTCB

## 2019-01-18 ENCOUNTER — Other Ambulatory Visit: Payer: Self-pay | Admitting: Internal Medicine

## 2019-01-21 ENCOUNTER — Ambulatory Visit: Payer: BC Managed Care – PPO | Admitting: Internal Medicine

## 2019-01-27 ENCOUNTER — Encounter: Payer: Self-pay | Admitting: Internal Medicine

## 2019-01-27 ENCOUNTER — Ambulatory Visit: Payer: BC Managed Care – PPO | Admitting: Internal Medicine

## 2019-01-27 ENCOUNTER — Other Ambulatory Visit: Payer: Self-pay

## 2019-01-27 DIAGNOSIS — Z23 Encounter for immunization: Secondary | ICD-10-CM

## 2019-01-27 DIAGNOSIS — M79602 Pain in left arm: Secondary | ICD-10-CM | POA: Diagnosis not present

## 2019-01-27 DIAGNOSIS — I10 Essential (primary) hypertension: Secondary | ICD-10-CM | POA: Diagnosis not present

## 2019-01-27 NOTE — Progress Notes (Signed)
Patient ID: Wendy Nichols, female   DOB: 1951-04-05, 68 y.o.   MRN: [id number]   Subjective:    Patient ID: Wendy Nichols, female    DOB: June 10, 1950, 68 y.o.   MRN: [id number]  HPI  Patient here for work in appt with concerns regarding left shoulder/arm pain.  Started approximately 2 months ago.  Pain seems to be more localized around her elbow.  She does feel pain into her upper arm and into her forearm.  Noticed recently some posterior shoulder pain, but this has improved.  Increased pain with picking up objects with her arm extended.  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  Having to reach up and get canisters.  This has increased.  No chest pain.  No sob.  No acid reflux.  Blood pressure has been doing well.     Past Medical History:  Diagnosis Date   GERD (gastroesophageal reflux disease)    History of colon polyps    Hypercholesterolemia    Hypertension    Hypothyroidism    Osteoporosis    Pernicious anemia    Past Surgical History:  Procedure Laterality Date   ABDOMINAL HYSTERECTOMY  1997   abnormal uterine bleeding.    BREAST BIOPSY     biopsies in both breasts   DILATION AND CURETTAGE OF UTERUS  1979   Family History  Problem Relation Age of Onset   Breast cancer Mother    Kidney disease Mother    Hypertension Mother    Heart disease Mother    Heart disease Father        myocardial infarction   Breast cancer Maternal Aunt    Breast cancer Maternal Grandmother    Heart disease Brother        myocardial infarction   Brain cancer Brother    Social History   Socioeconomic History   Marital status: Married    Spouse name: Not on file   Number of children: 3   Years of education: Not on file   Highest education level: Not on file  Occupational History    Employer: [employer]  Social Needs   Financial resource strain: Not on file   Food insecurity    Worry: Not on file    Inability: Not on file   Transportation  needs    Medical: Not on file    Non-medical: Not on file  Tobacco Use   Smoking status: Never Smoker   Smokeless tobacco: Never Used  Substance and Sexual Activity   Alcohol use: Yes    Alcohol/week: 0.0 standard drinks    Comment: occasional wine   Drug use: No   Sexual activity: Not on file  Lifestyle   Physical activity    Days per week: Not on file    Minutes per session: Not on file   Stress: Not on file  Relationships   Social connections    Talks on phone: Not on file    Gets together: Not on file    Attends religious service: Not on file    Active member of club or organization: Not on file    Attends meetings of clubs or organizations: Not on file    Relationship status: Not on file  Other Topics Concern   Not on file  Social History Narrative   Not on file    Outpatient Encounter Medications as of 01/27/2019  Medication Sig   ALPRAZolam (XANAX) 0.25 MG tablet Take 1 tablet (0.25  mg total) by mouth daily as needed for anxiety.   aspirin 81 MG tablet Take 81 mg by mouth every other day.   cholecalciferol (VITAMIN D) 25 MCG (1000 UT) tablet Take 1,000 Units by mouth daily.   cyanocobalamin (,VITAMIN B-12,) 1000 MCG/ML injection INJECT 1 ML (1000 MCG TOTAL) INTO MUSCLE EVERY THIRTY DAYS.   fluticasone (FLONASE) 50 MCG/ACT nasal spray Place 2 sprays into both nostrils daily.   levothyroxine (SYNTHROID, LEVOTHROID) 100 MCG tablet TAKE 1 TABLET BY MOUTH DAILY.   lisinopril (ZESTRIL) 10 MG tablet TAKE 1 TABLET (10 MG TOTAL) BY MOUTH DAILY.   metoprolol tartrate (LOPRESSOR) 25 MG tablet TAKE 1/2 TABLET BY MOUTH DAILY.   simvastatin (ZOCOR) 10 MG tablet TAKE 1 TABLET (10 MG TOTAL) BY MOUTH EVERY EVENING.   SYRINGE-NEEDLE, DISP, 3 ML 25G X 1" 3 ML MISC Dose and route does not apply.  To be used to inject 1022mcg/mL Cyanocobalamin IM once every 30 days   Facility-Administered Encounter Medications as of 01/27/2019  Medication   cyanocobalamin  ((VITAMIN B-12)) injection 1,000 mcg   Review of Systems  Constitutional: Negative for appetite change and fever.  Respiratory: Negative for cough, chest tightness and shortness of breath.   Cardiovascular: Negative for chest pain and palpitations.  Gastrointestinal: Negative for abdominal pain, nausea and vomiting.  Musculoskeletal: Negative for back pain.       Left arm pain as outlined.    Skin: Negative for color change and rash.  Neurological: Negative for dizziness, light-headedness and headaches.  Psychiatric/Behavioral: Negative for agitation and dysphoric mood.       Objective:    Physical Exam Constitutional:      General: She is not in acute distress.    Appearance: Normal appearance.  HENT:     Right Ear: External ear normal.     Left Ear: External ear normal.  Eyes:     General: No scleral icterus.       Right eye: No discharge.        Left eye: No discharge.     Conjunctiva/sclera: Conjunctivae normal.  Neck:     Musculoskeletal: Neck supple. No muscular tenderness.     Thyroid: No thyromegaly.  Cardiovascular:     Rate and Rhythm: Normal rate and regular rhythm.  Pulmonary:     Effort: No respiratory distress.     Breath sounds: Normal breath sounds. No wheezing.  Abdominal:     Tenderness: There is abdominal tenderness.  Musculoskeletal:     Comments: Increased pain around left elbow with rotation of her forearm.  Some pain with palpation - adjacent to elbow.  No erythema or warmth.  No pain in shoulder with lifting and fulling extending the arm.  No pain in shoulder with abduction/adduction.    Lymphadenopathy:     Cervical: No cervical adenopathy.  Skin:    Findings: No erythema or rash.  Neurological:     Mental Status: She is alert.  Psychiatric:        Mood and Affect: Mood normal.        Behavior: Behavior normal.     BP 120/78    Pulse 65    Temp (!) 97.2 F (36.2 C)    Resp 16    Wt 147 lb (66.7 kg)    LMP 03/25/1996    SpO2 99%    BMI  25.23 kg/m  Wt Readings from Last 3 Encounters:  01/27/19 147 lb (66.7 kg)  12/15/18 147 lb 3.2 oz (66.8  kg)  03/15/18 149 lb 6.4 oz (67.8 kg)     Lab Results  Component Value Date   WBC 5.0 12/10/2018   HGB 12.8 12/10/2018   HCT 37.3 12/10/2018   PLT 257.0 12/10/2018   GLUCOSE 103 (H) 12/10/2018   CHOL 176 12/10/2018   TRIG 74.0 12/10/2018   HDL 67.20 12/10/2018   LDLCALC 94 12/10/2018   ALT 10 12/10/2018   AST 14 12/10/2018   NA 137 12/10/2018   K 4.4 12/10/2018   CL 105 12/10/2018   CREATININE 0.93 12/10/2018   BUN 16 12/10/2018   CO2 25 12/10/2018   TSH 2.96 06/15/2018   HGBA1C 5.6 12/10/2018    US Venous Img Upper Uni Left  Result Date: 12/16/2018 CLINICAL DATA:  Left arm swelling and pain. EXAM: LEFT UPPER EXTREMITY VENOUS DOPPLER ULTRASOUND TECHNIQUE: Gray-scale sonography with graded compression, as well as color Doppler and duplex ultrasound were performed to evaluate the upper extremity deep venous system from the level of the subclavian vein and including the jugular, axillary, basilic, radial, ulnar and upper cephalic vein. Spectral Doppler was utilized to evaluate flow at rest and with distal augmentation maneuvers. COMPARISON:  No prior. FINDINGS: Contralateral Subclavian Vein: Respiratory phasicity is normal and symmetric with the symptomatic side. No evidence of thrombus. Normal compressibility. Internal Jugular Vein: No evidence of thrombus. Normal compressibility, respiratory phasicity and response to augmentation. Subclavian Vein: No evidence of thrombus. Normal compressibility, respiratory phasicity and response to augmentation. Axillary Vein: No evidence of thrombus. Normal compressibility, respiratory phasicity and response to augmentation. Cephalic Vein: No evidence of thrombus. Normal compressibility, respiratory phasicity and response to augmentation. Basilic Vein: No evidence of thrombus. Normal compressibility, respiratory phasicity and response to  augmentation. Brachial Veins: No evidence of thrombus. Normal compressibility, respiratory phasicity and response to augmentation. Radial Veins: No evidence of thrombus. Normal compressibility, respiratory phasicity and response to augmentation. Ulnar Veins: No evidence of thrombus. Normal compressibility, respiratory phasicity and response to augmentation. Other Findings:  None visualized. IMPRESSION: No evidence of DVT within the left upper extremity. Electronically Signed   By: Maisie Fus  Register   On: 12/16/2018 10:39       Assessment & Plan:   Problem List Items Addressed This Visit    Hypertension    Blood pressure under good control.  Continue same medication regimen.  Follow pressures.  Follow metabolic panel.         Left arm pain    Persistent. Appears to be more localized around the elbow.  Increased pain with rotation of forearm.  Question of tendinitis.  Has tried elbow strap.  Tried tylenol and antiinflammatories.  Persistent pain.  Refer to ortho for further evaluation and treatment.        Relevant Orders   Ambulatory referral to Orthopedic Surgery    Other Visit Diagnoses    Need for immunization against influenza       Relevant Orders   Flu Vaccine QUAD High Dose(Fluad) (Completed)       Dale [place], MD

## 2019-01-30 NOTE — Assessment & Plan Note (Signed)
Persistent. Appears to be more localized around the elbow.  Increased pain with rotation of forearm.  Question of tendinitis.  Has tried elbow strap.  Tried tylenol and antiinflammatories.  Persistent pain.  Refer to ortho for further evaluation and treatment.

## 2019-01-30 NOTE — Assessment & Plan Note (Signed)
Blood pressure under good control.  Continue same medication regimen.  Follow pressures.  Follow metabolic panel.   

## 2019-02-07 DIAGNOSIS — M7712 Lateral epicondylitis, left elbow: Secondary | ICD-10-CM | POA: Diagnosis not present

## 2019-02-07 DIAGNOSIS — M7702 Medial epicondylitis, left elbow: Secondary | ICD-10-CM | POA: Diagnosis not present

## 2019-03-03 DIAGNOSIS — L570 Actinic keratosis: Secondary | ICD-10-CM | POA: Diagnosis not present

## 2019-03-03 DIAGNOSIS — D2271 Melanocytic nevi of right lower limb, including hip: Secondary | ICD-10-CM | POA: Diagnosis not present

## 2019-03-03 DIAGNOSIS — C44519 Basal cell carcinoma of skin of other part of trunk: Secondary | ICD-10-CM | POA: Diagnosis not present

## 2019-03-03 DIAGNOSIS — D2262 Melanocytic nevi of left upper limb, including shoulder: Secondary | ICD-10-CM | POA: Diagnosis not present

## 2019-03-03 DIAGNOSIS — D225 Melanocytic nevi of trunk: Secondary | ICD-10-CM | POA: Diagnosis not present

## 2019-03-03 DIAGNOSIS — D2261 Melanocytic nevi of right upper limb, including shoulder: Secondary | ICD-10-CM | POA: Diagnosis not present

## 2019-03-03 DIAGNOSIS — D485 Neoplasm of uncertain behavior of skin: Secondary | ICD-10-CM | POA: Diagnosis not present

## 2019-03-16 DIAGNOSIS — C44519 Basal cell carcinoma of skin of other part of trunk: Secondary | ICD-10-CM | POA: Diagnosis not present

## 2019-03-29 ENCOUNTER — Other Ambulatory Visit: Payer: Self-pay | Admitting: Internal Medicine

## 2019-03-30 DIAGNOSIS — C44519 Basal cell carcinoma of skin of other part of trunk: Secondary | ICD-10-CM | POA: Diagnosis not present

## 2019-05-05 ENCOUNTER — Ambulatory Visit: Payer: BC Managed Care – PPO | Attending: Internal Medicine

## 2019-05-05 DIAGNOSIS — Z23 Encounter for immunization: Secondary | ICD-10-CM | POA: Insufficient documentation

## 2019-05-09 NOTE — Progress Notes (Signed)
Covid-19 Vaccination Clinic  Name:  ZYKERA MROSS    MRN: [id number] DOB: 1950-09-29  [id number]  Ms. Winbush was observed post Covid-19 immunization for 15 minutes without incidence. She was provided with Vaccine Information Sheet and instruction to access the V-Safe system.   Ms. Wadhwani was instructed to call 911 with any severe reactions post vaccine: Marland Kitchen Difficulty breathing  . Swelling of your face and throat  . A fast heartbeat  . A bad rash all over your body  . Dizziness and weakness    Immunizations Administered    Name Date Dose VIS Date Route   Moderna COVID-19 Vaccine 05/05/2019  5:28 PM 0.5 mL 03/15/2019 Intramuscular   Manufacturer: Moderna   Lot: [id number]   NDC: 18841-660-63

## 2019-06-02 ENCOUNTER — Ambulatory Visit: Payer: Self-pay

## 2019-06-07 ENCOUNTER — Ambulatory Visit: Payer: BC Managed Care – PPO | Attending: Internal Medicine

## 2019-06-07 DIAGNOSIS — Z23 Encounter for immunization: Secondary | ICD-10-CM | POA: Insufficient documentation

## 2019-06-07 NOTE — Progress Notes (Signed)
Covid-19 Vaccination Clinic  Name:  MONACA ISLAND    MRN: [id number] DOB: June 15, 1950  06/07/2019  Ms. Endress was observed post Covid-19 immunization for 15 minutes without incidence. She was provided with Vaccine Information Sheet and instruction to access the V-Safe system.   Ms. Davitt was instructed to call 911 with any severe reactions post vaccine: Marland Kitchen Difficulty breathing  . Swelling of your face and throat  . A fast heartbeat  . A bad rash all over your body  . Dizziness and weakness    Immunizations Administered    Name Date Dose VIS Date Route   Moderna COVID-19 Vaccine 06/07/2019  3:24 PM 0.5 mL 03/15/2019 Intramuscular   Manufacturer: Moderna   Lot: [id number]   NDC: 44010-272-53

## 2019-06-21 ENCOUNTER — Other Ambulatory Visit (INDEPENDENT_AMBULATORY_CARE_PROVIDER_SITE_OTHER): Payer: BC Managed Care – PPO

## 2019-06-21 ENCOUNTER — Other Ambulatory Visit: Payer: Self-pay

## 2019-06-21 DIAGNOSIS — E7800 Pure hypercholesterolemia, unspecified: Secondary | ICD-10-CM | POA: Diagnosis not present

## 2019-06-21 DIAGNOSIS — I10 Essential (primary) hypertension: Secondary | ICD-10-CM | POA: Diagnosis not present

## 2019-06-21 DIAGNOSIS — R739 Hyperglycemia, unspecified: Secondary | ICD-10-CM | POA: Diagnosis not present

## 2019-06-21 LAB — BASIC METABOLIC PANEL
BUN: 14 mg/dL (ref 6–23)
CO2: 27 mEq/L (ref 19–32)
Calcium: 9.3 mg/dL (ref 8.4–10.5)
Chloride: 101 mEq/L (ref 96–112)
Creatinine, Ser: 0.91 mg/dL (ref 0.40–1.20)
GFR: 61.47 mL/min (ref >60.00)
Glucose, Bld: 105 mg/dL — ABNORMAL HIGH (ref 70–99)
Potassium: 4.4 mEq/L (ref 3.5–5.1)
Sodium: 133 mEq/L — ABNORMAL LOW (ref 135–145)

## 2019-06-21 LAB — LIPID PANEL
Cholesterol: 181 mg/dL (ref 0–200)
HDL: 65.7 mg/dL (ref >39.00)
LDL Cholesterol: 100 mg/dL — ABNORMAL HIGH (ref 0–99)
NonHDL: 115.16
Total CHOL/HDL Ratio: 3
Triglycerides: 75 mg/dL (ref 0.0–149.0)
VLDL: 15 mg/dL (ref 0.0–40.0)

## 2019-06-21 LAB — HEPATIC FUNCTION PANEL
ALT: 12 U/L (ref 0–35)
AST: 14 U/L (ref 0–37)
Albumin: 4.1 g/dL (ref 3.5–5.2)
Alkaline Phosphatase: 48 U/L (ref 39–117)
Bilirubin, Direct: 0.1 mg/dL (ref 0.0–0.3)
Total Bilirubin: 0.7 mg/dL (ref 0.2–1.2)
Total Protein: 7 g/dL (ref 6.0–8.3)

## 2019-06-21 LAB — HEMOGLOBIN A1C: Hgb A1c MFr Bld: 5.5 % (ref 4.6–6.5)

## 2019-06-23 ENCOUNTER — Other Ambulatory Visit: Payer: Self-pay | Admitting: Internal Medicine

## 2019-06-23 ENCOUNTER — Other Ambulatory Visit: Payer: Self-pay

## 2019-06-23 ENCOUNTER — Ambulatory Visit (INDEPENDENT_AMBULATORY_CARE_PROVIDER_SITE_OTHER): Payer: BC Managed Care – PPO | Admitting: Internal Medicine

## 2019-06-23 VITALS — BP 122/60 | HR 70 | Temp 96.2°F | Resp 16 | Wt 146.2 lb

## 2019-06-23 DIAGNOSIS — I10 Essential (primary) hypertension: Secondary | ICD-10-CM

## 2019-06-23 DIAGNOSIS — E7800 Pure hypercholesterolemia, unspecified: Secondary | ICD-10-CM | POA: Diagnosis not present

## 2019-06-23 DIAGNOSIS — E871 Hypo-osmolality and hyponatremia: Secondary | ICD-10-CM | POA: Diagnosis not present

## 2019-06-23 DIAGNOSIS — F439 Reaction to severe stress, unspecified: Secondary | ICD-10-CM

## 2019-06-23 DIAGNOSIS — E039 Hypothyroidism, unspecified: Secondary | ICD-10-CM

## 2019-06-23 NOTE — Progress Notes (Signed)
Patient ID: Wendy Nichols, female   DOB: 09/01/1950, 69 y.o.   MRN: [id number]   Subjective:    Patient ID: Wendy Nichols, female    DOB: 1950-07-30, 69 y.o.   MRN: [id number]  HPI This visit occurred during the SARS-CoV-2 public health emergency.  Safety protocols were in place, including screening questions prior to the visit, additional usage of staff PPE, and extensive cleaning of exam room while observing appropriate contact time as indicated for disinfecting solutions.  Patient here for a scheduled follow up.  She reports she is doing well.  Feels good.  Tries to stay active.  No chest pain.  No sob.  No acid reflux.  No abdominal pain. Bowels moving.  Handling stress.  Family doing well.  Discussed lab results.   Past Medical History:  Diagnosis Date  . GERD (gastroesophageal reflux disease)   . History of colon polyps   . Hypercholesterolemia   . Hypertension   . Hypothyroidism   . Osteoporosis   . Pernicious anemia    Past Surgical History:  Procedure Laterality Date  . ABDOMINAL HYSTERECTOMY  1997   abnormal uterine bleeding.   Marland Kitchen BREAST BIOPSY     biopsies in both breasts  . DILATION AND CURETTAGE OF UTERUS  1979   Family History  Problem Relation Age of Onset  . Breast cancer Mother   . Kidney disease Mother   . Hypertension Mother   . Heart disease Mother   . Heart disease Father        myocardial infarction  . Breast cancer Maternal Aunt   . Breast cancer Maternal Grandmother   . Heart disease Brother        myocardial infarction  . Brain cancer Brother    Social History   Socioeconomic History  . Marital status: Married    Spouse name: Not on file  . Number of children: 3  . Years of education: Not on file  . Highest education level: Not on file  Occupational History    Employer: [employer]  Tobacco Use  . Smoking status: Never Smoker  . Smokeless tobacco: Never Used  Substance and Sexual Activity  . Alcohol use: Yes    Alcohol/week: 0.0  standard drinks    Comment: occasional wine  . Drug use: No  . Sexual activity: Not on file  Other Topics Concern  . Not on file  Social History Narrative  . Not on file   Social Determinants of Health   Financial Resource Strain:   . Difficulty of Paying Living Expenses:   Food Insecurity:   . Worried About Programme researcher, broadcasting/film/video in the Last Year:   . Barista in the Last Year:   Transportation Needs:   . Freight forwarder (Medical):   Marland Kitchen Lack of Transportation (Non-Medical):   Physical Activity:   . Days of Exercise per Week:   . Minutes of Exercise per Session:   Stress:   . Feeling of Stress :   Social Connections:   . Frequency of Communication with Friends and Family:   . Frequency of Social Gatherings with Friends and Family:   . Attends Religious Services:   . Active Member of Clubs or Organizations:   . Attends Banker Meetings:   Marland Kitchen Marital Status:     Outpatient Encounter Medications as of 06/23/2019  Medication Sig  . ALPRAZolam (XANAX) 0.25 MG tablet Take 1 tablet (0.25 mg total) by mouth daily as needed  for anxiety.  Marland Kitchen aspirin 81 MG tablet Take 81 mg by mouth every other day.  . cholecalciferol (VITAMIN D) 25 MCG (1000 UT) tablet Take 1,000 Units by mouth daily.  . cyanocobalamin (,VITAMIN B-12,) 1000 MCG/ML injection INJECT 1 ML (1000 MCG TOTAL) INTO MUSCLE EVERY THIRTY DAYS.  . fluticasone (FLONASE) 50 MCG/ACT nasal spray Place 2 sprays into both nostrils daily.  Marland Kitchen levothyroxine (SYNTHROID) 100 MCG tablet TAKE 1 TABLET BY MOUTH DAILY.  Marland Kitchen lisinopril (ZESTRIL) 10 MG tablet TAKE 1 TABLET (10 MG TOTAL) BY MOUTH DAILY.  . metoprolol tartrate (LOPRESSOR) 25 MG tablet TAKE 1/2 TABLET BY MOUTH DAILY.  . simvastatin (ZOCOR) 10 MG tablet TAKE 1 TABLET (10 MG TOTAL) BY MOUTH EVERY EVENING.  . SYRINGE-NEEDLE, DISP, 3 ML 25G X 1" 3 ML MISC Dose and route does not apply.  To be used to inject 1042mcg/mL Cyanocobalamin IM once every 30 days    Facility-Administered Encounter Medications as of 06/23/2019  Medication  . cyanocobalamin ((VITAMIN B-12)) injection 1,000 mcg    Review of Systems  Constitutional: Negative for appetite change and unexpected weight change.  HENT: Negative for congestion and sinus pressure.   Respiratory: Negative for cough, chest tightness and shortness of breath.   Cardiovascular: Negative for chest pain, palpitations and leg swelling.  Gastrointestinal: Negative for abdominal pain, diarrhea, nausea and vomiting.  Genitourinary: Negative for difficulty urinating and dysuria.  Musculoskeletal: Negative for joint swelling and myalgias.  Skin: Negative for color change and rash.  Neurological: Negative for dizziness, light-headedness and headaches.  Psychiatric/Behavioral: Negative for agitation and dysphoric mood.       Objective:    Physical Exam Constitutional:      General: She is not in acute distress.    Appearance: Normal appearance.  HENT:     Head: Normocephalic and atraumatic.     Right Ear: External ear normal.     Left Ear: External ear normal.  Eyes:     General: No scleral icterus.       Right eye: No discharge.        Left eye: No discharge.     Conjunctiva/sclera: Conjunctivae normal.  Neck:     Thyroid: No thyromegaly.  Cardiovascular:     Rate and Rhythm: Normal rate and regular rhythm.  Pulmonary:     Effort: No respiratory distress.     Breath sounds: Normal breath sounds. No wheezing.  Abdominal:     General: Bowel sounds are normal.     Palpations: Abdomen is soft.     Tenderness: There is no abdominal tenderness.  Musculoskeletal:        General: No swelling or tenderness.     Cervical back: Neck supple. No tenderness.  Lymphadenopathy:     Cervical: No cervical adenopathy.  Skin:    Findings: No erythema or rash.  Neurological:     Mental Status: She is alert.  Psychiatric:        Mood and Affect: Mood normal.        Behavior: Behavior normal.      BP 122/60   Pulse 70   Temp (!) 96.2 F (35.7 C)   Resp 16   Wt 146 lb 3.2 oz (66.3 kg)   LMP 03/25/1996   SpO2 99%   BMI 25.10 kg/m  Wt Readings from Last 3 Encounters:  06/23/19 146 lb 3.2 oz (66.3 kg)  01/27/19 147 lb (66.7 kg)  12/15/18 147 lb 3.2 oz (66.8 kg)  Lab Results  Component Value Date   WBC 5.0 12/10/2018   HGB 12.8 12/10/2018   HCT 37.3 12/10/2018   PLT 257.0 12/10/2018   GLUCOSE 105 (H) 06/21/2019   CHOL 181 06/21/2019   TRIG 75.0 06/21/2019   HDL 65.70 06/21/2019   LDLCALC 100 (H) 06/21/2019   ALT 12 06/21/2019   AST 14 06/21/2019   NA 133 (L) 06/21/2019   K 4.4 06/21/2019   CL 101 06/21/2019   CREATININE 0.91 06/21/2019   BUN 14 06/21/2019   CO2 27 06/21/2019   TSH 2.96 06/15/2018   HGBA1C 5.5 06/21/2019    US Venous Img Upper Uni Left  Result Date: 12/16/2018 CLINICAL DATA:  Left arm swelling and pain. EXAM: LEFT UPPER EXTREMITY VENOUS DOPPLER ULTRASOUND TECHNIQUE: Gray-scale sonography with graded compression, as well as color Doppler and duplex ultrasound were performed to evaluate the upper extremity deep venous system from the level of the subclavian vein and including the jugular, axillary, basilic, radial, ulnar and upper cephalic vein. Spectral Doppler was utilized to evaluate flow at rest and with distal augmentation maneuvers. COMPARISON:  No prior. FINDINGS: Contralateral Subclavian Vein: Respiratory phasicity is normal and symmetric with the symptomatic side. No evidence of thrombus. Normal compressibility. Internal Jugular Vein: No evidence of thrombus. Normal compressibility, respiratory phasicity and response to augmentation. Subclavian Vein: No evidence of thrombus. Normal compressibility, respiratory phasicity and response to augmentation. Axillary Vein: No evidence of thrombus. Normal compressibility, respiratory phasicity and response to augmentation. Cephalic Vein: No evidence of thrombus. Normal compressibility, respiratory  phasicity and response to augmentation. Basilic Vein: No evidence of thrombus. Normal compressibility, respiratory phasicity and response to augmentation. Brachial Veins: No evidence of thrombus. Normal compressibility, respiratory phasicity and response to augmentation. Radial Veins: No evidence of thrombus. Normal compressibility, respiratory phasicity and response to augmentation. Ulnar Veins: No evidence of thrombus. Normal compressibility, respiratory phasicity and response to augmentation. Other Findings:  None visualized. IMPRESSION: No evidence of DVT within the left upper extremity. Electronically Signed   By: Maisie Fus  Register   On: 12/16/2018 10:39       Assessment & Plan:   Problem List Items Addressed This Visit    Hypercholesterolemia    On simvastatin.  Low cholesterol diet and exercise.  Follow lipid panel and liver function tests.        Hypertension    Blood pressure under good control.  Continue same medication regimen - lisinopril.   Follow pressures.  Follow metabolic panel.        Hyponatremia - Primary    Slightly decreased sodium.  Recheck soon to confirm wnl.       Relevant Orders   Sodium   Hypothyroidism    On thyroid replacement.  Follow tsh.       Stress    Overall doing better.  Feels better.  Does not need any further intervention.  Follow.            Dale Cameron, MD

## 2019-07-02 ENCOUNTER — Encounter: Payer: Self-pay | Admitting: Internal Medicine

## 2019-07-02 DIAGNOSIS — E871 Hypo-osmolality and hyponatremia: Secondary | ICD-10-CM | POA: Insufficient documentation

## 2019-07-02 NOTE — Assessment & Plan Note (Signed)
Slightly decreased sodium.  Recheck soon to confirm wnl.

## 2019-07-02 NOTE — Assessment & Plan Note (Signed)
Overall doing better.  Feels better.  Does not need any further intervention.  Follow.

## 2019-07-02 NOTE — Assessment & Plan Note (Signed)
On thyroid replacement.  Follow tsh.  

## 2019-07-02 NOTE — Assessment & Plan Note (Signed)
Blood pressure under good control.  Continue same medication regimen - lisinopril.   Follow pressures.  Follow metabolic panel.   

## 2019-07-02 NOTE — Assessment & Plan Note (Signed)
On simvastatin.  Low cholesterol diet and exercise.  Follow lipid panel and liver function tests.   

## 2019-07-21 ENCOUNTER — Other Ambulatory Visit (INDEPENDENT_AMBULATORY_CARE_PROVIDER_SITE_OTHER): Payer: BC Managed Care – PPO

## 2019-07-21 ENCOUNTER — Other Ambulatory Visit: Payer: Self-pay

## 2019-07-21 DIAGNOSIS — E871 Hypo-osmolality and hyponatremia: Secondary | ICD-10-CM

## 2019-07-21 LAB — SODIUM: Sodium: 137 mEq/L (ref 135–145)

## 2019-07-22 ENCOUNTER — Telehealth: Payer: Self-pay | Admitting: Internal Medicine

## 2019-07-22 NOTE — Telephone Encounter (Signed)
Pt.notified

## 2019-07-22 NOTE — Telephone Encounter (Signed)
Patient returning office phone call for lab results. Patient gives verbal ok to leave a detailed message about labs.

## 2019-08-25 ENCOUNTER — Other Ambulatory Visit: Payer: Self-pay | Admitting: Internal Medicine

## 2019-10-10 ENCOUNTER — Encounter: Payer: Self-pay | Admitting: Internal Medicine

## 2019-10-10 DIAGNOSIS — Z803 Family history of malignant neoplasm of breast: Secondary | ICD-10-CM | POA: Diagnosis not present

## 2019-10-10 DIAGNOSIS — Z1231 Encounter for screening mammogram for malignant neoplasm of breast: Secondary | ICD-10-CM | POA: Diagnosis not present

## 2019-10-10 LAB — HM MAMMOGRAPHY

## 2019-10-14 ENCOUNTER — Encounter: Payer: Self-pay | Admitting: Internal Medicine

## 2019-10-17 ENCOUNTER — Telehealth: Payer: Self-pay | Admitting: Internal Medicine

## 2019-10-17 NOTE — Telephone Encounter (Signed)
Need copy of mammogram results.  Last mammogram 09/20/18 - states [facility].  I cannot see any results in care everywhere.  Please confirm pt had mammogram and where.  Please obtain copy for our records.

## 2019-10-18 NOTE — Telephone Encounter (Signed)
Mammogram report received. Done 10/10/19. Abstracted and placed in folder for your initial.

## 2019-11-20 ENCOUNTER — Other Ambulatory Visit: Payer: Self-pay | Admitting: Internal Medicine

## 2019-12-21 ENCOUNTER — Telehealth: Payer: Self-pay | Admitting: *Deleted

## 2019-12-21 DIAGNOSIS — E039 Hypothyroidism, unspecified: Secondary | ICD-10-CM

## 2019-12-21 DIAGNOSIS — I10 Essential (primary) hypertension: Secondary | ICD-10-CM

## 2019-12-21 DIAGNOSIS — Z8639 Personal history of other endocrine, nutritional and metabolic disease: Secondary | ICD-10-CM

## 2019-12-21 DIAGNOSIS — E7800 Pure hypercholesterolemia, unspecified: Secondary | ICD-10-CM

## 2019-12-21 NOTE — Telephone Encounter (Signed)
Please place future orders for lab appt.  

## 2019-12-22 ENCOUNTER — Other Ambulatory Visit: Payer: Self-pay

## 2019-12-22 ENCOUNTER — Encounter: Payer: BC Managed Care – PPO | Admitting: Internal Medicine

## 2019-12-22 ENCOUNTER — Other Ambulatory Visit (INDEPENDENT_AMBULATORY_CARE_PROVIDER_SITE_OTHER): Payer: BC Managed Care – PPO

## 2019-12-22 DIAGNOSIS — E039 Hypothyroidism, unspecified: Secondary | ICD-10-CM | POA: Diagnosis not present

## 2019-12-22 DIAGNOSIS — E7800 Pure hypercholesterolemia, unspecified: Secondary | ICD-10-CM

## 2019-12-22 DIAGNOSIS — Z8639 Personal history of other endocrine, nutritional and metabolic disease: Secondary | ICD-10-CM | POA: Diagnosis not present

## 2019-12-22 DIAGNOSIS — I10 Essential (primary) hypertension: Secondary | ICD-10-CM

## 2019-12-22 LAB — LIPID PANEL
Cholesterol: 160 mg/dL (ref 0–200)
HDL: 65.7 mg/dL (ref >39.00)
LDL Cholesterol: 84 mg/dL (ref 0–99)
NonHDL: 94.66
Total CHOL/HDL Ratio: 2
Triglycerides: 54 mg/dL (ref 0.0–149.0)
VLDL: 10.8 mg/dL (ref 0.0–40.0)

## 2019-12-22 LAB — BASIC METABOLIC PANEL
BUN: 24 mg/dL — ABNORMAL HIGH (ref 6–23)
CO2: 26 mEq/L (ref 19–32)
Calcium: 9.4 mg/dL (ref 8.4–10.5)
Chloride: 105 mEq/L (ref 96–112)
Creatinine, Ser: 0.93 mg/dL (ref 0.40–1.20)
GFR: 59.86 mL/min — ABNORMAL LOW (ref >60.00)
Glucose, Bld: 95 mg/dL (ref 70–99)
Potassium: 4.8 mEq/L (ref 3.5–5.1)
Sodium: 137 mEq/L (ref 135–145)

## 2019-12-22 LAB — CBC WITH DIFFERENTIAL/PLATELET
Basophils Absolute: 0 10*3/uL (ref 0.0–0.1)
Basophils Relative: 0.6 % (ref 0.0–3.0)
Eosinophils Absolute: 0.1 10*3/uL (ref 0.0–0.7)
Eosinophils Relative: 2 % (ref 0.0–5.0)
HCT: 36.6 % (ref 36.0–46.0)
Hemoglobin: 12.6 g/dL (ref 12.0–15.0)
Lymphocytes Relative: 23.7 % (ref 12.0–46.0)
Lymphs Abs: 1.2 10*3/uL (ref 0.7–4.0)
MCHC: 34.4 g/dL (ref 30.0–36.0)
MCV: 90.8 fl (ref 78.0–100.0)
Monocytes Absolute: 0.6 10*3/uL (ref 0.1–1.0)
Monocytes Relative: 10.9 % (ref 3.0–12.0)
Neutro Abs: 3.2 10*3/uL (ref 1.4–7.7)
Neutrophils Relative %: 62.8 % (ref 43.0–77.0)
Platelets: 260 10*3/uL (ref 150.0–400.0)
RBC: 4.03 Mil/uL (ref 3.87–5.11)
RDW: 13 % (ref 11.5–15.5)
WBC: 5.1 10*3/uL (ref 4.0–10.5)

## 2019-12-22 LAB — HEPATIC FUNCTION PANEL
ALT: 13 U/L (ref 0–35)
AST: 14 U/L (ref 0–37)
Albumin: 4.2 g/dL (ref 3.5–5.2)
Alkaline Phosphatase: 41 U/L (ref 39–117)
Bilirubin, Direct: 0.1 mg/dL (ref 0.0–0.3)
Total Bilirubin: 0.5 mg/dL (ref 0.2–1.2)
Total Protein: 6.8 g/dL (ref 6.0–8.3)

## 2019-12-22 LAB — VITAMIN D 25 HYDROXY (VIT D DEFICIENCY, FRACTURES): VITD: 40.89 ng/mL (ref 30.00–100.00)

## 2019-12-22 LAB — TSH: TSH: 1.21 u[IU]/mL (ref 0.35–4.50)

## 2019-12-22 NOTE — Telephone Encounter (Signed)
Orders placed for labs

## 2019-12-27 ENCOUNTER — Other Ambulatory Visit: Payer: Self-pay

## 2019-12-27 ENCOUNTER — Ambulatory Visit (INDEPENDENT_AMBULATORY_CARE_PROVIDER_SITE_OTHER): Payer: BC Managed Care – PPO | Admitting: Internal Medicine

## 2019-12-27 ENCOUNTER — Encounter: Payer: Self-pay | Admitting: Internal Medicine

## 2019-12-27 VITALS — BP 122/62 | HR 62 | Temp 98.6°F | Ht 64.02 in | Wt 149.0 lb

## 2019-12-27 DIAGNOSIS — Z0000 Encounter for general adult medical examination without abnormal findings: Secondary | ICD-10-CM

## 2019-12-27 DIAGNOSIS — F439 Reaction to severe stress, unspecified: Secondary | ICD-10-CM

## 2019-12-27 DIAGNOSIS — E7800 Pure hypercholesterolemia, unspecified: Secondary | ICD-10-CM | POA: Diagnosis not present

## 2019-12-27 DIAGNOSIS — I10 Essential (primary) hypertension: Secondary | ICD-10-CM

## 2019-12-27 DIAGNOSIS — Z23 Encounter for immunization: Secondary | ICD-10-CM

## 2019-12-27 DIAGNOSIS — E039 Hypothyroidism, unspecified: Secondary | ICD-10-CM | POA: Diagnosis not present

## 2019-12-27 MED ORDER — ALPRAZOLAM 0.25 MG PO TABS: 0.2500 mg | ORAL_TABLET | Freq: Every day | ORAL | 0 refills | Status: DC | PRN

## 2019-12-27 NOTE — Progress Notes (Signed)
Patient ID: Wendy Nichols, female   DOB: 11-06-1950, 69 y.o.   MRN: [id number]   Subjective:    Patient ID: Wendy Nichols, female    DOB: 29-Oct-1950, 69 y.o.   MRN: [id number]  HPI This visit occurred during the SARS-CoV-2 public health emergency.  Safety protocols were in place, including screening questions prior to the visit, additional usage of staff PPE, and extensive cleaning of exam room while observing appropriate contact time as indicated for disinfecting solutions.  Patient here for her physical exam.  She is doing well.  Feels good.  Staying active.  No chest pain or sob.  No acid reflux or abdominal pain reported.  Bowels moving.  Had mammogram in June.  Ok.  Up to date with colonoscopy.  Increased stress at work.  Considering retiring at the end of the year.  Overall appears to be handling things relatively well.    Past Medical History:  Diagnosis Date  . GERD (gastroesophageal reflux disease)   . History of colon polyps   . Hypercholesterolemia   . Hypertension   . Hypothyroidism   . Osteoporosis   . Pernicious anemia    Past Surgical History:  Procedure Laterality Date  . ABDOMINAL HYSTERECTOMY  1997   abnormal uterine bleeding.   Marland Kitchen BREAST BIOPSY     biopsies in both breasts  . DILATION AND CURETTAGE OF UTERUS  1979   Family History  Problem Relation Age of Onset  . Breast cancer Mother   . Kidney disease Mother   . Hypertension Mother   . Heart disease Mother   . Heart disease Father        myocardial infarction  . Breast cancer Maternal Aunt   . Breast cancer Maternal Grandmother   . Heart disease Brother        myocardial infarction  . Brain cancer Brother    Social History   Socioeconomic History  . Marital status: Married    Spouse name: Not on file  . Number of children: 3  . Years of education: Not on file  . Highest education level: Not on file  Occupational History    Employer: [employer]  Tobacco Use  . Smoking status: Never Smoker  .  Smokeless tobacco: Never Used  Substance and Sexual Activity  . Alcohol use: Yes    Alcohol/week: 0.0 standard drinks    Comment: occasional wine  . Drug use: No  . Sexual activity: Not on file  Other Topics Concern  . Not on file  Social History Narrative  . Not on file   Social Determinants of Health   Financial Resource Strain:   . Difficulty of Paying Living Expenses: Not on file  Food Insecurity:   . Worried About Programme researcher, broadcasting/film/video in the Last Year: Not on file  . Ran Out of Food in the Last Year: Not on file  Transportation Needs:   . Lack of Transportation (Medical): Not on file  . Lack of Transportation (Non-Medical): Not on file  Physical Activity:   . Days of Exercise per Week: Not on file  . Minutes of Exercise per Session: Not on file  Stress:   . Feeling of Stress : Not on file  Social Connections:   . Frequency of Communication with Friends and Family: Not on file  . Frequency of Social Gatherings with Friends and Family: Not on file  . Attends Religious Services: Not on file  . Active Member of Clubs or Organizations: Not  on file  . Attends Banker Meetings: Not on file  . Marital Status: Not on file    Outpatient Encounter Medications as of 12/27/2019  Medication Sig  . ALPRAZolam (XANAX) 0.25 MG tablet Take 1 tablet (0.25 mg total) by mouth daily as needed for anxiety.  Marland Kitchen aspirin 81 MG tablet Take 81 mg by mouth every other day.  . cholecalciferol (VITAMIN D) 25 MCG (1000 UT) tablet Take 1,000 Units by mouth daily.  . cyanocobalamin (,VITAMIN B-12,) 1000 MCG/ML injection INJECT 1 ML (1000 MCG TOTAL) INTO MUSCLE EVERY THIRTY DAYS.  . fluticasone (FLONASE) 50 MCG/ACT nasal spray Place 2 sprays into both nostrils daily.  Marland Kitchen levothyroxine (SYNTHROID) 100 MCG tablet TAKE 1 TABLET BY MOUTH DAILY.  Marland Kitchen lisinopril (ZESTRIL) 10 MG tablet TAKE 1 TABLET (10 MG TOTAL) BY MOUTH DAILY.  . metoprolol tartrate (LOPRESSOR) 25 MG tablet TAKE 1/2 TABLET BY MOUTH  DAILY.  . simvastatin (ZOCOR) 10 MG tablet TAKE 1 TABLET (10 MG TOTAL) BY MOUTH EVERY EVENING.  . SYRINGE-NEEDLE, DISP, 3 ML 25G X 1" 3 ML MISC Dose and route does not apply.  To be used to inject 1031mcg/mL Cyanocobalamin IM once every 30 days  . [DISCONTINUED] ALPRAZolam (XANAX) 0.25 MG tablet Take 1 tablet (0.25 mg total) by mouth daily as needed for anxiety.   Facility-Administered Encounter Medications as of 12/27/2019  Medication  . cyanocobalamin ((VITAMIN B-12)) injection 1,000 mcg    Review of Systems  Constitutional: Negative for appetite change and unexpected weight change.  HENT: Negative for congestion and sinus pressure.   Eyes: Negative for pain and visual disturbance.  Respiratory: Negative for cough, chest tightness and shortness of breath.   Cardiovascular: Negative for chest pain, palpitations and leg swelling.  Gastrointestinal: Negative for abdominal pain, diarrhea, nausea and vomiting.  Genitourinary: Negative for difficulty urinating and dysuria.  Musculoskeletal: Negative for joint swelling and myalgias.  Skin: Negative for color change and rash.  Neurological: Negative for dizziness, light-headedness and headaches.  Hematological: Negative for adenopathy. Does not bruise/bleed easily.  Psychiatric/Behavioral: Negative for agitation and dysphoric mood.       Increased stress as outlined.        Objective:    Physical Exam Vitals reviewed.  Constitutional:      General: She is not in acute distress.    Appearance: Normal appearance. She is well-developed.  HENT:     Head: Normocephalic and atraumatic.     Right Ear: External ear normal.     Left Ear: External ear normal.  Eyes:     General: No scleral icterus.       Right eye: No discharge.        Left eye: No discharge.     Conjunctiva/sclera: Conjunctivae normal.  Neck:     Thyroid: No thyromegaly.  Cardiovascular:     Rate and Rhythm: Normal rate and regular rhythm.  Pulmonary:     Effort: No  tachypnea, accessory muscle usage or respiratory distress.     Breath sounds: Normal breath sounds. No decreased breath sounds or wheezing.  Chest:     Breasts:        Right: No inverted nipple, mass, nipple discharge or tenderness (no axillary adenopathy).        Left: No inverted nipple, mass, nipple discharge or tenderness (no axilarry adenopathy).  Abdominal:     General: Bowel sounds are normal.     Palpations: Abdomen is soft.     Tenderness: There is no  abdominal tenderness.  Musculoskeletal:        General: No swelling or tenderness.     Cervical back: Neck supple. No tenderness.  Lymphadenopathy:     Cervical: No cervical adenopathy.  Skin:    Findings: No erythema or rash.  Neurological:     Mental Status: She is alert and oriented to person, place, and time.  Psychiatric:        Mood and Affect: Mood normal.        Behavior: Behavior normal.     BP 122/62 (BP Location: Left Arm, Patient Position: Sitting)   Pulse 62   Temp 98.6 F (37 C)   Ht 5' 4.02" (1.626 m)   Wt 149 lb (67.6 kg)   LMP 03/25/1996   SpO2 99%   BMI 25.56 kg/m  Wt Readings from Last 3 Encounters:  12/27/19 149 lb (67.6 kg)  06/23/19 146 lb 3.2 oz (66.3 kg)  01/27/19 147 lb (66.7 kg)     Lab Results  Component Value Date   WBC 5.1 12/22/2019   HGB 12.6 12/22/2019   HCT 36.6 12/22/2019   PLT 260.0 12/22/2019   GLUCOSE 95 12/22/2019   CHOL 160 12/22/2019   TRIG 54.0 12/22/2019   HDL 65.70 12/22/2019   LDLCALC 84 12/22/2019   ALT 13 12/22/2019   AST 14 12/22/2019   NA 137 12/22/2019   K 4.8 12/22/2019   CL 105 12/22/2019   CREATININE 0.93 12/22/2019   BUN 24 (H) 12/22/2019   CO2 26 12/22/2019   TSH 1.21 12/22/2019   HGBA1C 5.5 06/21/2019       Assessment & Plan:   Problem List Items Addressed This Visit    Stress    Increased stress as outlined.  Discussed with her today.  Does not feel she needs any further intervention.  Has good support.  Follow.        Hypothyroidism     On thyroid replacement.  Follow tsh.       Hypertension    Blood pressure under good control.  Continue lisinopril.  Follow pressures.  Follow metabolic panel.       Relevant Orders   Basic metabolic panel   Hypercholesterolemia    On simvastatin.  Low cholesterol diet and exercise.  Follow lipid panel and liver function tests.   Lab Results  Component Value Date   CHOL 160 12/22/2019   HDL 65.70 12/22/2019   LDLCALC 84 12/22/2019   TRIG 54.0 12/22/2019   CHOLHDL 2 12/22/2019        Relevant Orders   Hepatic function panel   Lipid panel   Health care maintenance    Physical today 12/27/19.  PAP 07/2015 - negative with negative HPV.  Mammogram 10/10/19 - negative.  Colonoscopy 02/2018.  Recommended f/u in 10 years.  (Dr Norma Fredrickson).         Other Visit Diagnoses    Need for immunization against influenza    -  Primary   Relevant Orders   Flu Vaccine QUAD High Dose(Fluad) (Completed)       Dale Twain, MD

## 2019-12-27 NOTE — Assessment & Plan Note (Signed)
On simvastatin.  Low cholesterol diet and exercise.  Follow lipid panel and liver function tests.   Lab Results  Component Value Date   CHOL 160 12/22/2019   HDL 65.70 12/22/2019   LDLCALC 84 12/22/2019   TRIG 54.0 12/22/2019   CHOLHDL 2 12/22/2019

## 2019-12-27 NOTE — Assessment & Plan Note (Signed)
Physical today 12/27/19.  PAP 07/2015 - negative with negative HPV.  Mammogram 10/10/19 - negative.  Colonoscopy 02/2018.  Recommended f/u in 10 years.  (Dr Norma Fredrickson).

## 2019-12-28 ENCOUNTER — Encounter: Payer: Self-pay | Admitting: Internal Medicine

## 2020-01-02 ENCOUNTER — Encounter: Payer: Self-pay | Admitting: Internal Medicine

## 2020-01-02 NOTE — Assessment & Plan Note (Signed)
Increased stress as outlined.  Discussed with her today.  Does not feel she needs any further intervention.  Has good support.  Follow.   

## 2020-01-02 NOTE — Assessment & Plan Note (Signed)
Blood pressure under good control.  Continue lisinopril.  Follow pressures.  Follow metabolic panel.  

## 2020-01-02 NOTE — Assessment & Plan Note (Signed)
On thyroid replacement.  Follow tsh.  

## 2020-02-12 ENCOUNTER — Other Ambulatory Visit: Payer: Self-pay | Admitting: Internal Medicine

## 2020-02-28 ENCOUNTER — Other Ambulatory Visit: Payer: Self-pay | Admitting: Internal Medicine

## 2020-02-28 NOTE — Telephone Encounter (Signed)
Refilled: 12/27/2019 Last OV: 12/27/2019 Next OV: 06/26/2020

## 2020-02-29 NOTE — Telephone Encounter (Signed)
Per review of PDMP, it is not showing she had filled a prescription for xanax.  It appears rx was sent in 12/2019.  Please confirm with pharmacy if they have prescription for xanax and if pt refilled.  Also, confirm with pt doing ok.  She does not usually take this medication.

## 2020-03-15 NOTE — Telephone Encounter (Signed)
Pt called back returning your call °

## 2020-03-15 NOTE — Telephone Encounter (Signed)
LMTCB

## 2020-03-22 NOTE — Telephone Encounter (Signed)
Called and spoke to [place] at [facility]. She states that the prescription for 03/15/2020 has not been picked up as of yet. The prescription for 12/27/2019 has been picked up by the patient.

## 2020-04-24 DIAGNOSIS — D2262 Melanocytic nevi of left upper limb, including shoulder: Secondary | ICD-10-CM | POA: Diagnosis not present

## 2020-04-24 DIAGNOSIS — D2261 Melanocytic nevi of right upper limb, including shoulder: Secondary | ICD-10-CM | POA: Diagnosis not present

## 2020-04-24 DIAGNOSIS — Z85828 Personal history of other malignant neoplasm of skin: Secondary | ICD-10-CM | POA: Diagnosis not present

## 2020-04-24 DIAGNOSIS — D225 Melanocytic nevi of trunk: Secondary | ICD-10-CM | POA: Diagnosis not present

## 2020-04-25 ENCOUNTER — Telehealth: Payer: Self-pay | Admitting: Internal Medicine

## 2020-04-25 NOTE — Telephone Encounter (Signed)
Patient called in wanted all are medications switched to [facility] in [place] because her insurance changed don't need any refill right now

## 2020-04-26 NOTE — Telephone Encounter (Signed)
Pharmacy changed. Patient is aware. She does not need any prescriptions at this time.

## 2020-05-07 ENCOUNTER — Telehealth: Payer: Self-pay | Admitting: Internal Medicine

## 2020-05-07 MED ORDER — LEVOTHYROXINE SODIUM 100 MCG PO TABS: 100.0000 ug | ORAL_TABLET | Freq: Every day | ORAL | 3 refills | Status: DC

## 2020-05-07 MED ORDER — METOPROLOL TARTRATE 25 MG PO TABS
12.5000 mg | ORAL_TABLET | Freq: Every day | ORAL | 1 refills | Status: DC
Start: 2020-05-07 — End: 2020-05-11

## 2020-05-07 NOTE — Telephone Encounter (Signed)
[facility]

## 2020-05-07 NOTE — Telephone Encounter (Signed)
Patient called in for refill forlevothyroxine (SYNTHROID) 100 MCG tablet , metoprolol tartrate (LOPRESSOR) 25 MG tablet

## 2020-05-11 ENCOUNTER — Other Ambulatory Visit: Payer: Self-pay

## 2020-05-11 MED ORDER — LEVOTHYROXINE SODIUM 100 MCG PO TABS: 100.0000 ug | ORAL_TABLET | Freq: Every day | ORAL | 3 refills | Status: DC

## 2020-05-11 MED ORDER — METOPROLOL TARTRATE 25 MG PO TABS
12.5000 mg | ORAL_TABLET | Freq: Every day | ORAL | 1 refills | Status: DC
Start: 2020-05-11 — End: 2021-01-11

## 2020-05-11 NOTE — Telephone Encounter (Signed)
Pt said [facility] never received her prescription from [facility] for levothyroxine.

## 2020-05-11 NOTE — Telephone Encounter (Signed)
RX has been resent

## 2020-05-15 DIAGNOSIS — C44319 Basal cell carcinoma of skin of other parts of face: Secondary | ICD-10-CM | POA: Diagnosis not present

## 2020-05-22 DIAGNOSIS — L570 Actinic keratosis: Secondary | ICD-10-CM | POA: Diagnosis not present

## 2020-05-22 DIAGNOSIS — C44719 Basal cell carcinoma of skin of left lower limb, including hip: Secondary | ICD-10-CM | POA: Diagnosis not present

## 2020-05-22 DIAGNOSIS — C44519 Basal cell carcinoma of skin of other part of trunk: Secondary | ICD-10-CM | POA: Diagnosis not present

## 2020-05-22 DIAGNOSIS — C44619 Basal cell carcinoma of skin of left upper limb, including shoulder: Secondary | ICD-10-CM | POA: Diagnosis not present

## 2020-06-19 ENCOUNTER — Telehealth: Payer: Self-pay | Admitting: Internal Medicine

## 2020-06-19 MED ORDER — LISINOPRIL 10 MG PO TABS
10.0000 mg | ORAL_TABLET | Freq: Every day | ORAL | 1 refills | Status: DC
Start: 2020-06-19 — End: 2020-09-17

## 2020-06-19 NOTE — Addendum Note (Signed)
Addended by: Dennie Bible on: 06/19/2020 01:08 PM   Modules accepted: Orders

## 2020-06-19 NOTE — Telephone Encounter (Signed)
Refill sent.

## 2020-06-19 NOTE — Telephone Encounter (Signed)
Pt needs a refill on lisinopril (ZESTRIL) 10 MG tablet sent to [facility]

## 2020-06-21 ENCOUNTER — Other Ambulatory Visit (INDEPENDENT_AMBULATORY_CARE_PROVIDER_SITE_OTHER): Payer: Medicare Other

## 2020-06-21 ENCOUNTER — Other Ambulatory Visit: Payer: Self-pay

## 2020-06-21 DIAGNOSIS — I10 Essential (primary) hypertension: Secondary | ICD-10-CM

## 2020-06-21 DIAGNOSIS — E7800 Pure hypercholesterolemia, unspecified: Secondary | ICD-10-CM | POA: Diagnosis not present

## 2020-06-21 LAB — BASIC METABOLIC PANEL
BUN: 16 mg/dL (ref 6–23)
CO2: 28 mEq/L (ref 19–32)
Calcium: 9.3 mg/dL (ref 8.4–10.5)
Chloride: 105 mEq/L (ref 96–112)
Creatinine, Ser: 1.02 mg/dL (ref 0.40–1.20)
GFR: 56.33 mL/min — ABNORMAL LOW (ref >60.00)
Glucose, Bld: 109 mg/dL — ABNORMAL HIGH (ref 70–99)
Potassium: 4.7 mEq/L (ref 3.5–5.1)
Sodium: 138 mEq/L (ref 135–145)

## 2020-06-21 LAB — LIPID PANEL
Cholesterol: 160 mg/dL (ref 0–200)
HDL: 63.1 mg/dL (ref >39.00)
LDL Cholesterol: 80 mg/dL (ref 0–99)
NonHDL: 96.92
Total CHOL/HDL Ratio: 3
Triglycerides: 84 mg/dL (ref 0.0–149.0)
VLDL: 16.8 mg/dL (ref 0.0–40.0)

## 2020-06-21 LAB — HEPATIC FUNCTION PANEL
ALT: 16 U/L (ref 0–35)
AST: 15 U/L (ref 0–37)
Albumin: 4 g/dL (ref 3.5–5.2)
Alkaline Phosphatase: 41 U/L (ref 39–117)
Bilirubin, Direct: 0.1 mg/dL (ref 0.0–0.3)
Total Bilirubin: 0.6 mg/dL (ref 0.2–1.2)
Total Protein: 6.7 g/dL (ref 6.0–8.3)

## 2020-06-26 ENCOUNTER — Encounter: Payer: Self-pay | Admitting: Internal Medicine

## 2020-06-26 ENCOUNTER — Other Ambulatory Visit: Payer: Self-pay

## 2020-06-26 ENCOUNTER — Ambulatory Visit: Payer: BC Managed Care – PPO | Admitting: Internal Medicine

## 2020-06-26 DIAGNOSIS — I10 Essential (primary) hypertension: Secondary | ICD-10-CM

## 2020-06-26 DIAGNOSIS — E7800 Pure hypercholesterolemia, unspecified: Secondary | ICD-10-CM | POA: Diagnosis not present

## 2020-06-26 DIAGNOSIS — F439 Reaction to severe stress, unspecified: Secondary | ICD-10-CM

## 2020-06-26 DIAGNOSIS — E039 Hypothyroidism, unspecified: Secondary | ICD-10-CM

## 2020-06-26 NOTE — Assessment & Plan Note (Signed)
Blood pressure under good control.  Continue lisinopril.  Follow pressures.  Follow metabolic panel.  

## 2020-06-26 NOTE — Progress Notes (Signed)
Patient ID: Wendy Nichols, female   DOB: 08-11-1950, 70 y.o.   MRN: [id number]   Subjective:    Patient ID: Wendy Nichols, female    DOB: 1950/05/24, 70 y.o.   MRN: [id number]  HPI This visit occurred during the SARS-CoV-2 public health emergency.  Safety protocols were in place, including screening questions prior to the visit, additional usage of staff PPE, and extensive cleaning of exam room while observing appropriate contact time as indicated for disinfecting solutions.  Patient here for a scheduled follow up.  She reports she is doing well.  Feels good.  Trying to stay active.  No chest pain or sob with increased activity or exertion.  No abdominal pain.  Bowels moving.  Handling stress.  Discussed labs.   Past Medical History:  Diagnosis Date  . GERD (gastroesophageal reflux disease)   . History of colon polyps   . Hypercholesterolemia   . Hypertension   . Hypothyroidism   . Osteoporosis   . Pernicious anemia    Past Surgical History:  Procedure Laterality Date  . ABDOMINAL HYSTERECTOMY  1997   abnormal uterine bleeding.   Marland Kitchen BREAST BIOPSY     biopsies in both breasts  . DILATION AND CURETTAGE OF UTERUS  1979   Family History  Problem Relation Age of Onset  . Breast cancer Mother   . Kidney disease Mother   . Hypertension Mother   . Heart disease Mother   . Heart disease Father        myocardial infarction  . Breast cancer Maternal Aunt   . Breast cancer Maternal Grandmother   . Heart disease Brother        myocardial infarction  . Brain cancer Brother    Social History   Socioeconomic History  . Marital status: Married    Spouse name: Not on file  . Number of children: 3  . Years of education: Not on file  . Highest education level: Not on file  Occupational History    Employer: [employer]  Tobacco Use  . Smoking status: Never Smoker  . Smokeless tobacco: Never Used  Substance and Sexual Activity  . Alcohol use: Yes    Alcohol/week: 0.0 standard  drinks    Comment: occasional wine  . Drug use: No  . Sexual activity: Not on file  Other Topics Concern  . Not on file  Social History Narrative  . Not on file   Social Determinants of Health   Financial Resource Strain: Not on file  Food Insecurity: Not on file  Transportation Needs: Not on file  Physical Activity: Not on file  Stress: Not on file  Social Connections: Not on file    Outpatient Encounter Medications as of 06/26/2020  Medication Sig  . ALPRAZolam (XANAX) 0.25 MG tablet TAKE 1 TABLET BY MOUTH DAILY AS NEEDED FOR ANXIETY  . aspirin 81 MG tablet Take 81 mg by mouth every other day.  . cholecalciferol (VITAMIN D) 25 MCG (1000 UT) tablet Take 1,000 Units by mouth daily.  . cyanocobalamin (,VITAMIN B-12,) 1000 MCG/ML injection INJECT 1 ML (1000 MCG TOTAL) INTO MUSCLE EVERY THIRTY DAYS.  Marland Kitchen levothyroxine (SYNTHROID) 100 MCG tablet Take 1 tablet (100 mcg total) by mouth daily.  Marland Kitchen lisinopril (ZESTRIL) 10 MG tablet Take 1 tablet (10 mg total) by mouth daily.  . metoprolol tartrate (LOPRESSOR) 25 MG tablet Take 0.5 tablets (12.5 mg total) by mouth daily.  . simvastatin (ZOCOR) 10 MG tablet TAKE 1 TABLET (10 MG TOTAL)  BY MOUTH EVERY EVENING.  . SYRINGE-NEEDLE, DISP, 3 ML 25G X 1" 3 ML MISC Dose and route does not apply.  To be used to inject 1017mcg/mL Cyanocobalamin IM once every 30 days  . [DISCONTINUED] fluticasone (FLONASE) 50 MCG/ACT nasal spray Place 2 sprays into both nostrils daily.   Facility-Administered Encounter Medications as of 06/26/2020  Medication  . cyanocobalamin ((VITAMIN B-12)) injection 1,000 mcg    Review of Systems  Constitutional: Negative for appetite change and unexpected weight change.  HENT: Negative for congestion and sinus pressure.   Respiratory: Negative for cough, chest tightness and shortness of breath.   Cardiovascular: Negative for chest pain, palpitations and leg swelling.  Gastrointestinal: Negative for abdominal pain, diarrhea,  nausea and vomiting.  Genitourinary: Negative for difficulty urinating and dysuria.  Musculoskeletal: Negative for joint swelling and myalgias.  Skin: Negative for color change and rash.  Neurological: Negative for dizziness, light-headedness and headaches.  Psychiatric/Behavioral: Negative for agitation and dysphoric mood.       Objective:    Physical Exam Vitals reviewed.  Constitutional:      General: She is not in acute distress.    Appearance: Normal appearance.  HENT:     Head: Normocephalic and atraumatic.     Right Ear: External ear normal.     Left Ear: External ear normal.  Eyes:     General: No scleral icterus.       Right eye: No discharge.        Left eye: No discharge.     Conjunctiva/sclera: Conjunctivae normal.  Neck:     Thyroid: No thyromegaly.  Cardiovascular:     Rate and Rhythm: Normal rate and regular rhythm.  Pulmonary:     Effort: No respiratory distress.     Breath sounds: Normal breath sounds. No wheezing.  Abdominal:     General: Bowel sounds are normal.     Palpations: Abdomen is soft.     Tenderness: There is no abdominal tenderness.  Musculoskeletal:        General: No swelling or tenderness.     Cervical back: Neck supple. No tenderness.  Lymphadenopathy:     Cervical: No cervical adenopathy.  Skin:    Findings: No erythema or rash.  Neurological:     Mental Status: She is alert.  Psychiatric:        Mood and Affect: Mood normal.        Behavior: Behavior normal.     BP 118/70   Pulse 74   Temp 97.7 F (36.5 C) (Oral)   Resp 16   Ht 5\' 4"  (1.626 m)   Wt 143 lb 12.8 oz (65.2 kg)   LMP 03/25/1996   SpO2 99%   BMI 24.68 kg/m  Wt Readings from Last 3 Encounters:  06/26/20 143 lb 12.8 oz (65.2 kg)  12/27/19 149 lb (67.6 kg)  06/23/19 146 lb 3.2 oz (66.3 kg)     Lab Results  Component Value Date   WBC 5.1 12/22/2019   HGB 12.6 12/22/2019   HCT 36.6 12/22/2019   PLT 260.0 12/22/2019   GLUCOSE 109 (H) 06/21/2020   CHOL  160 06/21/2020   TRIG 84.0 06/21/2020   HDL 63.10 06/21/2020   LDLCALC 80 06/21/2020   ALT 16 06/21/2020   AST 15 06/21/2020   NA 138 06/21/2020   K 4.7 06/21/2020   CL 105 06/21/2020   CREATININE 1.02 06/21/2020   BUN 16 06/21/2020   CO2 28 06/21/2020   TSH 1.21  12/22/2019   HGBA1C 5.5 06/21/2019    US Venous Img Upper Uni Left  Result Date: 12/16/2018 CLINICAL DATA:  Left arm swelling and pain. EXAM: LEFT UPPER EXTREMITY VENOUS DOPPLER ULTRASOUND TECHNIQUE: Gray-scale sonography with graded compression, as well as color Doppler and duplex ultrasound were performed to evaluate the upper extremity deep venous system from the level of the subclavian vein and including the jugular, axillary, basilic, radial, ulnar and upper cephalic vein. Spectral Doppler was utilized to evaluate flow at rest and with distal augmentation maneuvers. COMPARISON:  No prior. FINDINGS: Contralateral Subclavian Vein: Respiratory phasicity is normal and symmetric with the symptomatic side. No evidence of thrombus. Normal compressibility. Internal Jugular Vein: No evidence of thrombus. Normal compressibility, respiratory phasicity and response to augmentation. Subclavian Vein: No evidence of thrombus. Normal compressibility, respiratory phasicity and response to augmentation. Axillary Vein: No evidence of thrombus. Normal compressibility, respiratory phasicity and response to augmentation. Cephalic Vein: No evidence of thrombus. Normal compressibility, respiratory phasicity and response to augmentation. Basilic Vein: No evidence of thrombus. Normal compressibility, respiratory phasicity and response to augmentation. Brachial Veins: No evidence of thrombus. Normal compressibility, respiratory phasicity and response to augmentation. Radial Veins: No evidence of thrombus. Normal compressibility, respiratory phasicity and response to augmentation. Ulnar Veins: No evidence of thrombus. Normal compressibility, respiratory phasicity  and response to augmentation. Other Findings:  None visualized. IMPRESSION: No evidence of DVT within the left upper extremity. Electronically Signed   By: Maisie Fus  Register   On: 12/16/2018 10:39       Assessment & Plan:   Problem List Items Addressed This Visit    Hypercholesterolemia    On simvastatin.  Low cholesterol diet and exercise.  Follow lipid panel and liver function tests.        Hypertension    Blood pressure under good control.  Continue lisinopril.  Follow pressures.  Follow metabolic panel.       Relevant Orders   Basic metabolic panel   Hypothyroidism    On thyroid replacement.  Follow tsh.       Stress    Handling stress. Doing well.  Follow.           Dale Manley, MD

## 2020-06-27 DIAGNOSIS — L03031 Cellulitis of right toe: Secondary | ICD-10-CM | POA: Diagnosis not present

## 2020-06-27 DIAGNOSIS — L600 Ingrowing nail: Secondary | ICD-10-CM | POA: Diagnosis not present

## 2020-07-07 ENCOUNTER — Encounter: Payer: Self-pay | Admitting: Internal Medicine

## 2020-07-07 NOTE — Assessment & Plan Note (Signed)
On simvastatin.  Low cholesterol diet and exercise.  Follow lipid panel and liver function tests.   

## 2020-07-07 NOTE — Assessment & Plan Note (Signed)
On thyroid replacement.  Follow tsh.  

## 2020-07-07 NOTE — Assessment & Plan Note (Signed)
Handling stress.  Doing well.  Follow.  

## 2020-08-28 ENCOUNTER — Other Ambulatory Visit: Payer: Self-pay

## 2020-08-28 ENCOUNTER — Other Ambulatory Visit (INDEPENDENT_AMBULATORY_CARE_PROVIDER_SITE_OTHER): Payer: Medicare Other

## 2020-08-28 DIAGNOSIS — I10 Essential (primary) hypertension: Secondary | ICD-10-CM | POA: Diagnosis not present

## 2020-08-28 LAB — BASIC METABOLIC PANEL
BUN: 15 mg/dL (ref 6–23)
CO2: 27 mEq/L (ref 19–32)
Calcium: 9.5 mg/dL (ref 8.4–10.5)
Chloride: 103 mEq/L (ref 96–112)
Creatinine, Ser: 0.95 mg/dL (ref 0.40–1.20)
GFR: 61.26 mL/min (ref >60.00)
Glucose, Bld: 101 mg/dL — ABNORMAL HIGH (ref 70–99)
Potassium: 5.1 mEq/L (ref 3.5–5.1)
Sodium: 137 mEq/L (ref 135–145)

## 2020-08-29 ENCOUNTER — Other Ambulatory Visit: Payer: Self-pay | Admitting: Internal Medicine

## 2020-08-29 DIAGNOSIS — E875 Hyperkalemia: Secondary | ICD-10-CM

## 2020-08-29 NOTE — Progress Notes (Signed)
Order placed for f/u potassium check.  

## 2020-09-03 ENCOUNTER — Other Ambulatory Visit: Payer: Self-pay

## 2020-09-03 ENCOUNTER — Other Ambulatory Visit (INDEPENDENT_AMBULATORY_CARE_PROVIDER_SITE_OTHER): Payer: Medicare Other

## 2020-09-03 DIAGNOSIS — E875 Hyperkalemia: Secondary | ICD-10-CM

## 2020-09-03 LAB — POTASSIUM: Potassium: 4.9 mEq/L (ref 3.5–5.1)

## 2020-09-15 ENCOUNTER — Other Ambulatory Visit: Payer: Self-pay | Admitting: Internal Medicine

## 2020-10-04 ENCOUNTER — Other Ambulatory Visit: Payer: Self-pay | Admitting: Internal Medicine

## 2020-10-10 DIAGNOSIS — Z803 Family history of malignant neoplasm of breast: Secondary | ICD-10-CM | POA: Diagnosis not present

## 2020-10-10 DIAGNOSIS — Z1231 Encounter for screening mammogram for malignant neoplasm of breast: Secondary | ICD-10-CM | POA: Diagnosis not present

## 2020-10-10 LAB — HM MAMMOGRAPHY

## 2021-01-01 ENCOUNTER — Other Ambulatory Visit: Payer: Self-pay

## 2021-01-01 ENCOUNTER — Ambulatory Visit (INDEPENDENT_AMBULATORY_CARE_PROVIDER_SITE_OTHER): Payer: Medicare Other | Admitting: Internal Medicine

## 2021-01-01 VITALS — BP 120/70 | HR 62 | Temp 97.8°F | Resp 16 | Ht 64.0 in | Wt 143.2 lb

## 2021-01-01 DIAGNOSIS — E039 Hypothyroidism, unspecified: Secondary | ICD-10-CM

## 2021-01-01 DIAGNOSIS — Z0000 Encounter for general adult medical examination without abnormal findings: Secondary | ICD-10-CM | POA: Diagnosis not present

## 2021-01-01 DIAGNOSIS — E7800 Pure hypercholesterolemia, unspecified: Secondary | ICD-10-CM | POA: Diagnosis not present

## 2021-01-01 DIAGNOSIS — Z86010 Personal history of colonic polyps: Secondary | ICD-10-CM

## 2021-01-01 DIAGNOSIS — F439 Reaction to severe stress, unspecified: Secondary | ICD-10-CM

## 2021-01-01 DIAGNOSIS — I10 Essential (primary) hypertension: Secondary | ICD-10-CM | POA: Diagnosis not present

## 2021-01-01 DIAGNOSIS — Z1159 Encounter for screening for other viral diseases: Secondary | ICD-10-CM

## 2021-01-01 LAB — LIPID PANEL
Cholesterol: 168 mg/dL (ref 0–200)
HDL: 71.8 mg/dL (ref >39.00)
LDL Cholesterol: 84 mg/dL (ref 0–99)
NonHDL: 96.43
Total CHOL/HDL Ratio: 2
Triglycerides: 64 mg/dL (ref 0.0–149.0)
VLDL: 12.8 mg/dL (ref 0.0–40.0)

## 2021-01-01 LAB — BASIC METABOLIC PANEL
BUN: 16 mg/dL (ref 6–23)
CO2: 26 mEq/L (ref 19–32)
Calcium: 9.4 mg/dL (ref 8.4–10.5)
Chloride: 106 mEq/L (ref 96–112)
Creatinine, Ser: 0.83 mg/dL (ref 0.40–1.20)
GFR: 71.87 mL/min (ref >60.00)
Glucose, Bld: 93 mg/dL (ref 70–99)
Potassium: 4.7 mEq/L (ref 3.5–5.1)
Sodium: 140 mEq/L (ref 135–145)

## 2021-01-01 LAB — CBC WITH DIFFERENTIAL/PLATELET
Basophils Absolute: 0 10*3/uL (ref 0.0–0.1)
Basophils Relative: 0.6 % (ref 0.0–3.0)
Eosinophils Absolute: 0.1 10*3/uL (ref 0.0–0.7)
Eosinophils Relative: 1.3 % (ref 0.0–5.0)
HCT: 33.7 % — ABNORMAL LOW (ref 36.0–46.0)
Hemoglobin: 11.7 g/dL — ABNORMAL LOW (ref 12.0–15.0)
Lymphocytes Relative: 20.6 % (ref 12.0–46.0)
Lymphs Abs: 0.9 10*3/uL (ref 0.7–4.0)
MCHC: 34.6 g/dL (ref 30.0–36.0)
MCV: 97.7 fl (ref 78.0–100.0)
Monocytes Absolute: 0.4 10*3/uL (ref 0.1–1.0)
Monocytes Relative: 9.2 % (ref 3.0–12.0)
Neutro Abs: 3 10*3/uL (ref 1.4–7.7)
Neutrophils Relative %: 68.3 % (ref 43.0–77.0)
Platelets: 294 10*3/uL (ref 150.0–400.0)
RBC: 3.45 Mil/uL — ABNORMAL LOW (ref 3.87–5.11)
RDW: 13.2 % (ref 11.5–15.5)
WBC: 4.3 10*3/uL (ref 4.0–10.5)

## 2021-01-01 LAB — HEPATIC FUNCTION PANEL
ALT: 10 U/L (ref 0–35)
AST: 14 U/L (ref 0–37)
Albumin: 4.2 g/dL (ref 3.5–5.2)
Alkaline Phosphatase: 47 U/L (ref 39–117)
Bilirubin, Direct: 0.1 mg/dL (ref 0.0–0.3)
Total Bilirubin: 0.6 mg/dL (ref 0.2–1.2)
Total Protein: 6.9 g/dL (ref 6.0–8.3)

## 2021-01-01 LAB — TSH: TSH: 1.17 u[IU]/mL (ref 0.35–5.50)

## 2021-01-01 NOTE — Assessment & Plan Note (Addendum)
Physical today 01/01/21.  Mammogram 10/10/20 - ok.  Colonoscopy 02/2018.  Recommended f/u in 10 years.

## 2021-01-01 NOTE — Progress Notes (Addendum)
Patient ID: Wendy Nichols, female   DOB: [birth year], 70 y.o.   MRN: [id number]   Subjective:    Patient ID: Wendy Nichols, female    DOB: 10-29-1950, 70 y.o.   MRN: [id number]  This visit occurred during the SARS-CoV-2 public health emergency.  Safety protocols were in place, including screening questions prior to the visit, additional usage of staff PPE, and extensive cleaning of exam room while observing appropriate contact time as indicated for disinfecting solutions.   Patient here for her physical exam.   Chief Complaint  Patient presents with   Annual Exam   .   HPI She is doing well. Stress is better.  Staying active.  No chest pain or sob.  No acid reflux reported.  No abdominal pain.  Bowels moving.  Up to date with mammogram.  Gets through [facility].     Past Medical History:  Diagnosis Date   GERD (gastroesophageal reflux disease)    History of colon polyps    Hypercholesterolemia    Hypertension    Hypothyroidism    Osteoporosis    Pernicious anemia    Past Surgical History:  Procedure Laterality Date   ABDOMINAL HYSTERECTOMY  1997   abnormal uterine bleeding.    BREAST BIOPSY     biopsies in both breasts   DILATION AND CURETTAGE OF UTERUS  1979   Family History  Problem Relation Age of Onset   Breast cancer Mother    Kidney disease Mother    Hypertension Mother    Heart disease Mother    Heart disease Father        myocardial infarction   Breast cancer Maternal Aunt    Breast cancer Maternal Grandmother    Heart disease Brother        myocardial infarction   Brain cancer Brother    Social History   Socioeconomic History   Marital status: Married    Spouse name: Not on file   Number of children: 3   Years of education: Not on file   Highest education level: Not on file  Occupational History    Employer: [employer]  Tobacco Use   Smoking status: Never   Smokeless tobacco: Never  Substance and Sexual Activity   Alcohol use: Yes     Alcohol/week: 0.0 standard drinks    Comment: occasional wine   Drug use: No   Sexual activity: Not on file  Other Topics Concern   Not on file  Social History Narrative   Not on file   Social Determinants of Health   Financial Resource Strain: Not on file  Food Insecurity: Not on file  Transportation Needs: Not on file  Physical Activity: Not on file  Stress: Not on file  Social Connections: Not on file    Review of Systems  Constitutional:  Negative for appetite change and unexpected weight change.  HENT:  Negative for congestion, sinus pressure and sore throat.   Eyes:  Negative for pain and visual disturbance.  Respiratory:  Negative for cough, chest tightness and shortness of breath.   Cardiovascular:  Negative for chest pain, palpitations and leg swelling.  Gastrointestinal:  Negative for abdominal pain, diarrhea, nausea and vomiting.  Genitourinary:  Negative for difficulty urinating and dysuria.  Musculoskeletal:  Negative for joint swelling and myalgias.  Skin:  Negative for color change and rash.  Neurological:  Negative for dizziness, light-headedness and headaches.  Hematological:  Negative for adenopathy. Does not bruise/bleed easily.  Psychiatric/Behavioral:  Negative  for agitation and dysphoric mood.       Objective:     BP 120/70   Pulse 62   Temp 97.8 F (36.6 C)   Resp 16   Ht 5\' 4"  (1.626 m)   Wt 143 lb 3.2 oz (65 kg)   LMP 03/25/1996   SpO2 99%   BMI 24.58 kg/m  Wt Readings from Last 3 Encounters:  01/01/21 143 lb 3.2 oz (65 kg)  06/26/20 143 lb 12.8 oz (65.2 kg)  12/27/19 149 lb (67.6 kg)    Physical Exam Vitals reviewed.  Constitutional:      General: She is not in acute distress.    Appearance: Normal appearance. She is well-developed.  HENT:     Head: Normocephalic and atraumatic.     Right Ear: External ear normal.     Left Ear: External ear normal.  Eyes:     General: No scleral icterus.       Right eye: No discharge.         Left eye: No discharge.     Conjunctiva/sclera: Conjunctivae normal.  Neck:     Thyroid: No thyromegaly.  Cardiovascular:     Rate and Rhythm: Normal rate and regular rhythm.  Pulmonary:     Effort: No tachypnea, accessory muscle usage or respiratory distress.     Breath sounds: Normal breath sounds. No decreased breath sounds or wheezing.  Chest:  Breasts:    Right: No inverted nipple, mass, nipple discharge or tenderness (no axillary adenopathy).     Left: No inverted nipple, mass, nipple discharge or tenderness (no axilarry adenopathy).  Abdominal:     General: Bowel sounds are normal.     Palpations: Abdomen is soft.     Tenderness: There is no abdominal tenderness.  Musculoskeletal:        General: No swelling or tenderness.     Cervical back: Neck supple. No tenderness.  Lymphadenopathy:     Cervical: No cervical adenopathy.  Skin:    Findings: No erythema or rash.  Neurological:     Mental Status: She is alert and oriented to person, place, and time.  Psychiatric:        Mood and Affect: Mood normal.        Behavior: Behavior normal.     Outpatient Encounter Medications as of 01/01/2021  Medication Sig   ALPRAZolam (XANAX) 0.25 MG tablet TAKE 1 TABLET BY MOUTH DAILY AS NEEDED FOR ANXIETY   aspirin 81 MG tablet Take 81 mg by mouth every other day.   cholecalciferol (VITAMIN D) 25 MCG (1000 UT) tablet Take 1,000 Units by mouth daily.   cyanocobalamin (,VITAMIN B-12,) 1000 MCG/ML injection INJECT 1 ML (1000 MCG TOTAL) INTO MUSCLE EVERY THIRTY DAYS.   levothyroxine (SYNTHROID) 100 MCG tablet Take 1 tablet (100 mcg total) by mouth daily.   lisinopril (ZESTRIL) 10 MG tablet TAKE 1 TABLET BY MOUTH DAILY.   metoprolol tartrate (LOPRESSOR) 25 MG tablet Take 0.5 tablets (12.5 mg total) by mouth daily.   simvastatin (ZOCOR) 10 MG tablet TAKE 1 TABLET BY MOUTH EVERY EVENING   SYRINGE-NEEDLE, DISP, 3 ML 25G X 1" 3 ML MISC Dose and route does not apply.  To be used to inject  1059mcg/mL Cyanocobalamin IM once every 30 days   Facility-Administered Encounter Medications as of 01/01/2021  Medication   cyanocobalamin ((VITAMIN B-12)) injection 1,000 mcg     Lab Results  Component Value Date   WBC 4.3 01/01/2021   HGB 11.7 (L)  01/01/2021   HCT 33.7 (L) 01/01/2021   PLT 294.0 01/01/2021   GLUCOSE 93 01/01/2021   CHOL 168 01/01/2021   TRIG 64.0 01/01/2021   HDL 71.80 01/01/2021   LDLCALC 84 01/01/2021   ALT 10 01/01/2021   AST 14 01/01/2021   NA 140 01/01/2021   K 4.7 01/01/2021   CL 106 01/01/2021   CREATININE 0.83 01/01/2021   BUN 16 01/01/2021   CO2 26 01/01/2021   TSH 1.17 01/01/2021   HGBA1C 5.5 06/21/2019    US Venous Img Upper Uni Left  Result Date: 12/16/2018 CLINICAL DATA:  Left arm swelling and pain. EXAM: LEFT UPPER EXTREMITY VENOUS DOPPLER ULTRASOUND TECHNIQUE: Gray-scale sonography with graded compression, as well as color Doppler and duplex ultrasound were performed to evaluate the upper extremity deep venous system from the level of the subclavian vein and including the jugular, axillary, basilic, radial, ulnar and upper cephalic vein. Spectral Doppler was utilized to evaluate flow at rest and with distal augmentation maneuvers. COMPARISON:  No prior. FINDINGS: Contralateral Subclavian Vein: Respiratory phasicity is normal and symmetric with the symptomatic side. No evidence of thrombus. Normal compressibility. Internal Jugular Vein: No evidence of thrombus. Normal compressibility, respiratory phasicity and response to augmentation. Subclavian Vein: No evidence of thrombus. Normal compressibility, respiratory phasicity and response to augmentation. Axillary Vein: No evidence of thrombus. Normal compressibility, respiratory phasicity and response to augmentation. Cephalic Vein: No evidence of thrombus. Normal compressibility, respiratory phasicity and response to augmentation. Basilic Vein: No evidence of thrombus. Normal compressibility, respiratory  phasicity and response to augmentation. Brachial Veins: No evidence of thrombus. Normal compressibility, respiratory phasicity and response to augmentation. Radial Veins: No evidence of thrombus. Normal compressibility, respiratory phasicity and response to augmentation. Ulnar Veins: No evidence of thrombus. Normal compressibility, respiratory phasicity and response to augmentation. Other Findings:  None visualized. IMPRESSION: No evidence of DVT within the left upper extremity. Electronically Signed   By: Maisie Fus  Register   On: 12/16/2018 10:39       Assessment & Plan:   Problem List Items Addressed This Visit     Health care maintenance    Physical today 01/01/21.  Mammogram 10/10/20 - ok.  Colonoscopy 02/2018.  Recommended f/u in 10 years.       History of colonic polyps    Saw Dr Norma Fredrickson.  Colonoscopy 02/2018.  Recommended f/u colonoscopy 10 years.       Hypercholesterolemia    On simvastatin.  Low cholesterol diet and exercise.  Follow lipid panel and liver function tests.        Relevant Orders   Hepatic function panel (Completed)   Lipid panel (Completed)   Hypertension    Blood pressure under good control.  Continue lisinopril.  Follow pressures.  Follow metabolic panel.       Relevant Orders   CBC with Differential/Platelet (Completed)   TSH (Completed)   Basic metabolic panel (Completed)   Hypothyroidism    On thyroid replacement.  Follow tsh.       Stress    Doing better. Follow.       Other Visit Diagnoses     Routine general medical examination at a health care facility    -  Primary   Encounter for hepatitis C screening test for low risk patient       Relevant Orders   Hepatitis C antibody (Completed)        Dale Colquitt, MD

## 2021-01-02 ENCOUNTER — Other Ambulatory Visit: Payer: Self-pay | Admitting: Internal Medicine

## 2021-01-02 DIAGNOSIS — D649 Anemia, unspecified: Secondary | ICD-10-CM

## 2021-01-02 LAB — HEPATITIS C ANTIBODY
Hepatitis C Ab: NONREACTIVE
SIGNAL TO CUT-OFF: 0.01 (ref <1.00)

## 2021-01-02 NOTE — Progress Notes (Signed)
Order placed for f/u labs.  

## 2021-01-03 ENCOUNTER — Other Ambulatory Visit: Payer: Self-pay

## 2021-01-03 ENCOUNTER — Ambulatory Visit (INDEPENDENT_AMBULATORY_CARE_PROVIDER_SITE_OTHER): Payer: Medicare Other

## 2021-01-03 DIAGNOSIS — Z23 Encounter for immunization: Secondary | ICD-10-CM | POA: Diagnosis not present

## 2021-01-06 ENCOUNTER — Encounter: Payer: Self-pay | Admitting: Internal Medicine

## 2021-01-06 NOTE — Assessment & Plan Note (Signed)
Doing better.  Follow.   

## 2021-01-06 NOTE — Assessment & Plan Note (Signed)
On thyroid replacement.  Follow tsh.  

## 2021-01-06 NOTE — Assessment & Plan Note (Signed)
On simvastatin.  Low cholesterol diet and exercise.  Follow lipid panel and liver function tests.   

## 2021-01-06 NOTE — Assessment & Plan Note (Signed)
Blood pressure under good control.  Continue lisinopril.  Follow pressures.  Follow metabolic panel.  

## 2021-01-06 NOTE — Assessment & Plan Note (Signed)
Saw Dr Toledo.  Colonoscopy 02/2018.  Recommended f/u colonoscopy 10 years.  

## 2021-01-08 NOTE — Addendum Note (Signed)
Addended by: Charm Barges on: 01/08/2021 03:49 PM   Modules accepted: Level of Service

## 2021-01-11 ENCOUNTER — Other Ambulatory Visit: Payer: Self-pay | Admitting: Internal Medicine

## 2021-01-14 ENCOUNTER — Other Ambulatory Visit (INDEPENDENT_AMBULATORY_CARE_PROVIDER_SITE_OTHER): Payer: Medicare Other

## 2021-01-14 ENCOUNTER — Other Ambulatory Visit: Payer: Self-pay

## 2021-01-14 DIAGNOSIS — D649 Anemia, unspecified: Secondary | ICD-10-CM | POA: Diagnosis not present

## 2021-01-14 LAB — CBC WITH DIFFERENTIAL/PLATELET
Basophils Absolute: 0 10*3/uL (ref 0.0–0.1)
Basophils Relative: 0.8 % (ref 0.0–3.0)
Eosinophils Absolute: 0.1 10*3/uL (ref 0.0–0.7)
Eosinophils Relative: 2.2 % (ref 0.0–5.0)
HCT: 34.2 % — ABNORMAL LOW (ref 36.0–46.0)
Hemoglobin: 11.6 g/dL — ABNORMAL LOW (ref 12.0–15.0)
Lymphocytes Relative: 23.3 % (ref 12.0–46.0)
Lymphs Abs: 1 10*3/uL (ref 0.7–4.0)
MCHC: 34.1 g/dL (ref 30.0–36.0)
MCV: 91.8 fl (ref 78.0–100.0)
Monocytes Absolute: 0.4 10*3/uL (ref 0.1–1.0)
Monocytes Relative: 8.8 % (ref 3.0–12.0)
Neutro Abs: 2.8 10*3/uL (ref 1.4–7.7)
Neutrophils Relative %: 64.9 % (ref 43.0–77.0)
Platelets: 227 10*3/uL (ref 150.0–400.0)
RBC: 3.72 Mil/uL — ABNORMAL LOW (ref 3.87–5.11)
RDW: 13.3 % (ref 11.5–15.5)
WBC: 4.3 10*3/uL (ref 4.0–10.5)

## 2021-01-14 LAB — IBC + FERRITIN
Ferritin: 79.4 ng/mL (ref 10.0–291.0)
Iron: 69 ug/dL (ref 42–145)
Saturation Ratios: 20.7 % (ref 20.0–50.0)
TIBC: 333.2 ug/dL (ref 250.0–450.0)
Transferrin: 238 mg/dL (ref 212.0–360.0)

## 2021-01-14 LAB — VITAMIN B12: Vitamin B-12: 225 pg/mL (ref 211–911)

## 2021-01-17 ENCOUNTER — Other Ambulatory Visit: Payer: Self-pay

## 2021-01-17 MED ORDER — "SYRINGE/NEEDLE (DISP) 25G X 1"" 3 ML MISC": 0 refills | Status: DC

## 2021-01-17 MED ORDER — CYANOCOBALAMIN 1000 MCG/ML IJ SOLN: INTRAMUSCULAR | 3 refills | Status: DC

## 2021-02-11 DIAGNOSIS — H2513 Age-related nuclear cataract, bilateral: Secondary | ICD-10-CM | POA: Diagnosis not present

## 2021-02-11 DIAGNOSIS — H524 Presbyopia: Secondary | ICD-10-CM | POA: Diagnosis not present

## 2021-04-09 ENCOUNTER — Other Ambulatory Visit: Payer: Self-pay | Admitting: Internal Medicine

## 2021-05-30 DIAGNOSIS — Z03818 Encounter for observation for suspected exposure to other biological agents ruled out: Secondary | ICD-10-CM | POA: Diagnosis not present

## 2021-05-30 DIAGNOSIS — Z20822 Contact with and (suspected) exposure to covid-19: Secondary | ICD-10-CM | POA: Diagnosis not present

## 2021-05-30 DIAGNOSIS — J018 Other acute sinusitis: Secondary | ICD-10-CM | POA: Diagnosis not present

## 2021-06-18 ENCOUNTER — Ambulatory Visit (INDEPENDENT_AMBULATORY_CARE_PROVIDER_SITE_OTHER): Payer: Medicare Other

## 2021-06-18 VITALS — Ht 64.0 in | Wt 136.0 lb

## 2021-06-18 DIAGNOSIS — Z0000 Encounter for general adult medical examination without abnormal findings: Secondary | ICD-10-CM | POA: Diagnosis not present

## 2021-06-18 NOTE — Progress Notes (Signed)
Subjective:   Wendy Nichols is a 71 y.o. female who presents for an Initial Medicare Annual Wellness Visit.  Review of Systems    No ROS.  Medicare Wellness Virtual Visit.  Visual/audio telehealth visit, UTA vital signs.   See social history for additional risk factors.   Cardiac Risk Factors include: advanced age (>28men, >53 women)     Objective:    Today's Vitals   06/18/21 1120  Weight: 136 lb (61.7 kg)  Height: 5\' 4"  (1.626 m)   Body mass index is 23.34 kg/m.  Advanced Directives 06/18/2021  Does Patient Have a Medical Advance Directive? Yes  Type of Estate agent of [place];Living will  Does patient want to make changes to medical advance directive? No - Patient declined  Copy of Healthcare Power of Attorney in Chart? No - copy requested    Current Medications (verified) Outpatient Encounter Medications as of 06/18/2021  Medication Sig   ALPRAZolam (XANAX) 0.25 MG tablet TAKE 1 TABLET BY MOUTH DAILY AS NEEDED FOR ANXIETY   aspirin 81 MG tablet Take 81 mg by mouth every other day.   cholecalciferol (VITAMIN D) 25 MCG (1000 UT) tablet Take 1,000 Units by mouth daily.   cyanocobalamin (,VITAMIN B-12,) 1000 MCG/ML injection Inject 1 mL (1,000 mcg total) into the muscle once a week for 21 days, THEN 1 mL (1,000 mcg total) every 30 (thirty) days.   levothyroxine (SYNTHROID) 100 MCG tablet TAKE 1 TABLET BY MOUTH DAILY.   lisinopril (ZESTRIL) 10 MG tablet TAKE 1 TABLET BY MOUTH DAILY.   metoprolol tartrate (LOPRESSOR) 25 MG tablet TAKE 0.5 TABLET BY MOUTH DAILY.   simvastatin (ZOCOR) 10 MG tablet TAKE 1 TABLET BY MOUTH EVERY EVENING   SYRINGE-NEEDLE, DISP, 3 ML 25G X 1" 3 ML MISC Dose and route does not apply.  To be used to inject 105mcg/mL Cyanocobalamin IM once every 30 days   No facility-administered encounter medications on file as of 06/18/2021.    Allergies (verified) Codeine and Sulfa antibiotics   History: Past Medical History:   Diagnosis Date   GERD (gastroesophageal reflux disease)    History of colon polyps    Hypercholesterolemia    Hypertension    Hypothyroidism    Osteoporosis    Pernicious anemia    Past Surgical History:  Procedure Laterality Date   ABDOMINAL HYSTERECTOMY  1997   abnormal uterine bleeding.    BREAST BIOPSY     biopsies in both breasts   DILATION AND CURETTAGE OF UTERUS  1979   Family History  Problem Relation Age of Onset   Breast cancer Mother    Kidney disease Mother    Hypertension Mother    Heart disease Mother    Heart disease Father        myocardial infarction   Breast cancer Maternal Aunt    Breast cancer Maternal Grandmother    Heart disease Brother        myocardial infarction   Brain cancer Brother    Social History   Socioeconomic History   Marital status: Married    Spouse name: Not on file   Number of children: 3   Years of education: Not on file   Highest education level: Not on file  Occupational History    Employer: [employer]  Tobacco Use   Smoking status: Never   Smokeless tobacco: Never  Substance and Sexual Activity   Alcohol use: Yes    Alcohol/week: 0.0 standard drinks    Comment:  occasional wine   Drug use: No   Sexual activity: Not on file  Other Topics Concern   Not on file  Social History Narrative   Not on file   Social Determinants of Health   Financial Resource Strain: Low Risk    Difficulty of Paying Living Expenses: Not hard at all  Food Insecurity: No Food Insecurity   Worried About Running Out of Food in the Last Year: Never true   Ran Out of Food in the Last Year: Never true  Transportation Needs: No Transportation Needs   Lack of Transportation (Medical): No   Lack of Transportation (Non-Medical): No  Physical Activity: Sufficiently Active   Days of Exercise per Week: 4 days   Minutes of Exercise per Session: 60 min  Stress: No Stress Concern Present   Feeling of Stress : Not at all  Social Connections: Unknown    Frequency of Communication with Friends and Family: More than three times a week   Frequency of Social Gatherings with Friends and Family: More than three times a week   Attends Religious Services: Not on file   Active Member of Clubs or Organizations: Not on file   Attends Banker Meetings: Not on file   Marital Status: Widowed    Tobacco Counseling Counseling given: Not Answered   Clinical Intake:  Pre-visit preparation completed: Yes        Diabetes: No  How often do you need to have someone help you when you read instructions, pamphlets, or other written materials from your doctor or pharmacy?: 1 - Never   Interpreter Needed?: No    Activities of Daily Living In your present state of health, do you have any difficulty performing the following activities: 06/18/2021  Hearing? N  Vision? N  Difficulty concentrating or making decisions? N  Walking or climbing stairs? N  Dressing or bathing? N  Doing errands, shopping? N  Preparing Food and eating ? N  Using the Toilet? N  In the past six months, have you accidently leaked urine? N  Do you have problems with loss of bowel control? N  Managing your Medications? N  Managing your Finances? N  Housekeeping or managing your Housekeeping? N  Some recent data might be hidden   Patient Care Team: Dale Melville, MD as PCP - General (Internal Medicine)  Indicate any recent Medical Services you may have received from other than [facility] providers in the past year (date may be approximate).     Assessment:   This is a routine wellness examination for Wendy Nichols.  Virtual Visit via Telephone Note  I connected with  Wendy Nichols on 06/18/21 at 11:15 AM EST by telephone and verified that I am speaking with the correct person using two identifiers.  Persons participating in the virtual visit: patient/Nurse Health Advisor   I discussed the limitations, risks, security and privacy concerns of performing an  evaluation and management service by telephone and the availability of in person appointments. The patient expressed understanding and agreed to proceed.  Interactive audio and video telecommunications were attempted between this nurse and patient, however failed, due to patient having technical difficulties OR patient did not have access to video capability.  We continued and completed visit with audio only.  Some vital signs may be absent or patient reported.   Hearing/Vision screen Hearing Screening - Comments:: Patient is able to hear conversational tones without difficulty.  No issues reported. Vision Screening - Comments:: Wears corrective lenses They have  seen their ophthalmologist in the last 12 months.    Dietary issues and exercise activities discussed: Current Exercise Habits: Home exercise routine, Type of exercise: treadmill;strength training/weights, Time (Minutes): 60, Frequency (Times/Week): 4, Weekly Exercise (Minutes/Week): 240, Intensity: Mild Healthy diet Good water intake   Goals Addressed             This Visit's Progress    Weight goal 125lb         Depression Screen PHQ 2/9 Scores 06/18/2021 01/01/2021 12/27/2019 12/15/2018 02/02/2017 02/01/2016 08/02/2015  PHQ - 2 Score 0 0 0 0 0 0 0    Fall Risk Fall Risk  06/18/2021 01/01/2021 12/27/2019 12/15/2018 02/02/2017  Falls in the past year? 0 0 0 0 Yes  Number falls in past yr: 0 0 0 - 1  Injury with Fall? - 0 0 - Yes  Risk for fall due to : - No Fall Risks - - Other (Comment)  Follow up Falls evaluation completed Falls evaluation completed - Falls evaluation completed Falls prevention discussed    FALL RISK PREVENTION PERTAINING TO THE HOME: Home free of loose throw rugs in walkways, pet beds, electrical cords, etc? Yes  Adequate lighting in your home to reduce risk of falls? Yes   ASSISTIVE DEVICES UTILIZED TO PREVENT FALLS: Life alert? No  Use of a cane, walker or w/c? No   TIMED UP AND GO: Was the test  performed? No .   Cognitive Function:  Patient is alert and oriented x3.   Enjoys word search and similar brain health activities.     Immunizations Immunization History  Administered Date(s) Administered   DTaP 03/11/2012   Fluad Quad(high Dose 65+) 01/27/2019, 12/27/2019, 01/03/2021   Influenza Split 03/25/2012   Influenza, High Dose Seasonal PF 02/02/2017, 01/07/2018   Influenza,inj,Quad PF,6+ Mos 01/14/2013, 01/06/2014, 02/20/2015, 03/03/2016   Moderna Sars-Covid-2 Vaccination 05/05/2019, 06/07/2019, 04/02/2020   Pneumococcal Conjugate-13 12/08/2017   Pneumococcal Polysaccharide-23 08/26/2016   TDAP status: Due, Education has been provided regarding the importance of this vaccine. Advised may receive this vaccine at local pharmacy or Health Dept. Aware to provide a copy of the vaccination record if obtained from local pharmacy or Health Dept. Verbalized acceptance and understanding.  Shingrix Completed?: No.    Education has been provided regarding the importance of this vaccine. Patient has been advised to call insurance company to determine out of pocket expense if they have not yet received this vaccine. Advised may also receive vaccine at local pharmacy or Health Dept. Verbalized acceptance and understanding.  Screening Tests Health Maintenance  Topic Date Due   TETANUS/TDAP  Never done   Zoster Vaccines- Shingrix (1 of 2) Never done   COVID-19 Vaccine (4 - Booster for Moderna series) 05/28/2020   MAMMOGRAM  10/10/2021   COLONOSCOPY (Pts 45-32yrs Insurance coverage will need to be confirmed)  03/05/2028   Pneumonia Vaccine 38+ Years old  Completed   INFLUENZA VACCINE  Completed   DEXA SCAN  Completed   Hepatitis C Screening  Completed   HPV VACCINES  Aged Out   Health Maintenance Health Maintenance Due  Topic Date Due   TETANUS/TDAP  Never done   Zoster Vaccines- Shingrix (1 of 2) Never done   COVID-19 Vaccine (4 - Booster for Moderna series) 05/28/2020   Lung  Cancer Screening: (Low Dose CT Chest recommended if Age 60-80 years, 30 pack-year currently smoking OR have quit w/in 15years.) does not qualify.   Vision Screening: Recommended annual ophthalmology exams for early detection of  glaucoma and other disorders of the eye.  Dental Screening: Recommended annual dental exams for proper oral hygiene. Visits every 6 months.   Community Resource Referral / Chronic Care Management: CRR required this visit?  No   CCM required this visit?  No      Plan:   Keep all routine maintenance appointments.   I have personally reviewed and noted the following in the patient's chart:   Medical and social history Use of alcohol, tobacco or illicit drugs  Current medications and supplements including opioid prescriptions. Patient is not currently taking opioid prescriptions. Functional ability and status Nutritional status Physical activity Advanced directives List of other physicians Hospitalizations, surgeries, and ER visits in previous 12 months Vitals Screenings to include cognitive, depression, and falls Referrals and appointments  In addition, I have reviewed and discussed with patient certain preventive protocols, quality metrics, and best practice recommendations. A written personalized care plan for preventive services as well as general preventive health recommendations were provided to patient via mychart.     Ashok Pall, LPN   04/19/1094

## 2021-06-18 NOTE — Patient Instructions (Addendum)
Ms. Wendy Nichols , Thank you for taking time to come for your Medicare Wellness Visit. I appreciate your ongoing commitment to your health goals. Please review the following plan we discussed and let me know if I can assist you in the future.   These are the goals we discussed:  Goals      Weight goal 125lb        This is a list of the screening recommended for you and due dates:  Health Maintenance  Topic Date Due   Tetanus Vaccine  Never done   Zoster (Shingles) Vaccine (1 of 2) Never done   COVID-19 Vaccine (4 - Booster for Moderna series) 05/28/2020   Mammogram  10/10/2021   Colon Cancer Screening  03/05/2028   Pneumonia Vaccine  Completed   Flu Shot  Completed   DEXA scan (bone density measurement)  Completed   Hepatitis C Screening: USPSTF Recommendation to screen - Ages 89-79 yo.  Completed   HPV Vaccine  Aged Out    Advanced directives: End of life planning; Advance aging; Advanced directives discussed.  Copy of current HCPOA/Living Will requested.    Conditions/risks identified: none new  Follow up in one year for your annual wellness visit    Preventive Care 65 Years and Older, Female Preventive care refers to lifestyle choices and visits with your health care provider that can promote health and wellness. What does preventive care include? A yearly physical exam. This is also called an annual well check. Dental exams once or twice a year. Routine eye exams. Ask your health care provider how often you should have your eyes checked. Personal lifestyle choices, including: Daily care of your teeth and gums. Regular physical activity. Eating a healthy diet. Avoiding tobacco and drug use. Limiting alcohol use. Practicing safe sex. Taking low-dose aspirin every day. Taking vitamin and mineral supplements as recommended by your health care provider. What happens during an annual well check? The services and screenings done by your health care provider during your annual  well check will depend on your age, overall health, lifestyle risk factors, and family history of disease. Counseling  Your health care provider may ask you questions about your: Alcohol use. Tobacco use. Drug use. Emotional well-being. Home and relationship well-being. Sexual activity. Eating habits. History of falls. Memory and ability to understand (cognition). Work and work Astronomer. Reproductive health. Screening  You may have the following tests or measurements: Height, weight, and BMI. Blood pressure. Lipid and cholesterol levels. These may be checked every 5 years, or more frequently if you are over 43 years old. Skin check. Lung cancer screening. You may have this screening every year starting at age 97 if you have a 30-pack-year history of smoking and currently smoke or have quit within the past 15 years. Fecal occult blood test (FOBT) of the stool. You may have this test every year starting at age 58. Flexible sigmoidoscopy or colonoscopy. You may have a sigmoidoscopy every 5 years or a colonoscopy every 10 years starting at age 82. Hepatitis C blood test. Hepatitis B blood test. Sexually transmitted disease (STD) testing. Diabetes screening. This is done by checking your blood sugar (glucose) after you have not eaten for a while (fasting). You may have this done every 1-3 years. Bone density scan. This is done to screen for osteoporosis. You may have this done starting at age 35. Mammogram. This may be done every 1-2 years. Talk to your health care provider about how often you should have regular  mammograms. Talk with your health care provider about your test results, treatment options, and if necessary, the need for more tests. Vaccines  Your health care provider may recommend certain vaccines, such as: Influenza vaccine. This is recommended every year. Tetanus, diphtheria, and acellular pertussis (Tdap, Td) vaccine. You may need a Td booster every 10 years. Zoster  vaccine. You may need this after age 59. Pneumococcal 13-valent conjugate (PCV13) vaccine. One dose is recommended after age 15. Pneumococcal polysaccharide (PPSV23) vaccine. One dose is recommended after age 27. Talk to your health care provider about which screenings and vaccines you need and how often you need them. This information is not intended to replace advice given to you by your health care provider. Make sure you discuss any questions you have with your health care provider. Document Released: 04/27/2015 Document Revised: 12/19/2015 Document Reviewed: 01/30/2015 Elsevier Interactive Patient Education  2017 [employer].  Fall Prevention in the Home Falls can cause injuries. They can happen to people of all ages. There are many things you can do to make your home safe and to help prevent falls. What can I do on the outside of my home? Regularly fix the edges of walkways and driveways and fix any cracks. Remove anything that might make you trip as you walk through a door, such as a raised step or threshold. Trim any bushes or trees on the path to your home. Use bright outdoor lighting. Clear any walking paths of anything that might make someone trip, such as rocks or tools. Regularly check to see if handrails are loose or broken. Make sure that both sides of any steps have handrails. Any raised decks and porches should have guardrails on the edges. Have any leaves, snow, or ice cleared regularly. Use sand or salt on walking paths during winter. Clean up any spills in your garage right away. This includes oil or grease spills. What can I do in the bathroom? Use night lights. Install grab bars by the toilet and in the tub and shower. Do not use towel bars as grab bars. Use non-skid mats or decals in the tub or shower. If you need to sit down in the shower, use a plastic, non-slip stool. Keep the floor dry. Clean up any water that spills on the floor as soon as it happens. Remove  soap buildup in the tub or shower regularly. Attach bath mats securely with double-sided non-slip rug tape. Do not have throw rugs and other things on the floor that can make you trip. What can I do in the bedroom? Use night lights. Make sure that you have a light by your bed that is easy to reach. Do not use any sheets or blankets that are too big for your bed. They should not hang down onto the floor. Have a firm chair that has side arms. You can use this for support while you get dressed. Do not have throw rugs and other things on the floor that can make you trip. What can I do in the kitchen? Clean up any spills right away. Avoid walking on wet floors. Keep items that you use a lot in easy-to-reach places. If you need to reach something above you, use a strong step stool that has a grab bar. Keep electrical cords out of the way. Do not use floor polish or wax that makes floors slippery. If you must use wax, use non-skid floor wax. Do not have throw rugs and other things on the floor that can  make you trip. What can I do with my stairs? Do not leave any items on the stairs. Make sure that there are handrails on both sides of the stairs and use them. Fix handrails that are broken or loose. Make sure that handrails are as long as the stairways. Check any carpeting to make sure that it is firmly attached to the stairs. Fix any carpet that is loose or worn. Avoid having throw rugs at the top or bottom of the stairs. If you do have throw rugs, attach them to the floor with carpet tape. Make sure that you have a light switch at the top of the stairs and the bottom of the stairs. If you do not have them, ask someone to add them for you. What else can I do to help prevent falls? Wear shoes that: Do not have high heels. Have rubber bottoms. Are comfortable and fit you well. Are closed at the toe. Do not wear sandals. If you use a stepladder: Make sure that it is fully opened. Do not climb a  closed stepladder. Make sure that both sides of the stepladder are locked into place. Ask someone to hold it for you, if possible. Clearly mark and make sure that you can see: Any grab bars or handrails. First and last steps. Where the edge of each step is. Use tools that help you move around (mobility aids) if they are needed. These include: Canes. Walkers. Scooters. Crutches. Turn on the lights when you go into a dark area. Replace any light bulbs as soon as they burn out. Set up your furniture so you have a clear path. Avoid moving your furniture around. If any of your floors are uneven, fix them. If there are any pets around you, be aware of where they are. Review your medicines with your doctor. Some medicines can make you feel dizzy. This can increase your chance of falling. Ask your doctor what other things that you can do to help prevent falls. This information is not intended to replace advice given to you by your health care provider. Make sure you discuss any questions you have with your health care provider. Document Released: 01/25/2009 Document Revised: 09/06/2015 Document Reviewed: 05/05/2014 Elsevier Interactive Patient Education  2017 [employer].

## 2021-06-25 DIAGNOSIS — H524 Presbyopia: Secondary | ICD-10-CM | POA: Diagnosis not present

## 2021-06-26 ENCOUNTER — Other Ambulatory Visit: Payer: Self-pay | Admitting: Internal Medicine

## 2021-07-01 ENCOUNTER — Ambulatory Visit (INDEPENDENT_AMBULATORY_CARE_PROVIDER_SITE_OTHER): Payer: Medicare Other | Admitting: Internal Medicine

## 2021-07-01 ENCOUNTER — Other Ambulatory Visit: Payer: Self-pay

## 2021-07-01 VITALS — BP 126/74 | HR 66 | Temp 97.9°F | Resp 16 | Ht 64.0 in | Wt 137.8 lb

## 2021-07-01 DIAGNOSIS — E538 Deficiency of other specified B group vitamins: Secondary | ICD-10-CM | POA: Diagnosis not present

## 2021-07-01 DIAGNOSIS — D649 Anemia, unspecified: Secondary | ICD-10-CM | POA: Diagnosis not present

## 2021-07-01 DIAGNOSIS — M542 Cervicalgia: Secondary | ICD-10-CM

## 2021-07-01 DIAGNOSIS — I10 Essential (primary) hypertension: Secondary | ICD-10-CM | POA: Diagnosis not present

## 2021-07-01 DIAGNOSIS — J329 Chronic sinusitis, unspecified: Secondary | ICD-10-CM

## 2021-07-01 DIAGNOSIS — E039 Hypothyroidism, unspecified: Secondary | ICD-10-CM

## 2021-07-01 DIAGNOSIS — F439 Reaction to severe stress, unspecified: Secondary | ICD-10-CM

## 2021-07-01 DIAGNOSIS — Z860100 Personal history of colon polyps, unspecified: Secondary | ICD-10-CM

## 2021-07-01 DIAGNOSIS — M8580 Other specified disorders of bone density and structure, unspecified site: Secondary | ICD-10-CM

## 2021-07-01 DIAGNOSIS — E7800 Pure hypercholesterolemia, unspecified: Secondary | ICD-10-CM | POA: Diagnosis not present

## 2021-07-01 LAB — CBC WITH DIFFERENTIAL/PLATELET
Basophils Absolute: 0 10*3/uL (ref 0.0–0.1)
Basophils Relative: 0.6 % (ref 0.0–3.0)
Eosinophils Absolute: 0.1 10*3/uL (ref 0.0–0.7)
Eosinophils Relative: 1.4 % (ref 0.0–5.0)
HCT: 36.8 % (ref 36.0–46.0)
Hemoglobin: 12.6 g/dL (ref 12.0–15.0)
Lymphocytes Relative: 18.2 % (ref 12.0–46.0)
Lymphs Abs: 0.9 10*3/uL (ref 0.7–4.0)
MCHC: 34.2 g/dL (ref 30.0–36.0)
MCV: 89.9 fl (ref 78.0–100.0)
Monocytes Absolute: 0.5 10*3/uL (ref 0.1–1.0)
Monocytes Relative: 8.7 % (ref 3.0–12.0)
Neutro Abs: 3.7 10*3/uL (ref 1.4–7.7)
Neutrophils Relative %: 71.1 % (ref 43.0–77.0)
Platelets: 258 10*3/uL (ref 150.0–400.0)
RBC: 4.09 Mil/uL (ref 3.87–5.11)
RDW: 14 % (ref 11.5–15.5)
WBC: 5.2 10*3/uL (ref 4.0–10.5)

## 2021-07-01 LAB — LIPID PANEL
Cholesterol: 177 mg/dL (ref 0–200)
HDL: 75 mg/dL (ref >39.00)
LDL Cholesterol: 89 mg/dL (ref 0–99)
NonHDL: 102.04
Total CHOL/HDL Ratio: 2
Triglycerides: 66 mg/dL (ref 0.0–149.0)
VLDL: 13.2 mg/dL (ref 0.0–40.0)

## 2021-07-01 LAB — BASIC METABOLIC PANEL
BUN: 16 mg/dL (ref 6–23)
CO2: 27 mEq/L (ref 19–32)
Calcium: 9.5 mg/dL (ref 8.4–10.5)
Chloride: 104 mEq/L (ref 96–112)
Creatinine, Ser: 0.87 mg/dL (ref 0.40–1.20)
GFR: 67.68 mL/min (ref >60.00)
Glucose, Bld: 91 mg/dL (ref 70–99)
Potassium: 4.5 mEq/L (ref 3.5–5.1)
Sodium: 138 mEq/L (ref 135–145)

## 2021-07-01 LAB — HEPATIC FUNCTION PANEL
ALT: 12 U/L (ref 0–35)
AST: 15 U/L (ref 0–37)
Albumin: 4.5 g/dL (ref 3.5–5.2)
Alkaline Phosphatase: 48 U/L (ref 39–117)
Bilirubin, Direct: 0.1 mg/dL (ref 0.0–0.3)
Total Bilirubin: 0.5 mg/dL (ref 0.2–1.2)
Total Protein: 6.9 g/dL (ref 6.0–8.3)

## 2021-07-01 LAB — VITAMIN B12: Vitamin B-12: >1504 pg/mL — ABNORMAL HIGH (ref 211–911)

## 2021-07-01 MED ORDER — AMOXICILLIN-POT CLAVULANATE 875-125 MG PO TABS: 1.0000 | ORAL_TABLET | Freq: Two times a day (BID) | ORAL | 0 refills | Status: DC

## 2021-07-01 NOTE — Patient Instructions (Signed)
Nasacort nasal spray - 2 sprays each nostril one time per day.   

## 2021-07-01 NOTE — Progress Notes (Signed)
Patient ID: Wendy Nichols, female   DOB: Jul 30, 1950, 71 y.o.   MRN: [id number] ? ? ?Subjective:  ? ? Patient ID: Wendy Nichols, female    DOB: 02-May-1950, 71 y.o.   MRN: [id number] ? ?This visit occurred during the SARS-CoV-2 public health emergency.  Safety protocols were in place, including screening questions prior to the visit, additional usage of staff PPE, and extensive cleaning of exam room while observing appropriate contact time as indicated for disinfecting solutions.  ? ?Patient here for a scheduled follow up.  ? ?Chief Complaint  ?Patient presents with  ? Hypertension  ? Hypothyroidism  ? Hyperlipidemia  ? .  ? ?HPI ?Evaluated 05/30/21 - diagnosed with sinus infection.  Treated with augmentin.  Is better, but has had persistent symptoms - nasal congestion, hoarseness, drainage.  Cough is better.  Some residual occasional cough.  No chest congestion, chest pain or sob.  No acid reflux reported.  No fever. Turned her head quickly previously and noticed soreness - left lateral neck and behind her ear.  Is better.  Still will notice occasional discomfort with rotation of her head.  No significant pain. No headache.  Blood pressure is doing well.  No nausea or vomiting.  No abdominal pain or diarrhea.  Handling stress.  ? ? ?Past Medical History:  ?Diagnosis Date  ? GERD (gastroesophageal reflux disease)   ? History of colon polyps   ? Hypercholesterolemia   ? Hypertension   ? Hypothyroidism   ? Osteoporosis   ? Pernicious anemia   ? ?Past Surgical History:  ?Procedure Laterality Date  ? ABDOMINAL HYSTERECTOMY  1997  ? abnormal uterine bleeding.   ? BREAST BIOPSY    ? biopsies in both breasts  ? DILATION AND CURETTAGE OF UTERUS  1979  ? ?Family History  ?Problem Relation Age of Onset  ? Breast cancer Mother   ? Kidney disease Mother   ? Hypertension Mother   ? Heart disease Mother   ? Heart disease Father   ?     myocardial infarction  ? Breast cancer Maternal Aunt   ? Breast cancer Maternal Grandmother   ?  Heart disease Brother   ?     myocardial infarction  ? Brain cancer Brother   ? ?Social History  ? ?Socioeconomic History  ? Marital status: Married  ?  Spouse name: Not on file  ? Number of children: 3  ? Years of education: Not on file  ? Highest education level: Not on file  ?Occupational History  ?  Employer: [employer]  ?Tobacco Use  ? Smoking status: Never  ? Smokeless tobacco: Never  ?Substance and Sexual Activity  ? Alcohol use: Yes  ?  Alcohol/week: 0.0 standard drinks  ?  Comment: occasional wine  ? Drug use: No  ? Sexual activity: Not on file  ?Other Topics Concern  ? Not on file  ?Social History Narrative  ? Not on file  ? ?Social Determinants of Health  ? ?Financial Resource Strain: Low Risk   ? Difficulty of Paying Living Expenses: Not hard at all  ?Food Insecurity: No Food Insecurity  ? Worried About Programme researcher, broadcasting/film/video in the Last Year: Never true  ? Ran Out of Food in the Last Year: Never true  ?Transportation Needs: No Transportation Needs  ? Lack of Transportation (Medical): No  ? Lack of Transportation (Non-Medical): No  ?Physical Activity: Sufficiently Active  ? Days of Exercise per Week: 4 days  ? Minutes of  Exercise per Session: 60 min  ?Stress: No Stress Concern Present  ? Feeling of Stress : Not at all  ?Social Connections: Unknown  ? Frequency of Communication with Friends and Family: More than three times a week  ? Frequency of Social Gatherings with Friends and Family: More than three times a week  ? Attends Religious Services: Not on file  ? Active Member of Clubs or Organizations: Not on file  ? Attends Banker Meetings: Not on file  ? Marital Status: Widowed  ? ? ? ?Review of Systems  ?Constitutional:  Negative for appetite change and unexpected weight change.  ?HENT:  Positive for congestion, postnasal drip and sinus pressure.   ?Respiratory:  Negative for chest tightness and shortness of breath.   ?     Occasional cough.   ?Cardiovascular:  Negative for chest pain,  palpitations and leg swelling.  ?Gastrointestinal:  Negative for abdominal pain, diarrhea, nausea and vomiting.  ?Genitourinary:  Negative for difficulty urinating and dysuria.  ?Musculoskeletal:  Negative for joint swelling and myalgias.  ?Skin:  Negative for color change and rash.  ?Neurological:  Negative for dizziness, light-headedness and headaches.  ?Psychiatric/Behavioral:  Negative for agitation and dysphoric mood.   ? ?   ?Objective:  ?  ? ?BP 126/74   Pulse 66   Temp 97.9 ?F (36.6 ?C)   Resp 16   Ht 5\' 4"  (1.626 m)   Wt 137 lb 12.8 oz (62.5 kg)   LMP 03/25/1996   SpO2 99%   BMI 23.65 kg/m?  ?Wt Readings from Last 3 Encounters:  ?07/01/21 137 lb 12.8 oz (62.5 kg)  ?06/18/21 136 lb (61.7 kg)  ?01/01/21 143 lb 3.2 oz (65 kg)  ? ? ?Physical Exam ?Vitals reviewed.  ?Constitutional:   ?   General: She is not in acute distress. ?   Appearance: Normal appearance.  ?HENT:  ?   Head: Normocephalic and atraumatic.  ?   Comments: Increased tenderness to palpation over sinuses.  ?   Right Ear: External ear normal.  ?   Left Ear: External ear normal.  ?Eyes:  ?   General: No scleral icterus.    ?   Right eye: No discharge.     ?   Left eye: No discharge.  ?   Conjunctiva/sclera: Conjunctivae normal.  ?Neck:  ?   Thyroid: No thyromegaly.  ?   Comments: No tenderness to palpation.  No palpable abnormality.  ?Cardiovascular:  ?   Rate and Rhythm: Normal rate and regular rhythm.  ?Pulmonary:  ?   Effort: No respiratory distress.  ?   Breath sounds: Normal breath sounds. No wheezing.  ?Abdominal:  ?   General: Bowel sounds are normal.  ?   Palpations: Abdomen is soft.  ?   Tenderness: There is no abdominal tenderness.  ?Musculoskeletal:     ?   General: No swelling or tenderness.  ?   Cervical back: Neck supple. No tenderness.  ?Lymphadenopathy:  ?   Cervical: No cervical adenopathy.  ?Skin: ?   Findings: No erythema or rash.  ?Neurological:  ?   Mental Status: She is alert.  ?Psychiatric:     ?   Mood and Affect:  Mood normal.     ?   Behavior: Behavior normal.  ? ? ? ?Outpatient Encounter Medications as of 07/01/2021  ?Medication Sig  ? amoxicillin-clavulanate (AUGMENTIN) 875-125 MG tablet Take 1 tablet by mouth 2 (two) times daily.  ? ALPRAZolam (XANAX) 0.25 MG tablet  TAKE 1 TABLET BY MOUTH DAILY AS NEEDED FOR ANXIETY  ? aspirin 81 MG tablet Take 81 mg by mouth every other day.  ? cholecalciferol (VITAMIN D) 25 MCG (1000 UT) tablet Take 1,000 Units by mouth daily.  ? cyanocobalamin (,VITAMIN B-12,) 1000 MCG/ML injection Inject 1 mL (1,000 mcg total) into the muscle once a week for 21 days, THEN 1 mL (1,000 mcg total) every 30 (thirty) days.  ? levothyroxine (SYNTHROID) 100 MCG tablet TAKE 1 TABLET BY MOUTH DAILY.  ? lisinopril (ZESTRIL) 10 MG tablet TAKE 1 TABLET BY MOUTH DAILY.  ? metoprolol tartrate (LOPRESSOR) 25 MG tablet TAKE 0.5 TABLET BY MOUTH DAILY.  ? simvastatin (ZOCOR) 10 MG tablet TAKE 1 TABLET BY MOUTH EVERY EVENING  ? SYRINGE-NEEDLE, DISP, 3 ML 25G X 1" 3 ML MISC Dose and route does not apply.  To be used to inject 1039mcg/mL Cyanocobalamin IM once every 30 days  ? ?No facility-administered encounter medications on file as of 07/01/2021.  ?  ? ?Lab Results  ?Component Value Date  ? WBC 5.2 07/01/2021  ? HGB 12.6 07/01/2021  ? HCT 36.8 07/01/2021  ? PLT 258.0 07/01/2021  ? GLUCOSE 91 07/01/2021  ? CHOL 177 07/01/2021  ? TRIG 66.0 07/01/2021  ? HDL 75.00 07/01/2021  ? LDLCALC 89 07/01/2021  ? ALT 12 07/01/2021  ? AST 15 07/01/2021  ? NA 138 07/01/2021  ? K 4.5 07/01/2021  ? CL 104 07/01/2021  ? CREATININE 0.87 07/01/2021  ? BUN 16 07/01/2021  ? CO2 27 07/01/2021  ? TSH 1.17 01/01/2021  ? HGBA1C 5.5 06/21/2019  ? ? ?US Venous Img Upper Uni Left ? ?Result Date: 12/16/2018 ?CLINICAL DATA:  Left arm swelling and pain. EXAM: LEFT UPPER EXTREMITY VENOUS DOPPLER ULTRASOUND TECHNIQUE: Gray-scale sonography with graded compression, as well as color Doppler and duplex ultrasound were performed to evaluate the upper extremity  deep venous system from the level of the subclavian vein and including the jugular, axillary, basilic, radial, ulnar and upper cephalic vein. Spectral Doppler was utilized to evaluate flow at rest and with distal augm

## 2021-07-02 ENCOUNTER — Other Ambulatory Visit: Payer: Self-pay

## 2021-07-02 ENCOUNTER — Telehealth: Payer: Self-pay

## 2021-07-02 ENCOUNTER — Encounter: Payer: Self-pay | Admitting: Internal Medicine

## 2021-07-02 DIAGNOSIS — E7800 Pure hypercholesterolemia, unspecified: Secondary | ICD-10-CM

## 2021-07-02 DIAGNOSIS — J329 Chronic sinusitis, unspecified: Secondary | ICD-10-CM | POA: Insufficient documentation

## 2021-07-02 DIAGNOSIS — M542 Cervicalgia: Secondary | ICD-10-CM | POA: Insufficient documentation

## 2021-07-02 MED ORDER — ROSUVASTATIN CALCIUM 10 MG PO TABS: 10.0000 mg | ORAL_TABLET | Freq: Every day | ORAL | 3 refills | Status: DC

## 2021-07-02 NOTE — Assessment & Plan Note (Signed)
Saw Dr Toledo.  Colonoscopy 02/2018.  Recommended f/u colonoscopy 10 years.  

## 2021-07-02 NOTE — Assessment & Plan Note (Signed)
Doing better.  Follow.   

## 2021-07-02 NOTE — Assessment & Plan Note (Signed)
Blood pressure under good control.  Continue lisinopril.  Follow pressures.  Follow metabolic panel.  

## 2021-07-02 NOTE — Telephone Encounter (Signed)
I spoke with patient & she will call back for lab results.  ?

## 2021-07-02 NOTE — Telephone Encounter (Signed)
See result note.  

## 2021-07-02 NOTE — Assessment & Plan Note (Signed)
Is better.  No significant pain now.  No pain on exam.  No palpable abnormality.  Follow. Call with update.  ?

## 2021-07-02 NOTE — Telephone Encounter (Signed)
Pt returning call. Pt requesting callback.  °

## 2021-07-02 NOTE — Assessment & Plan Note (Signed)
On simvastatin.  Low cholesterol diet and exercise.  Follow lipid panel and liver function tests.   

## 2021-07-02 NOTE — Assessment & Plan Note (Signed)
Check B12 level. 

## 2021-07-02 NOTE — Assessment & Plan Note (Signed)
Calcium and vitamin D Weight bearing exercise 

## 2021-07-02 NOTE — Assessment & Plan Note (Signed)
Increased sinus pressure and head congestion.  Persistent symptoms - better, but not resolved.  Will treat with augmentin.  Probiotics as directed.  Saline nasal spray and nasacort nasal spray as directed.  Discussed robitussin.  Call with update.   ?

## 2021-07-02 NOTE — Telephone Encounter (Signed)
Pt returning call back 

## 2021-07-02 NOTE — Assessment & Plan Note (Signed)
On thyroid replacement.  Follow tsh.  

## 2021-08-16 ENCOUNTER — Other Ambulatory Visit: Payer: Self-pay | Admitting: Internal Medicine

## 2021-08-21 ENCOUNTER — Other Ambulatory Visit (INDEPENDENT_AMBULATORY_CARE_PROVIDER_SITE_OTHER): Payer: Medicare Other

## 2021-08-21 DIAGNOSIS — E7800 Pure hypercholesterolemia, unspecified: Secondary | ICD-10-CM | POA: Diagnosis not present

## 2021-08-21 LAB — HEPATIC FUNCTION PANEL
ALT: 21 U/L (ref 0–35)
AST: 20 U/L (ref 0–37)
Albumin: 4.3 g/dL (ref 3.5–5.2)
Alkaline Phosphatase: 44 U/L (ref 39–117)
Bilirubin, Direct: 0.1 mg/dL (ref 0.0–0.3)
Total Bilirubin: 0.6 mg/dL (ref 0.2–1.2)
Total Protein: 6.9 g/dL (ref 6.0–8.3)

## 2021-08-22 ENCOUNTER — Telehealth: Payer: Self-pay | Admitting: Internal Medicine

## 2021-08-22 NOTE — Telephone Encounter (Signed)
Pt called about lab results. °

## 2021-08-23 NOTE — Telephone Encounter (Signed)
Pt called about update of lab results ?

## 2021-08-26 NOTE — Telephone Encounter (Signed)
Attempted to call pt - no ans, no vm, phone just rang ?

## 2021-08-30 NOTE — Telephone Encounter (Signed)
Patient called for lab results.  Patient states she will be leaving for work in about 30 minutes and won't be able to talk there.  Please call.

## 2021-09-02 NOTE — Telephone Encounter (Signed)
Lm for pt to cb.

## 2021-09-13 ENCOUNTER — Other Ambulatory Visit: Payer: Self-pay | Admitting: Internal Medicine

## 2021-09-13 ENCOUNTER — Other Ambulatory Visit: Payer: Self-pay | Admitting: Family

## 2021-10-22 DIAGNOSIS — Z1231 Encounter for screening mammogram for malignant neoplasm of breast: Secondary | ICD-10-CM | POA: Diagnosis not present

## 2021-10-22 DIAGNOSIS — Z803 Family history of malignant neoplasm of breast: Secondary | ICD-10-CM | POA: Diagnosis not present

## 2021-10-22 LAB — HM MAMMOGRAPHY

## 2021-11-01 ENCOUNTER — Other Ambulatory Visit: Payer: Self-pay | Admitting: Internal Medicine

## 2021-12-12 ENCOUNTER — Other Ambulatory Visit: Payer: Self-pay | Admitting: Internal Medicine

## 2021-12-19 ENCOUNTER — Other Ambulatory Visit: Payer: Self-pay | Admitting: Internal Medicine

## 2022-01-01 ENCOUNTER — Ambulatory Visit (INDEPENDENT_AMBULATORY_CARE_PROVIDER_SITE_OTHER): Payer: Medicare Other | Admitting: Internal Medicine

## 2022-01-01 ENCOUNTER — Encounter: Payer: Self-pay | Admitting: Internal Medicine

## 2022-01-01 VITALS — BP 122/68 | HR 68 | Temp 97.8°F | Ht 64.0 in | Wt 135.8 lb

## 2022-01-01 DIAGNOSIS — Z8639 Personal history of other endocrine, nutritional and metabolic disease: Secondary | ICD-10-CM

## 2022-01-01 DIAGNOSIS — F439 Reaction to severe stress, unspecified: Secondary | ICD-10-CM

## 2022-01-01 DIAGNOSIS — Z23 Encounter for immunization: Secondary | ICD-10-CM

## 2022-01-01 DIAGNOSIS — E039 Hypothyroidism, unspecified: Secondary | ICD-10-CM | POA: Diagnosis not present

## 2022-01-01 DIAGNOSIS — Z86010 Personal history of colonic polyps: Secondary | ICD-10-CM

## 2022-01-01 DIAGNOSIS — I10 Essential (primary) hypertension: Secondary | ICD-10-CM

## 2022-01-01 DIAGNOSIS — Z0000 Encounter for general adult medical examination without abnormal findings: Secondary | ICD-10-CM

## 2022-01-01 DIAGNOSIS — E538 Deficiency of other specified B group vitamins: Secondary | ICD-10-CM

## 2022-01-01 DIAGNOSIS — E2839 Other primary ovarian failure: Secondary | ICD-10-CM

## 2022-01-01 DIAGNOSIS — M8580 Other specified disorders of bone density and structure, unspecified site: Secondary | ICD-10-CM

## 2022-01-01 DIAGNOSIS — E7800 Pure hypercholesterolemia, unspecified: Secondary | ICD-10-CM

## 2022-01-01 LAB — TSH: TSH: 0.6 u[IU]/mL (ref 0.35–5.50)

## 2022-01-01 LAB — LIPID PANEL
Cholesterol: 169 mg/dL (ref 0–200)
HDL: 71.5 mg/dL (ref >39.00)
LDL Cholesterol: 86 mg/dL (ref 0–99)
NonHDL: 97.69
Total CHOL/HDL Ratio: 2
Triglycerides: 58 mg/dL (ref 0.0–149.0)
VLDL: 11.6 mg/dL (ref 0.0–40.0)

## 2022-01-01 LAB — BASIC METABOLIC PANEL
BUN: 21 mg/dL (ref 6–23)
CO2: 25 mEq/L (ref 19–32)
Calcium: 9.3 mg/dL (ref 8.4–10.5)
Chloride: 104 mEq/L (ref 96–112)
Creatinine, Ser: 0.92 mg/dL (ref 0.40–1.20)
GFR: 63.07 mL/min (ref >60.00)
Glucose, Bld: 87 mg/dL (ref 70–99)
Potassium: 4.5 mEq/L (ref 3.5–5.1)
Sodium: 136 mEq/L (ref 135–145)

## 2022-01-01 LAB — HEPATIC FUNCTION PANEL
ALT: 12 U/L (ref 0–35)
AST: 14 U/L (ref 0–37)
Albumin: 4.1 g/dL (ref 3.5–5.2)
Alkaline Phosphatase: 43 U/L (ref 39–117)
Bilirubin, Direct: 0.1 mg/dL (ref 0.0–0.3)
Total Bilirubin: 0.8 mg/dL (ref 0.2–1.2)
Total Protein: 6.6 g/dL (ref 6.0–8.3)

## 2022-01-01 MED ORDER — CYANOCOBALAMIN 1000 MCG/ML IJ SOLN
1000.0000 ug | Freq: Once | INTRAMUSCULAR | Status: AC
  Administered 2022-01-01: 1000 ug via INTRAMUSCULAR

## 2022-01-01 NOTE — Patient Instructions (Signed)
Bone density - 02/20/22 - 9:00 am.  [facility].    Do not take calcium supplements the day of the exam.

## 2022-01-01 NOTE — Assessment & Plan Note (Signed)
Changed to crestor - last labs.  Low cholesterol diet and exercise.  Follow lipid panel and liver function tests.

## 2022-01-01 NOTE — Assessment & Plan Note (Signed)
Discussed calcium and vitamin D.  Weight bearing exercise.  Scheduled for bone density.

## 2022-01-01 NOTE — Assessment & Plan Note (Signed)
Blood pressure under good control.  Continue lisinopril.  Follow pressures.  Follow metabolic panel.  

## 2022-01-01 NOTE — Progress Notes (Signed)
Patient ID: Wendy Nichols, female   DOB: [birth year], 72 y.o.   MRN: [id number]   Subjective:    Patient ID: Wendy Nichols, female    DOB: 05-Apr-1951, 71 y.o.   MRN: [id number]   Patient here for physical exam .   HPI Here for her physical exam.  She is doing well.  Feels good.  Staying active.  No chest pain or sob reported.  No abdominal pain.  Bowels moving.  Discussed bone density.  Agreeable.     Past Medical History:  Diagnosis Date   GERD (gastroesophageal reflux disease)    History of colon polyps    Hypercholesterolemia    Hypertension    Hypothyroidism    Osteoporosis    Pernicious anemia    Past Surgical History:  Procedure Laterality Date   ABDOMINAL HYSTERECTOMY  1997   abnormal uterine bleeding.    BREAST BIOPSY     biopsies in both breasts   DILATION AND CURETTAGE OF UTERUS  1979   Family History  Problem Relation Age of Onset   Breast cancer Mother    Kidney disease Mother    Hypertension Mother    Heart disease Mother    Heart disease Father        myocardial infarction   Breast cancer Maternal Aunt    Breast cancer Maternal Grandmother    Heart disease Brother        myocardial infarction   Brain cancer Brother    Social History   Socioeconomic History   Marital status: Married    Spouse name: Not on file   Number of children: 3   Years of education: Not on file   Highest education level: Not on file  Occupational History    Employer: [employer]  Tobacco Use   Smoking status: Never   Smokeless tobacco: Never  Substance and Sexual Activity   Alcohol use: Yes    Alcohol/week: 0.0 standard drinks of alcohol    Comment: occasional wine   Drug use: No   Sexual activity: Not on file  Other Topics Concern   Not on file  Social History Narrative   Not on file   Social Determinants of Health   Financial Resource Strain: Low Risk  (06/18/2021)   Overall Financial Resource Strain (CARDIA)    Difficulty of Paying Living Expenses: Not hard  at all  Food Insecurity: No Food Insecurity (06/18/2021)   Hunger Vital Sign    Worried About Running Out of Food in the Last Year: Never true    Ran Out of Food in the Last Year: Never true  Transportation Needs: No Transportation Needs (06/18/2021)   PRAPARE - Administrator, Civil Service (Medical): No    Lack of Transportation (Non-Medical): No  Physical Activity: Sufficiently Active (06/18/2021)   Exercise Vital Sign    Days of Exercise per Week: 4 days    Minutes of Exercise per Session: 60 min  Stress: No Stress Concern Present (06/18/2021)   [employer] of Occupational Health - Occupational Stress Questionnaire    Feeling of Stress : Not at all  Social Connections: Unknown (06/18/2021)   Social Connection and Isolation Panel [NHANES]    Frequency of Communication with Friends and Family: More than three times a week    Frequency of Social Gatherings with Friends and Family: More than three times a week    Attends Religious Services: Not on file    Active Member of Clubs or Organizations: Not  on file    Attends Club or Organization Meetings: Not on file    Marital Status: Widowed     Review of Systems  Constitutional:  Negative for appetite change and unexpected weight change.  HENT:  Negative for congestion, sinus pressure and sore throat.   Eyes:  Negative for pain and visual disturbance.  Respiratory:  Negative for cough, chest tightness and shortness of breath.   Cardiovascular:  Negative for chest pain, palpitations and leg swelling.  Gastrointestinal:  Negative for abdominal pain, diarrhea, nausea and vomiting.  Genitourinary:  Negative for difficulty urinating and dysuria.  Musculoskeletal:  Negative for joint swelling and myalgias.  Skin:  Negative for color change and rash.  Neurological:  Negative for dizziness, light-headedness and headaches.  Hematological:  Negative for adenopathy. Does not bruise/bleed easily.  Psychiatric/Behavioral:  Negative for  agitation and dysphoric mood.        Objective:     BP 122/68 (BP Location: Left Arm, Patient Position: Sitting, Cuff Size: Normal)   Pulse 68   Temp 97.8 F (36.6 C) (Oral)   Ht 5\' 4"  (1.626 m)   Wt 135 lb 12.8 oz (61.6 kg)   LMP 03/25/1996   SpO2 99%   BMI 23.31 kg/m  Wt Readings from Last 3 Encounters:  01/01/22 135 lb 12.8 oz (61.6 kg)  07/01/21 137 lb 12.8 oz (62.5 kg)  06/18/21 136 lb (61.7 kg)    Physical Exam Vitals reviewed.  Constitutional:      General: She is not in acute distress.    Appearance: Normal appearance. She is well-developed.  HENT:     Head: Normocephalic and atraumatic.     Right Ear: External ear normal.     Left Ear: External ear normal.  Eyes:     General: No scleral icterus.       Right eye: No discharge.        Left eye: No discharge.     Conjunctiva/sclera: Conjunctivae normal.  Neck:     Thyroid: No thyromegaly.  Cardiovascular:     Rate and Rhythm: Normal rate and regular rhythm.  Pulmonary:     Effort: No tachypnea, accessory muscle usage or respiratory distress.     Breath sounds: Normal breath sounds. No decreased breath sounds or wheezing.  Chest:  Breasts:    Right: No inverted nipple, mass, nipple discharge or tenderness (no axillary adenopathy).     Left: No inverted nipple, mass, nipple discharge or tenderness (no axilarry adenopathy).  Abdominal:     General: Bowel sounds are normal.     Palpations: Abdomen is soft.     Tenderness: There is no abdominal tenderness.  Musculoskeletal:        General: No swelling or tenderness.     Cervical back: Neck supple.  Lymphadenopathy:     Cervical: No cervical adenopathy.  Skin:    Findings: No erythema or rash.  Neurological:     Mental Status: She is alert and oriented to person, place, and time.  Psychiatric:        Mood and Affect: Mood normal.        Behavior: Behavior normal.      Outpatient Encounter Medications as of 01/01/2022  Medication Sig   ALPRAZolam  (XANAX) 0.25 MG tablet TAKE 1 TABLET BY MOUTH DAILY AS NEEDED FOR ANXIETY   aspirin 81 MG tablet Take 81 mg by mouth every other day.   cholecalciferol (VITAMIN D) 25 MCG (1000 UT) tablet Take 1,000 Units by  mouth daily.   cyanocobalamin (,VITAMIN B-12,) 1000 MCG/ML injection INJECT INTO THE MUSCLE ONCE WEEKLY FOR 21 DAYS, THEN EVERY 30 DAYS AS DIRECTED.   levothyroxine (SYNTHROID) 100 MCG tablet TAKE 1 TABLET BY MOUTH DAILY.   lisinopril (ZESTRIL) 10 MG tablet TAKE 1 TABLET BY MOUTH DAILY.   metoprolol tartrate (LOPRESSOR) 25 MG tablet TAKE 0.5 TABLET BY MOUTH DAILY.   rosuvastatin (CRESTOR) 10 MG tablet Take 1 tablet (10 mg total) by mouth daily.   SYRINGE-NEEDLE, DISP, 3 ML 25G X 1" 3 ML MISC Dose and route does not apply.  To be used to inject 1025mcg/mL Cyanocobalamin IM once every 30 days   [DISCONTINUED] amoxicillin-clavulanate (AUGMENTIN) 875-125 MG tablet Take 1 tablet by mouth 2 (two) times daily.   [DISCONTINUED] simvastatin (ZOCOR) 10 MG tablet TAKE 1 TABLET BY MOUTH EVERY EVENING   [EXPIRED] cyanocobalamin (VITAMIN B12) injection 1,000 mcg    No facility-administered encounter medications on file as of 01/01/2022.     Lab Results  Component Value Date   WBC 5.2 07/01/2021   HGB 12.6 07/01/2021   HCT 36.8 07/01/2021   PLT 258.0 07/01/2021   GLUCOSE 87 01/01/2022   CHOL 169 01/01/2022   TRIG 58.0 01/01/2022   HDL 71.50 01/01/2022   LDLCALC 86 01/01/2022   ALT 12 01/01/2022   AST 14 01/01/2022   NA 136 01/01/2022   K 4.5 01/01/2022   CL 104 01/01/2022   CREATININE 0.92 01/01/2022   BUN 21 01/01/2022   CO2 25 01/01/2022   TSH 0.60 01/01/2022   HGBA1C 5.5 06/21/2019    US Venous Img Upper Uni Left  Result Date: 12/16/2018 CLINICAL DATA:  Left arm swelling and pain. EXAM: LEFT UPPER EXTREMITY VENOUS DOPPLER ULTRASOUND TECHNIQUE: Gray-scale sonography with graded compression, as well as color Doppler and duplex ultrasound were performed to evaluate the upper  extremity deep venous system from the level of the subclavian vein and including the jugular, axillary, basilic, radial, ulnar and upper cephalic vein. Spectral Doppler was utilized to evaluate flow at rest and with distal augmentation maneuvers. COMPARISON:  No prior. FINDINGS: Contralateral Subclavian Vein: Respiratory phasicity is normal and symmetric with the symptomatic side. No evidence of thrombus. Normal compressibility. Internal Jugular Vein: No evidence of thrombus. Normal compressibility, respiratory phasicity and response to augmentation. Subclavian Vein: No evidence of thrombus. Normal compressibility, respiratory phasicity and response to augmentation. Axillary Vein: No evidence of thrombus. Normal compressibility, respiratory phasicity and response to augmentation. Cephalic Vein: No evidence of thrombus. Normal compressibility, respiratory phasicity and response to augmentation. Basilic Vein: No evidence of thrombus. Normal compressibility, respiratory phasicity and response to augmentation. Brachial Veins: No evidence of thrombus. Normal compressibility, respiratory phasicity and response to augmentation. Radial Veins: No evidence of thrombus. Normal compressibility, respiratory phasicity and response to augmentation. Ulnar Veins: No evidence of thrombus. Normal compressibility, respiratory phasicity and response to augmentation. Other Findings:  None visualized. IMPRESSION: No evidence of DVT within the left upper extremity. Electronically Signed   By: Maisie Fus  Register   On: 12/16/2018 10:39       Assessment & Plan:   Problem List Items Addressed This Visit     B12 deficiency    B12 injection.       Health care maintenance    Physical today 01/01/22.  Mammogram 10/23/21- negative.  Recommended f/u in one year.  Colonoscopy 02/2018.  Recommended f/u colonoscopy in 10 years.        History of colonic polyps  Saw Dr Norma Fredrickson.  Colonoscopy 02/2018.  Recommended f/u colonoscopy 10 years.        History of vitamin D deficiency    Follow vitamin D level.       Hypercholesterolemia    Changed to crestor - last labs.  Low cholesterol diet and exercise.  Follow lipid panel and liver function tests.        Relevant Orders   Hepatic function panel (Completed)   Lipid panel (Completed)   Hypertension    Blood pressure under good control.  Continue lisinopril.  Follow pressures.  Follow metabolic panel.       Relevant Orders   Basic metabolic panel (Completed)   Hypothyroidism - Primary    On thyroid replacement.  Follow tsh.       Relevant Orders   TSH (Completed)   Osteopenia    Discussed calcium and vitamin D.  Weight bearing exercise.  Scheduled for bone density.       Stress    Doing better.  Follow.       Other Visit Diagnoses     Need for immunization against influenza       Relevant Orders   Flu Vaccine QUAD High Dose(Fluad) (Completed)   Estrogen deficiency       Relevant Orders   DG Bone Density        Dale Industry, MD

## 2022-01-01 NOTE — Assessment & Plan Note (Signed)
Physical today 01/01/22.  Mammogram 10/23/21- negative.  Recommended f/u in one year.  Colonoscopy 02/2018.  Recommended f/u colonoscopy in 10 years.

## 2022-01-01 NOTE — Assessment & Plan Note (Signed)
On thyroid replacement.  Follow tsh.  

## 2022-01-05 ENCOUNTER — Encounter: Payer: Self-pay | Admitting: Internal Medicine

## 2022-01-05 NOTE — Assessment & Plan Note (Signed)
B12 injection 

## 2022-01-05 NOTE — Assessment & Plan Note (Signed)
Saw Dr Alice Reichert.  Colonoscopy 02/2018.  Recommended f/u colonoscopy 10 years.

## 2022-01-05 NOTE — Assessment & Plan Note (Signed)
Doing better.  Follow.   

## 2022-01-05 NOTE — Assessment & Plan Note (Signed)
Follow vitamin D level.  

## 2022-02-20 ENCOUNTER — Ambulatory Visit
Admission: RE | Admit: 2022-02-20 | Discharge: 2022-02-20 | Disposition: A | Discharge status: Home / Self Care | Payer: Medicare Other | Source: Ambulatory Visit | Attending: Internal Medicine | Admitting: Internal Medicine

## 2022-02-20 DIAGNOSIS — E2839 Other primary ovarian failure: Secondary | ICD-10-CM | POA: Insufficient documentation

## 2022-02-20 DIAGNOSIS — M810 Age-related osteoporosis without current pathological fracture: Secondary | ICD-10-CM | POA: Diagnosis not present

## 2022-02-21 ENCOUNTER — Telehealth: Payer: Self-pay

## 2022-02-21 NOTE — Telephone Encounter (Signed)
A message has been sent to Coding for review.

## 2022-02-25 ENCOUNTER — Ambulatory Visit (INDEPENDENT_AMBULATORY_CARE_PROVIDER_SITE_OTHER): Payer: Medicare Other | Admitting: Internal Medicine

## 2022-02-25 ENCOUNTER — Encounter: Payer: Self-pay | Admitting: Internal Medicine

## 2022-02-25 VITALS — BP 130/80 | HR 81 | Temp 97.7°F | Ht 62.0 in | Wt 139.0 lb

## 2022-02-25 DIAGNOSIS — F439 Reaction to severe stress, unspecified: Secondary | ICD-10-CM

## 2022-02-25 DIAGNOSIS — I10 Essential (primary) hypertension: Secondary | ICD-10-CM

## 2022-02-25 DIAGNOSIS — Z8639 Personal history of other endocrine, nutritional and metabolic disease: Secondary | ICD-10-CM

## 2022-02-25 DIAGNOSIS — M810 Age-related osteoporosis without current pathological fracture: Secondary | ICD-10-CM

## 2022-02-25 NOTE — Progress Notes (Signed)
ZESTRIL) 10 MG tablet TAKE 1 TABLET BY MOUTH DAILY.   metoprolol tartrate (LOPRESSOR) 25 MG tablet TAKE 0.5 TABLET BY MOUTH DAILY.   rosuvastatin (CRESTOR) 10 MG tablet Take 1 tablet (10 mg total) by mouth daily.   SYRINGE-NEEDLE, DISP, 3 ML 25G X 1" 3 ML MISC Dose and route does not apply.  To be used to inject 1013mcg/mL Cyanocobalamin IM once every 30 days   No facility-administered encounter medications on file as of 02/25/2022.     Lab Results  Component Value Date   WBC 5.2 07/01/2021   HGB 12.6 07/01/2021   HCT 36.8 07/01/2021   PLT 258.0 07/01/2021   GLUCOSE 87 01/01/2022   CHOL 169 01/01/2022   TRIG 58.0 01/01/2022   HDL 71.50 01/01/2022   LDLCALC 86 01/01/2022   ALT 12 01/01/2022   AST 14 01/01/2022   NA 136 01/01/2022   K 4.5 01/01/2022   CL 104 01/01/2022   CREATININE 0.92 01/01/2022   BUN 21 01/01/2022   CO2 25 01/01/2022   TSH 0.60 01/01/2022   HGBA1C 5.5 06/21/2019    DG Bone Density  Result Date: 02/20/2022 EXAM: DUAL X-RAY ABSORPTIOMETRY (DXA) FOR BONE MINERAL DENSITY IMPRESSION: Your patient Wendy Nichols completed a BMD test on 02/20/2022 using the [employer] DXA System (software version: 14.10) manufactured by [employer]. The following summarizes the results of our evaluation. Technologist: MTB PATIENT BIOGRAPHICAL: Name: Wendy Nichols, Wendy Nichols Patient ID: [id number] Birth Date: 1951-01-06 Height: 64.0 in. Gender: Female Exam Date: 02/20/2022 Weight: 138.0 lbs. Indications: Caucasian, Hypothyroid, Hysterectomy, Postmenopausal Fractures: Right forearm Treatments: Calcium, Levothyroxine, Vitamin D DENSITOMETRY RESULTS: Site         Region     Measured Date Measured Age WHO Classification Young Adult T-score BMD         %Change  vs. Previous Significant Change (*) AP Spine L1-L3 02/20/2022 70.6 Osteopenia -1.4 1.011 g/cm2 -2.5% - AP Spine L1-L3 11/19/2016 65.4 Osteopenia -1.2 1.037 g/cm2 - - DualFemur Neck Right 02/20/2022 70.6 Osteopenia -2.2 0.733 g/cm2 0.5% - DualFemur Neck Right 11/19/2016 65.4 Osteopenia -2.2 0.729 g/cm2 - - DualFemur Total Mean 02/20/2022 70.6 Osteopenia -2.0 0.762 g/cm2 -1.2% - DualFemur Total Mean 11/19/2016 65.4 Osteopenia -1.9 0.771 g/cm2 - - Left Forearm Radius 33% 02/20/2022 70.6 Osteoporosis -3.8 0.542 g/cm2 - - ASSESSMENT: The BMD measured at Forearm Radius 33% is 0.542 g/cm2 with a T-score of -3.8. This patient is considered osteoporotic according to World Health Organization [facility]) criteria. The scan quality is good. L-4 was excluded due to degenerative changes. Compared with prior study, there has been no significant change in the spine. Compared with prior study, there has been no significant change in the total hip. World Science writer [facility]) criteria for post-menopausal, Caucasian Women: Normal:                   T-score at or above -1 SD Osteopenia/low bone mass: T-score between -1 and -2.5 SD Osteoporosis:             T-score at or below -2.5 SD RECOMMENDATIONS: 1. All patients should optimize calcium and vitamin D intake. 2. Consider FDA-approved medical therapies in postmenopausal women and men aged 81 years and older, based on the following: a. A hip or vertebral(clinical or morphometric) fracture b. T-score < -2.5 at the femoral neck or spine after appropriate evaluation to exclude secondary causes c. Low bone mass (T-score between -1.0 and -2.5 at the femoral neck or spine) and a 10-year probability  ZESTRIL) 10 MG tablet TAKE 1 TABLET BY MOUTH DAILY.   metoprolol tartrate (LOPRESSOR) 25 MG tablet TAKE 0.5 TABLET BY MOUTH DAILY.   rosuvastatin (CRESTOR) 10 MG tablet Take 1 tablet (10 mg total) by mouth daily.   SYRINGE-NEEDLE, DISP, 3 ML 25G X 1" 3 ML MISC Dose and route does not apply.  To be used to inject 1013mcg/mL Cyanocobalamin IM once every 30 days   No facility-administered encounter medications on file as of 02/25/2022.     Lab Results  Component Value Date   WBC 5.2 07/01/2021   HGB 12.6 07/01/2021   HCT 36.8 07/01/2021   PLT 258.0 07/01/2021   GLUCOSE 87 01/01/2022   CHOL 169 01/01/2022   TRIG 58.0 01/01/2022   HDL 71.50 01/01/2022   LDLCALC 86 01/01/2022   ALT 12 01/01/2022   AST 14 01/01/2022   NA 136 01/01/2022   K 4.5 01/01/2022   CL 104 01/01/2022   CREATININE 0.92 01/01/2022   BUN 21 01/01/2022   CO2 25 01/01/2022   TSH 0.60 01/01/2022   HGBA1C 5.5 06/21/2019    DG Bone Density  Result Date: 02/20/2022 EXAM: DUAL X-RAY ABSORPTIOMETRY (DXA) FOR BONE MINERAL DENSITY IMPRESSION: Your patient Wendy Nichols completed a BMD test on 02/20/2022 using the [employer] DXA System (software version: 14.10) manufactured by [employer]. The following summarizes the results of our evaluation. Technologist: MTB PATIENT BIOGRAPHICAL: Name: Wendy Nichols, Wendy Nichols Patient ID: [id number] Birth Date: 1951-01-06 Height: 64.0 in. Gender: Female Exam Date: 02/20/2022 Weight: 138.0 lbs. Indications: Caucasian, Hypothyroid, Hysterectomy, Postmenopausal Fractures: Right forearm Treatments: Calcium, Levothyroxine, Vitamin D DENSITOMETRY RESULTS: Site         Region     Measured Date Measured Age WHO Classification Young Adult T-score BMD         %Change  vs. Previous Significant Change (*) AP Spine L1-L3 02/20/2022 70.6 Osteopenia -1.4 1.011 g/cm2 -2.5% - AP Spine L1-L3 11/19/2016 65.4 Osteopenia -1.2 1.037 g/cm2 - - DualFemur Neck Right 02/20/2022 70.6 Osteopenia -2.2 0.733 g/cm2 0.5% - DualFemur Neck Right 11/19/2016 65.4 Osteopenia -2.2 0.729 g/cm2 - - DualFemur Total Mean 02/20/2022 70.6 Osteopenia -2.0 0.762 g/cm2 -1.2% - DualFemur Total Mean 11/19/2016 65.4 Osteopenia -1.9 0.771 g/cm2 - - Left Forearm Radius 33% 02/20/2022 70.6 Osteoporosis -3.8 0.542 g/cm2 - - ASSESSMENT: The BMD measured at Forearm Radius 33% is 0.542 g/cm2 with a T-score of -3.8. This patient is considered osteoporotic according to World Health Organization [facility]) criteria. The scan quality is good. L-4 was excluded due to degenerative changes. Compared with prior study, there has been no significant change in the spine. Compared with prior study, there has been no significant change in the total hip. World Science writer [facility]) criteria for post-menopausal, Caucasian Women: Normal:                   T-score at or above -1 SD Osteopenia/low bone mass: T-score between -1 and -2.5 SD Osteoporosis:             T-score at or below -2.5 SD RECOMMENDATIONS: 1. All patients should optimize calcium and vitamin D intake. 2. Consider FDA-approved medical therapies in postmenopausal women and men aged 81 years and older, based on the following: a. A hip or vertebral(clinical or morphometric) fracture b. T-score < -2.5 at the femoral neck or spine after appropriate evaluation to exclude secondary causes c. Low bone mass (T-score between -1.0 and -2.5 at the femoral neck or spine) and a 10-year probability  Patient ID: Wendy Nichols, female   DOB: January 30, 1951, 71 y.o.   MRN: [id number]   Subjective:    Patient ID: Wendy Nichols, female    DOB: 10/29/1950, 71 y.o.   MRN: [id number]   Patient here for  Chief Complaint  Patient presents with   dexa   .   HPI Here to discuss recent bone density results and treatment options.  Discussed results - osteoporosis - -3.2 - left radius, -2.2 right femur neck and -2.0 - total femur.  Discussed calcium, vitamin D and weight bearing exercise.  Discussed prescription medication, including oral bisphosphonates, reclast and prolia. Discussed possible side effects of medication and proper way to take medication.     Past Medical History:  Diagnosis Date   GERD (gastroesophageal reflux disease)    History of colon polyps    Hypercholesterolemia    Hypertension    Hypothyroidism    Osteoporosis    Pernicious anemia    Past Surgical History:  Procedure Laterality Date   ABDOMINAL HYSTERECTOMY  1997   abnormal uterine bleeding.    BREAST BIOPSY     biopsies in both breasts   DILATION AND CURETTAGE OF UTERUS  1979   Family History  Problem Relation Age of Onset   Breast cancer Mother    Kidney disease Mother    Hypertension Mother    Heart disease Mother    Heart disease Father        myocardial infarction   Breast cancer Maternal Aunt    Breast cancer Maternal Grandmother    Heart disease Brother        myocardial infarction   Brain cancer Brother    Social History   Socioeconomic History   Marital status: Married    Spouse name: Not on file   Number of children: 3   Years of education: Not on file   Highest education level: Not on file  Occupational History    Employer: [employer]  Tobacco Use   Smoking status: Never   Smokeless tobacco: Never  Substance and Sexual Activity   Alcohol use: Yes    Alcohol/week: 0.0 standard drinks of alcohol    Comment: occasional wine   Drug use: No   Sexual activity: Not on file  Other  Topics Concern   Not on file  Social History Narrative   Not on file   Social Determinants of Health   Financial Resource Strain: Low Risk  (06/18/2021)   Overall Financial Resource Strain (CARDIA)    Difficulty of Paying Living Expenses: Not hard at all  Food Insecurity: No Food Insecurity (06/18/2021)   Hunger Vital Sign    Worried About Running Out of Food in the Last Year: Never true    Ran Out of Food in the Last Year: Never true  Transportation Needs: No Transportation Needs (06/18/2021)   PRAPARE - Administrator, Civil Service (Medical): No    Lack of Transportation (Non-Medical): No  Physical Activity: Sufficiently Active (06/18/2021)   Exercise Vital Sign    Days of Exercise per Week: 4 days    Minutes of Exercise per Session: 60 min  Stress: No Stress Concern Present (06/18/2021)   [employer] of Occupational Health - Occupational Stress Questionnaire    Feeling of Stress : Not at all  Social Connections: Unknown (06/18/2021)   Social Connection and Isolation Panel [NHANES]    Frequency of Communication with Friends and Family: More than three times a week  Patient ID: Wendy Nichols, female   DOB: January 30, 1951, 71 y.o.   MRN: [id number]   Subjective:    Patient ID: Wendy Nichols, female    DOB: 10/29/1950, 71 y.o.   MRN: [id number]   Patient here for  Chief Complaint  Patient presents with   dexa   .   HPI Here to discuss recent bone density results and treatment options.  Discussed results - osteoporosis - -3.2 - left radius, -2.2 right femur neck and -2.0 - total femur.  Discussed calcium, vitamin D and weight bearing exercise.  Discussed prescription medication, including oral bisphosphonates, reclast and prolia. Discussed possible side effects of medication and proper way to take medication.     Past Medical History:  Diagnosis Date   GERD (gastroesophageal reflux disease)    History of colon polyps    Hypercholesterolemia    Hypertension    Hypothyroidism    Osteoporosis    Pernicious anemia    Past Surgical History:  Procedure Laterality Date   ABDOMINAL HYSTERECTOMY  1997   abnormal uterine bleeding.    BREAST BIOPSY     biopsies in both breasts   DILATION AND CURETTAGE OF UTERUS  1979   Family History  Problem Relation Age of Onset   Breast cancer Mother    Kidney disease Mother    Hypertension Mother    Heart disease Mother    Heart disease Father        myocardial infarction   Breast cancer Maternal Aunt    Breast cancer Maternal Grandmother    Heart disease Brother        myocardial infarction   Brain cancer Brother    Social History   Socioeconomic History   Marital status: Married    Spouse name: Not on file   Number of children: 3   Years of education: Not on file   Highest education level: Not on file  Occupational History    Employer: [employer]  Tobacco Use   Smoking status: Never   Smokeless tobacco: Never  Substance and Sexual Activity   Alcohol use: Yes    Alcohol/week: 0.0 standard drinks of alcohol    Comment: occasional wine   Drug use: No   Sexual activity: Not on file  Other  Topics Concern   Not on file  Social History Narrative   Not on file   Social Determinants of Health   Financial Resource Strain: Low Risk  (06/18/2021)   Overall Financial Resource Strain (CARDIA)    Difficulty of Paying Living Expenses: Not hard at all  Food Insecurity: No Food Insecurity (06/18/2021)   Hunger Vital Sign    Worried About Running Out of Food in the Last Year: Never true    Ran Out of Food in the Last Year: Never true  Transportation Needs: No Transportation Needs (06/18/2021)   PRAPARE - Administrator, Civil Service (Medical): No    Lack of Transportation (Non-Medical): No  Physical Activity: Sufficiently Active (06/18/2021)   Exercise Vital Sign    Days of Exercise per Week: 4 days    Minutes of Exercise per Session: 60 min  Stress: No Stress Concern Present (06/18/2021)   [employer] of Occupational Health - Occupational Stress Questionnaire    Feeling of Stress : Not at all  Social Connections: Unknown (06/18/2021)   Social Connection and Isolation Panel [NHANES]    Frequency of Communication with Friends and Family: More than three times a week

## 2022-03-09 ENCOUNTER — Encounter: Payer: Self-pay | Admitting: Internal Medicine

## 2022-03-09 NOTE — Assessment & Plan Note (Signed)
Discussed vitamin D supplements.

## 2022-03-09 NOTE — Assessment & Plan Note (Signed)
Overall appears to be handling things well.  Follow.  ?

## 2022-03-09 NOTE — Assessment & Plan Note (Signed)
Here to discuss recent bone density results and treatment options.  Discussed results - osteoporosis - -3.2 - left radius, -2.2 right femur neck and -2.0 - total femur.  Discussed calcium, vitamin D and weight bearing exercise.  Discussed prescription medication, including oral bisphosphonates, reclast and prolia. Discussed possible side effects of medication and proper way to take medication.  She prefers reclast.  Refer for infusion.

## 2022-03-09 NOTE — Assessment & Plan Note (Addendum)
Blood pressure has been under good control.  Continue lisinopril.  Follow pressures.  Follow metabolic panel.  

## 2022-03-18 ENCOUNTER — Other Ambulatory Visit: Payer: Self-pay | Admitting: Internal Medicine

## 2022-03-18 DIAGNOSIS — E7800 Pure hypercholesterolemia, unspecified: Secondary | ICD-10-CM

## 2022-05-12 ENCOUNTER — Telehealth: Payer: Self-pay | Admitting: Internal Medicine

## 2022-05-12 NOTE — Telephone Encounter (Signed)
Pt called in staying that she started getting sick Saturday with sneezing, cold, sore throat, headache, congested, etc, so Sunday she tested positive for Covid, and she just wanted to know what can she takes besides Tylenol?? Or theres anything else she can do for now?? She's available @[phone]

## 2022-05-12 NOTE — Telephone Encounter (Signed)
Offered appt. Patient declined. Would prefer to monitor sx at home and let us know if she does not continue to improve.

## 2022-05-12 NOTE — Telephone Encounter (Signed)
COVID POSITIVE  Started Friday night Tested positive Sunday Symptoms are headache, sore throat, congestion, sneezing, cough. She was running a low grade fever (99) but so far no temp today.  Felt like worst day was Saturday night.  She is using tylenol and throat lozenges to treat the symptoms.  Confirmed she is eating and drinking ok. No chest congestion, sob, chills, body aches, etc.  She is wondering if there is something else that she should be doing or taking?

## 2022-05-12 NOTE — Telephone Encounter (Signed)
Reviewed.  Appt?

## 2022-05-13 ENCOUNTER — Encounter: Payer: Self-pay | Admitting: Family

## 2022-05-13 ENCOUNTER — Ambulatory Visit (INDEPENDENT_AMBULATORY_CARE_PROVIDER_SITE_OTHER): Payer: Medicare Other | Admitting: Family

## 2022-05-13 VITALS — BP 118/78 | HR 61 | Temp 97.7°F | Ht 64.0 in | Wt 137.6 lb

## 2022-05-13 DIAGNOSIS — M545 Low back pain, unspecified: Secondary | ICD-10-CM | POA: Diagnosis not present

## 2022-05-13 DIAGNOSIS — U071 COVID-19: Secondary | ICD-10-CM

## 2022-05-13 MED ORDER — NIRMATRELVIR/RITONAVIR (PAXLOVID)TABLET: 3.0000 | ORAL_TABLET | Freq: Two times a day (BID) | ORAL | 0 refills | Status: AC

## 2022-05-13 NOTE — Assessment & Plan Note (Signed)
Patient nontoxic in appearance.  No respiratory distress. Counseled on lacking long term safely and effectiveness data of medication, Paxlovid. Explained EUA for Paxlovid. Criteria met for consideration of Paxlovid,  patient older than 12 years and weight > 40kg, started within 5 days of symptom onset and risk factor for severe disease include: age > 56, HTN   GFR > 60.  Counseled on adverse effects including altered taste, diarrhea, HTN, and myalgia.  Patient is most comfortable and desires to start Paxlovid and understands to call me with concerns or new symptoms.

## 2022-05-13 NOTE — Patient Instructions (Addendum)
   Start Paxlovid.  You may use Delsym over-the-counter for cough if needed.  You may use plain Mucinex for congestion. Please suspend use of alprazolam and rosuvastatin while on Paxlovid due to drug interactions as we discussed  If you test positive for COVID-19, stay home for at least 5 days and isolate from others in your home. You are likely most infectious during these first 5 days. Wear a high-quality mask if you must be around others at home and in public. Do not go places where you are unable to wear a mask.  We discussed starting Paxlovid which is an unapproved drug that is authorized for use under an Emergency Use Authorization.  There are no adequate, approved, available products for the treatment of COVID-19 in adults who have mild-to-moderate COVID-19 and are at high risk for progressing to severe COVID-19, including hospitalization or death.  There are benefits and risks of taking this treatment as outlined in the "Fact Sheet for Patients and Caregivers." You may find this document here and please read in detail   [email or web address]   I have sent Paxlovid to your pharmacy. Please call pharmacy so they bring medication out to your car and you do not have to go inside.   PAXLOVID ADMINISTRATION INSTRUCTIONS:  Take with or without food. Swallow the tablets whole. Don't chew, crush, or break the medications because it might not work as well  For each dose of the medication, you should be taking 3 tablets together (2 pink oval and 1 white oval) TWICE a day for FIVE days   Finish your full five-day course of Paxlovid even if you feel better before you're done. Stopping this medication too early can make it less effective to prevent severe illness related to [place].    Paxlovid is prescribed for YOU ONLY. Don't share it with others, even if they have similar symptoms as you. This medication might not be right for everyone.  Make sure to take steps to protect  yourself and others while you're taking this medication in order to get well soon and to prevent others from getting sick with COVID-19.  Paxlovid (nirmatrelvir / ritonavir) can cause hormonal birth control medications to not work well. If you or your partner is currently taking hormonal birth control, use condoms or other birth control methods to prevent unintended pregnancies.   COMMON SIDE EFFECTS: Altered or bad taste in your mouth  Diarrhea  High blood pressure (1% of people) Muscle aches (1% of people)    If your COVID-19 symptoms get worse, get medical help right away. Call 911 if you experience symptoms such as worsening cough, trouble breathing, chest pain that doesn't go away, confusion, a hard time staying awake, and pale or blue-colored skin.This medication won't prevent all COVID-19 cases from getting worse.

## 2022-05-13 NOTE — Progress Notes (Signed)
Assessment & Plan:  COVID-19 Assessment & Plan: Patient nontoxic in appearance.  No respiratory distress. Counseled on lacking long term safely and effectiveness data of medication, Paxlovid. Explained EUA for Paxlovid. Criteria met for consideration of Paxlovid,  patient older than 12 years and weight > 40kg, started within 5 days of symptom onset and risk factor for severe disease include: age > 79, HTN   GFR > 60.  Counseled on adverse effects including altered taste, diarrhea, HTN, and myalgia.  Patient is most comfortable and desires to start Paxlovid and understands to call me with concerns or new symptoms.    Orders: -     nirmatrelvir/ritonavir; Take 3 tablets by mouth 2 (two) times daily for 5 days. (Take nirmatrelvir 150 mg two tablets twice daily for 5 days and ritonavir 100 mg one tablet twice daily for 5 days) Patient GFR is 63  Dispense: 30 tablet; Refill: 0  Acute left-sided low back pain without sciatica Assessment & Plan: No radicular symptoms.  Question if related to body aches and COVID at this time.  Advised patient Tylenol as appropriate, heat.  Patient will closely monitor and let me know if back pain persists or she develops other symptoms.      Return precautions given.   Risks, benefits, and alternatives of the medications and treatment plan prescribed today were discussed, and patient expressed understanding.   Education regarding symptom management and diagnosis given to patient on AVS either electronically or printed.  No follow-ups on file.  Rennie Plowman, FNP  Subjective:    Patient ID: Wendy Nichols, female    DOB: [birth year], 72 y.o.   MRN: [id number]  CC: Wendy Nichols is a 72 y.o. female who presents today for an acute visit.    HPI: Complains of congestion, bodyaches x 4 days, unchanged.   Endorses fever 3 days ago. Tmax 99, fatigue.   Occasional cough, left sided low back ache  Denies shortness of breath, wheezing, dysuria,  numbness, saddle anesthesia.  She been taking Tylenol with some relief  Covid positive at home  She had primary series for COVID and 1 booster.  She has never had COVID   History of hypertension, hypothyroidism, osteoporosis, HLD  No h/o renal stone  She uses xanax 0.25mg  prn.  GFR 63  Never smoker   Allergies: Codeine and Sulfa antibiotics Current Outpatient Medications on File Prior to Visit  Medication Sig Dispense Refill   ALPRAZolam (XANAX) 0.25 MG tablet TAKE 1 TABLET BY MOUTH DAILY AS NEEDED FOR ANXIETY 30 tablet 0   aspirin 81 MG tablet Take 81 mg by mouth every other day.     cholecalciferol (VITAMIN D) 25 MCG (1000 UT) tablet Take 1,000 Units by mouth daily.     cyanocobalamin (,VITAMIN B-12,) 1000 MCG/ML injection INJECT INTO THE MUSCLE ONCE WEEKLY FOR 21 DAYS, THEN EVERY 30 DAYS AS DIRECTED. 3 mL 3   levothyroxine (SYNTHROID) 100 MCG tablet TAKE 1 TABLET BY MOUTH DAILY. 90 tablet 3   lisinopril (ZESTRIL) 10 MG tablet TAKE 1 TABLET BY MOUTH DAILY. 90 tablet 3   metoprolol tartrate (LOPRESSOR) 25 MG tablet TAKE 0.5 TABLET BY MOUTH DAILY. 45 tablet 1   rosuvastatin (CRESTOR) 10 MG tablet TAKE 1 TABLET BY MOUTH DAILY. 90 tablet 3   SYRINGE-NEEDLE, DISP, 3 ML 25G X 1" 3 ML MISC Dose and route does not apply.  To be used to inject 1035mcg/mL Cyanocobalamin IM once every 30 days 50 each 0  No current facility-administered medications on file prior to visit.    Review of Systems  Constitutional:  Positive for fatigue. Negative for chills and fever.  HENT:  Positive for congestion. Negative for ear pain and sore throat.   Respiratory:  Positive for cough. Negative for shortness of breath.   Cardiovascular:  Negative for chest pain and palpitations.  Gastrointestinal:  Negative for nausea and vomiting.  Genitourinary:  Negative for dysuria.  Musculoskeletal:  Positive for arthralgias.  Neurological:  Negative for numbness.      Objective:    BP 118/78    Pulse 61   Temp 97.7 F (36.5 C) (Oral)   Ht 5\' 4"  (1.626 m)   Wt 137 lb 9.6 oz (62.4 kg)   LMP 03/25/1996   SpO2 99%   BMI 23.62 kg/m   BP Readings from Last 3 Encounters:  05/13/22 118/78  02/25/22 130/80  01/01/22 122/68   Wt Readings from Last 3 Encounters:  05/13/22 137 lb 9.6 oz (62.4 kg)  02/25/22 139 lb (63 kg)  01/01/22 135 lb 12.8 oz (61.6 kg)    Physical Exam Vitals reviewed.  Constitutional:      Appearance: She is well-developed.  HENT:     Head: Normocephalic and atraumatic.     Right Ear: Hearing, tympanic membrane, ear canal and external ear normal. No decreased hearing noted. No drainage, swelling or tenderness. No middle ear effusion. No foreign body. Tympanic membrane is not erythematous or bulging.     Left Ear: Hearing, tympanic membrane, ear canal and external ear normal. No decreased hearing noted. No drainage, swelling or tenderness.  No middle ear effusion. No foreign body. Tympanic membrane is not erythematous or bulging.     Nose: Nose normal. No rhinorrhea.     Right Sinus: No maxillary sinus tenderness or frontal sinus tenderness.     Left Sinus: No maxillary sinus tenderness or frontal sinus tenderness.     Mouth/Throat:     Pharynx: Uvula midline. No oropharyngeal exudate or posterior oropharyngeal erythema.     Tonsils: No tonsillar abscesses.  Eyes:     Conjunctiva/sclera: Conjunctivae normal.  Cardiovascular:     Rate and Rhythm: Regular rhythm.     Pulses: Normal pulses.     Heart sounds: Normal heart sounds.  Pulmonary:     Effort: Pulmonary effort is normal.     Breath sounds: Normal breath sounds. No wheezing, rhonchi or rales.  Musculoskeletal:     Lumbar back: No spasms, tenderness or bony tenderness. Normal range of motion.     Comments: No rash present. No pain with palpation, forward flexion.   Lymphadenopathy:     Head:     Right side of head: No submental, submandibular, tonsillar, preauricular, posterior auricular or  occipital adenopathy.     Left side of head: No submental, submandibular, tonsillar, preauricular, posterior auricular or occipital adenopathy.     Cervical: No cervical adenopathy.  Skin:    General: Skin is warm and dry.  Neurological:     Mental Status: She is alert.  Psychiatric:        Speech: Speech normal.        Behavior: Behavior normal.        Thought Content: Thought content normal.

## 2022-05-13 NOTE — Assessment & Plan Note (Signed)
No radicular symptoms.  Question if related to body aches and COVID at this time.  Advised patient Tylenol as appropriate, heat.  Patient will closely monitor and let me know if back pain persists or she develops other symptoms.

## 2022-05-16 ENCOUNTER — Ambulatory Visit: Payer: Medicare Other | Admitting: Internal Medicine

## 2022-07-02 ENCOUNTER — Ambulatory Visit (INDEPENDENT_AMBULATORY_CARE_PROVIDER_SITE_OTHER): Payer: Medicare Other | Admitting: Internal Medicine

## 2022-07-02 ENCOUNTER — Encounter: Payer: Self-pay | Admitting: Internal Medicine

## 2022-07-02 VITALS — BP 118/72 | HR 60 | Temp 98.3°F | Resp 16 | Ht 64.0 in | Wt 135.8 lb

## 2022-07-02 DIAGNOSIS — F439 Reaction to severe stress, unspecified: Secondary | ICD-10-CM

## 2022-07-02 DIAGNOSIS — Z86010 Personal history of colonic polyps: Secondary | ICD-10-CM

## 2022-07-02 DIAGNOSIS — E039 Hypothyroidism, unspecified: Secondary | ICD-10-CM

## 2022-07-02 DIAGNOSIS — E7800 Pure hypercholesterolemia, unspecified: Secondary | ICD-10-CM | POA: Diagnosis not present

## 2022-07-02 DIAGNOSIS — I10 Essential (primary) hypertension: Secondary | ICD-10-CM | POA: Diagnosis not present

## 2022-07-02 DIAGNOSIS — M810 Age-related osteoporosis without current pathological fracture: Secondary | ICD-10-CM | POA: Diagnosis not present

## 2022-07-02 LAB — LIPID PANEL
Cholesterol: 169 mg/dL (ref 0–200)
HDL: 76 mg/dL (ref >39.00)
LDL Cholesterol: 81 mg/dL (ref 0–99)
NonHDL: 93.39
Total CHOL/HDL Ratio: 2
Triglycerides: 62 mg/dL (ref 0.0–149.0)
VLDL: 12.4 mg/dL (ref 0.0–40.0)

## 2022-07-02 LAB — HEPATIC FUNCTION PANEL
ALT: 12 U/L (ref 0–35)
AST: 14 U/L (ref 0–37)
Albumin: 4.2 g/dL (ref 3.5–5.2)
Alkaline Phosphatase: 45 U/L (ref 39–117)
Bilirubin, Direct: 0.1 mg/dL (ref 0.0–0.3)
Total Bilirubin: 0.6 mg/dL (ref 0.2–1.2)
Total Protein: 6.7 g/dL (ref 6.0–8.3)

## 2022-07-02 LAB — BASIC METABOLIC PANEL
BUN: 17 mg/dL (ref 6–23)
CO2: 26 mEq/L (ref 19–32)
Calcium: 9.3 mg/dL (ref 8.4–10.5)
Chloride: 103 mEq/L (ref 96–112)
Creatinine, Ser: 0.9 mg/dL (ref 0.40–1.20)
GFR: 64.53 mL/min (ref >60.00)
Glucose, Bld: 91 mg/dL (ref 70–99)
Potassium: 4.8 mEq/L (ref 3.5–5.1)
Sodium: 135 mEq/L (ref 135–145)

## 2022-07-02 LAB — TSH: TSH: 0.83 u[IU]/mL (ref 0.35–5.50)

## 2022-07-02 LAB — CBC WITH DIFFERENTIAL/PLATELET
Basophils Absolute: 0 10*3/uL (ref 0.0–0.1)
Basophils Relative: 0.3 % (ref 0.0–3.0)
Eosinophils Absolute: 0 10*3/uL (ref 0.0–0.7)
Eosinophils Relative: 1.1 % (ref 0.0–5.0)
HCT: 37.7 % (ref 36.0–46.0)
Hemoglobin: 12.7 g/dL (ref 12.0–15.0)
Lymphocytes Relative: 20.6 % (ref 12.0–46.0)
Lymphs Abs: 0.9 10*3/uL (ref 0.7–4.0)
MCHC: 33.7 g/dL (ref 30.0–36.0)
MCV: 90.4 fl (ref 78.0–100.0)
Monocytes Absolute: 0.4 10*3/uL (ref 0.1–1.0)
Monocytes Relative: 9.2 % (ref 3.0–12.0)
Neutro Abs: 3 10*3/uL (ref 1.4–7.7)
Neutrophils Relative %: 68.8 % (ref 43.0–77.0)
Platelets: 268 10*3/uL (ref 150.0–400.0)
RBC: 4.17 Mil/uL (ref 3.87–5.11)
RDW: 13.2 % (ref 11.5–15.5)
WBC: 4.4 10*3/uL (ref 4.0–10.5)

## 2022-07-02 LAB — URINALYSIS, ROUTINE W REFLEX MICROSCOPIC
Bilirubin Urine: NEGATIVE
Hgb urine dipstick: NEGATIVE
Ketones, ur: NEGATIVE
Leukocytes,Ua: NEGATIVE
Nitrite: NEGATIVE
RBC / HPF: NONE SEEN (ref 0–?)
Specific Gravity, Urine: 1.025 (ref 1.000–1.030)
Total Protein, Urine: NEGATIVE
Urine Glucose: NEGATIVE
Urobilinogen, UA: 0.2 (ref 0.0–1.0)
WBC, UA: NONE SEEN (ref 0–?)
pH: 6 (ref 5.0–8.0)

## 2022-07-02 LAB — VITAMIN D 25 HYDROXY (VIT D DEFICIENCY, FRACTURES): VITD: 42.09 ng/mL (ref 30.00–100.00)

## 2022-07-02 MED ORDER — ROSUVASTATIN CALCIUM 10 MG PO TABS: 10.0000 mg | ORAL_TABLET | Freq: Every day | ORAL | 3 refills | Status: DC

## 2022-07-02 MED ORDER — METOPROLOL TARTRATE 25 MG PO TABS: ORAL_TABLET | ORAL | 3 refills | Status: DC

## 2022-07-02 MED ORDER — LISINOPRIL 10 MG PO TABS: 10.0000 mg | ORAL_TABLET | Freq: Every day | ORAL | 3 refills | Status: DC

## 2022-07-02 MED ORDER — "SYRINGE/NEEDLE (DISP) 25G X 1"" 3 ML MISC": 0 refills | Status: AC

## 2022-07-02 MED ORDER — CYANOCOBALAMIN 1000 MCG/ML IJ SOLN: INTRAMUSCULAR | 0 refills | Status: DC

## 2022-07-02 NOTE — Progress Notes (Signed)
Subjective:    Patient ID: Wendy Nichols, female    DOB: 1951/03/17, 72 y.o.   MRN: [id number]  Patient here for  Chief Complaint  Patient presents with   Medical Management of Chronic Issues    HPI Here to follow up regarding hypercholesterolemia, hypertension and hypothyroidism.  Recent bone density - osteoporosis.  Discussed treatment last visit.  Was referred for reclast.  Appt has not been scheduled.  Discussed scheduling here - agreeable.  No chest pain or sob reported.  No abdominal pain or bowel change.    Past Medical History:  Diagnosis Date   GERD (gastroesophageal reflux disease)    History of colon polyps    Hypercholesterolemia    Hypertension    Hypothyroidism    Osteoporosis    Pernicious anemia    Past Surgical History:  Procedure Laterality Date   ABDOMINAL HYSTERECTOMY  1997   abnormal uterine bleeding.    BREAST BIOPSY     biopsies in both breasts   DILATION AND CURETTAGE OF UTERUS  1979   Family History  Problem Relation Age of Onset   Breast cancer Mother    Kidney disease Mother    Hypertension Mother    Heart disease Mother    Heart disease Father        myocardial infarction   Breast cancer Maternal Aunt    Breast cancer Maternal Grandmother    Heart disease Brother        myocardial infarction   Brain cancer Brother    Social History   Socioeconomic History   Marital status: Married    Spouse name: Not on file   Number of children: 3   Years of education: Not on file   Highest education level: Not on file  Occupational History    Employer: [employer]  Tobacco Use   Smoking status: Never   Smokeless tobacco: Never  Substance and Sexual Activity   Alcohol use: Yes    Alcohol/week: 0.0 standard drinks of alcohol    Comment: occasional wine   Drug use: No   Sexual activity: Not on file  Other Topics Concern   Not on file  Social History Narrative   Not on file   Social Determinants of Health   Financial Resource Strain:  Low Risk  (06/18/2021)   Overall Financial Resource Strain (CARDIA)    Difficulty of Paying Living Expenses: Not hard at all  Food Insecurity: No Food Insecurity (06/18/2021)   Hunger Vital Sign    Worried About Running Out of Food in the Last Year: Never true    Ran Out of Food in the Last Year: Never true  Transportation Needs: No Transportation Needs (06/18/2021)   PRAPARE - Hydrologist (Medical): No    Lack of Transportation (Non-Medical): No  Physical Activity: Sufficiently Active (06/18/2021)   Exercise Vital Sign    Days of Exercise per Week: 4 days    Minutes of Exercise per Session: 60 min  Stress: No Stress Concern Present (06/18/2021)   [place]    Feeling of Stress : Not at all  Social Connections: Unknown (06/18/2021)   Social Connection and Isolation Panel [NHANES]    Frequency of Communication with Friends and Family: More than three times a week    Frequency of Social Gatherings with Friends and Family: More than three times a week    Attends Religious Services: Not on file  Subjective:    Patient ID: Wendy Nichols, female    DOB: 1951/03/17, 72 y.o.   MRN: [id number]  Patient here for  Chief Complaint  Patient presents with   Medical Management of Chronic Issues    HPI Here to follow up regarding hypercholesterolemia, hypertension and hypothyroidism.  Recent bone density - osteoporosis.  Discussed treatment last visit.  Was referred for reclast.  Appt has not been scheduled.  Discussed scheduling here - agreeable.  No chest pain or sob reported.  No abdominal pain or bowel change.    Past Medical History:  Diagnosis Date   GERD (gastroesophageal reflux disease)    History of colon polyps    Hypercholesterolemia    Hypertension    Hypothyroidism    Osteoporosis    Pernicious anemia    Past Surgical History:  Procedure Laterality Date   ABDOMINAL HYSTERECTOMY  1997   abnormal uterine bleeding.    BREAST BIOPSY     biopsies in both breasts   DILATION AND CURETTAGE OF UTERUS  1979   Family History  Problem Relation Age of Onset   Breast cancer Mother    Kidney disease Mother    Hypertension Mother    Heart disease Mother    Heart disease Father        myocardial infarction   Breast cancer Maternal Aunt    Breast cancer Maternal Grandmother    Heart disease Brother        myocardial infarction   Brain cancer Brother    Social History   Socioeconomic History   Marital status: Married    Spouse name: Not on file   Number of children: 3   Years of education: Not on file   Highest education level: Not on file  Occupational History    Employer: [employer]  Tobacco Use   Smoking status: Never   Smokeless tobacco: Never  Substance and Sexual Activity   Alcohol use: Yes    Alcohol/week: 0.0 standard drinks of alcohol    Comment: occasional wine   Drug use: No   Sexual activity: Not on file  Other Topics Concern   Not on file  Social History Narrative   Not on file   Social Determinants of Health   Financial Resource Strain:  Low Risk  (06/18/2021)   Overall Financial Resource Strain (CARDIA)    Difficulty of Paying Living Expenses: Not hard at all  Food Insecurity: No Food Insecurity (06/18/2021)   Hunger Vital Sign    Worried About Running Out of Food in the Last Year: Never true    Ran Out of Food in the Last Year: Never true  Transportation Needs: No Transportation Needs (06/18/2021)   PRAPARE - Hydrologist (Medical): No    Lack of Transportation (Non-Medical): No  Physical Activity: Sufficiently Active (06/18/2021)   Exercise Vital Sign    Days of Exercise per Week: 4 days    Minutes of Exercise per Session: 60 min  Stress: No Stress Concern Present (06/18/2021)   [place]    Feeling of Stress : Not at all  Social Connections: Unknown (06/18/2021)   Social Connection and Isolation Panel [NHANES]    Frequency of Communication with Friends and Family: More than three times a week    Frequency of Social Gatherings with Friends and Family: More than three times a week    Attends Religious Services: Not on file  MG tablet TAKE 1 TABLET BY MOUTH DAILY.   [DISCONTINUED] SYRINGE-NEEDLE, DISP, 3 ML 25G X 1" 3 ML MISC Dose and route does not apply.  To be used to inject 1028mcg/mL Cyanocobalamin IM once every 30 days   cyanocobalamin (VITAMIN B12) 1000 MCG/ML injection Inject 1 mL into the muscle once every 30 days.   lisinopril (ZESTRIL) 10 MG tablet Take 1 tablet (10 mg total) by mouth daily.   metoprolol tartrate (LOPRESSOR) 25 MG tablet TAKE 0.5 TABLET BY MOUTH DAILY.   rosuvastatin (CRESTOR) 10 MG tablet Take 1 tablet (10 mg total) by mouth daily.   SYRINGE-NEEDLE, DISP, 3 ML 25G X 1" 3 ML MISC Use as directed to administer b12 injections   No facility-administered encounter medications on file as of 07/02/2022.     Lab Results  Component Value Date   WBC 4.4 07/02/2022   HGB 12.7 07/02/2022   HCT 37.7 07/02/2022   PLT 268.0 07/02/2022   GLUCOSE 91 07/02/2022   CHOL 169 07/02/2022   TRIG 62.0 07/02/2022   HDL 76.00 07/02/2022   LDLCALC 81 07/02/2022   ALT 12 07/02/2022   AST 14 07/02/2022   NA 135 07/02/2022   K 4.8 07/02/2022   CL 103 07/02/2022   CREATININE 0.90 07/02/2022   BUN 17 07/02/2022   CO2 26 07/02/2022   TSH 0.83 07/02/2022   HGBA1C 5.5 06/21/2019    DG Bone Density  Result Date: 02/20/2022 EXAM: DUAL X-RAY ABSORPTIOMETRY (DXA) FOR BONE MINERAL DENSITY IMPRESSION: Your patient Lillymae Reusch completed a BMD test on 02/20/2022 using the [place] (software version: 14.10) manufactured by [employer]. The following summarizes the results of our evaluation. Technologist: MTB PATIENT BIOGRAPHICAL: Name: Kady, Odonohue Patient ID: [id number] Birth Date: 1950/05/04 Height: 64.0 in. Gender: Female Exam Date: 02/20/2022 Weight:  138.0 lbs. Indications: Caucasian, Hypothyroid, Hysterectomy, Postmenopausal Fractures: Right forearm Treatments: Calcium, Levothyroxine, Vitamin D DENSITOMETRY RESULTS: Site         Region     Measured Date Measured Age WHO Classification Young Adult T-score BMD         %Change vs. Previous Significant Change (*) AP Spine L1-L3 02/20/2022 70.6 Osteopenia -1.4 1.011 g/cm2 -2.5% - AP Spine L1-L3 11/19/2016 65.4 Osteopenia -1.2 1.037 g/cm2 - - DualFemur Neck Right 02/20/2022 70.6 Osteopenia -2.2 0.733 g/cm2 0.5% - DualFemur Neck Right 11/19/2016 65.4 Osteopenia -2.2 0.729 g/cm2 - - DualFemur Total Mean 02/20/2022 70.6 Osteopenia -2.0 0.762 g/cm2 -1.2% - DualFemur Total Mean 11/19/2016 65.4 Osteopenia -1.9 0.771 g/cm2 - - Left Forearm Radius 33% 02/20/2022 70.6 Osteoporosis -3.8 0.542 g/cm2 - - ASSESSMENT: The BMD measured at Forearm Radius 33% is 0.542 g/cm2 with a T-score of -3.8. This patient is considered osteoporotic according to [facility]) criteria. The scan quality is good. L-4 was excluded due to degenerative changes. Compared with prior study, there has been no significant change in the spine. Compared with prior study, there has been no significant change in the total hip. [facility]) criteria for post-menopausal, Caucasian Women: Normal:                   T-score at or above -1 SD Osteopenia/low bone mass: T-score between -1 and -2.5 SD Osteoporosis:             T-score at or below -2.5 SD RECOMMENDATIONS: 1. All patients should optimize calcium and vitamin D intake. 2. Consider FDA-approved medical therapies in postmenopausal women and men aged  Subjective:    Patient ID: Wendy Nichols, female    DOB: 1951/03/17, 72 y.o.   MRN: [id number]  Patient here for  Chief Complaint  Patient presents with   Medical Management of Chronic Issues    HPI Here to follow up regarding hypercholesterolemia, hypertension and hypothyroidism.  Recent bone density - osteoporosis.  Discussed treatment last visit.  Was referred for reclast.  Appt has not been scheduled.  Discussed scheduling here - agreeable.  No chest pain or sob reported.  No abdominal pain or bowel change.    Past Medical History:  Diagnosis Date   GERD (gastroesophageal reflux disease)    History of colon polyps    Hypercholesterolemia    Hypertension    Hypothyroidism    Osteoporosis    Pernicious anemia    Past Surgical History:  Procedure Laterality Date   ABDOMINAL HYSTERECTOMY  1997   abnormal uterine bleeding.    BREAST BIOPSY     biopsies in both breasts   DILATION AND CURETTAGE OF UTERUS  1979   Family History  Problem Relation Age of Onset   Breast cancer Mother    Kidney disease Mother    Hypertension Mother    Heart disease Mother    Heart disease Father        myocardial infarction   Breast cancer Maternal Aunt    Breast cancer Maternal Grandmother    Heart disease Brother        myocardial infarction   Brain cancer Brother    Social History   Socioeconomic History   Marital status: Married    Spouse name: Not on file   Number of children: 3   Years of education: Not on file   Highest education level: Not on file  Occupational History    Employer: [employer]  Tobacco Use   Smoking status: Never   Smokeless tobacco: Never  Substance and Sexual Activity   Alcohol use: Yes    Alcohol/week: 0.0 standard drinks of alcohol    Comment: occasional wine   Drug use: No   Sexual activity: Not on file  Other Topics Concern   Not on file  Social History Narrative   Not on file   Social Determinants of Health   Financial Resource Strain:  Low Risk  (06/18/2021)   Overall Financial Resource Strain (CARDIA)    Difficulty of Paying Living Expenses: Not hard at all  Food Insecurity: No Food Insecurity (06/18/2021)   Hunger Vital Sign    Worried About Running Out of Food in the Last Year: Never true    Ran Out of Food in the Last Year: Never true  Transportation Needs: No Transportation Needs (06/18/2021)   PRAPARE - Hydrologist (Medical): No    Lack of Transportation (Non-Medical): No  Physical Activity: Sufficiently Active (06/18/2021)   Exercise Vital Sign    Days of Exercise per Week: 4 days    Minutes of Exercise per Session: 60 min  Stress: No Stress Concern Present (06/18/2021)   [place]    Feeling of Stress : Not at all  Social Connections: Unknown (06/18/2021)   Social Connection and Isolation Panel [NHANES]    Frequency of Communication with Friends and Family: More than three times a week    Frequency of Social Gatherings with Friends and Family: More than three times a week    Attends Religious Services: Not on file

## 2022-07-13 ENCOUNTER — Encounter: Payer: Self-pay | Admitting: Internal Medicine

## 2022-07-13 NOTE — Assessment & Plan Note (Signed)
Saw Dr Toledo.  Colonoscopy 02/2018.  Recommended f/u colonoscopy 10 years.  

## 2022-07-13 NOTE — Assessment & Plan Note (Signed)
Blood pressure has been under good control.  Continue lisinopril.  Follow pressures.  Follow metabolic panel.  

## 2022-07-13 NOTE — Assessment & Plan Note (Signed)
Have discussed recent bone density results and treatment options.  Discussed results - osteoporosis - -3.2 - left radius, -2.2 right femur neck and -2.0 - total femur.  Discussed calcium, vitamin D and weight bearing exercise.  Discussed prescription medication, including oral bisphosphonates, reclast and prolia. Discussed possible side effects of medication and proper way to take medication.  She prefers reclast.  Referred for infusion.

## 2022-07-13 NOTE — Assessment & Plan Note (Signed)
Overall appears to be handling things well.  Follow.  ?

## 2022-07-13 NOTE — Assessment & Plan Note (Signed)
On thyroid replacement.  Follow tsh.  

## 2022-07-13 NOTE — Assessment & Plan Note (Signed)
On crestor - last labs.  Low cholesterol diet and exercise.  Follow lipid panel and liver function tests.

## 2022-09-29 DIAGNOSIS — K080 Exfoliation of teeth due to systemic causes: Secondary | ICD-10-CM | POA: Diagnosis not present

## 2022-11-10 DIAGNOSIS — Z1231 Encounter for screening mammogram for malignant neoplasm of breast: Secondary | ICD-10-CM | POA: Diagnosis not present

## 2022-11-10 DIAGNOSIS — Z803 Family history of malignant neoplasm of breast: Secondary | ICD-10-CM | POA: Diagnosis not present

## 2022-11-10 LAB — HM MAMMOGRAPHY

## 2022-11-27 ENCOUNTER — Encounter (INDEPENDENT_AMBULATORY_CARE_PROVIDER_SITE_OTHER): Payer: Self-pay

## 2023-01-05 ENCOUNTER — Other Ambulatory Visit (INDEPENDENT_AMBULATORY_CARE_PROVIDER_SITE_OTHER): Payer: Medicare Other

## 2023-01-05 DIAGNOSIS — I10 Essential (primary) hypertension: Secondary | ICD-10-CM | POA: Diagnosis not present

## 2023-01-05 DIAGNOSIS — E7800 Pure hypercholesterolemia, unspecified: Secondary | ICD-10-CM | POA: Diagnosis not present

## 2023-01-05 LAB — BASIC METABOLIC PANEL
BUN: 25 mg/dL — ABNORMAL HIGH (ref 6–23)
CO2: 24 mEq/L (ref 19–32)
Calcium: 9.1 mg/dL (ref 8.4–10.5)
Chloride: 109 mEq/L (ref 96–112)
Creatinine, Ser: 0.96 mg/dL (ref 0.40–1.20)
GFR: 59.51 mL/min — ABNORMAL LOW (ref >60.00)
Glucose, Bld: 96 mg/dL (ref 70–99)
Potassium: 4.8 mEq/L (ref 3.5–5.1)
Sodium: 139 mEq/L (ref 135–145)

## 2023-01-05 LAB — LIPID PANEL
Cholesterol: 156 mg/dL (ref 0–200)
HDL: 74.8 mg/dL (ref >39.00)
LDL Cholesterol: 72 mg/dL (ref 0–99)
NonHDL: 81.69
Total CHOL/HDL Ratio: 2
Triglycerides: 47 mg/dL (ref 0.0–149.0)
VLDL: 9.4 mg/dL (ref 0.0–40.0)

## 2023-01-05 LAB — HEPATIC FUNCTION PANEL
ALT: 14 U/L (ref 0–35)
AST: 18 U/L (ref 0–37)
Albumin: 4.1 g/dL (ref 3.5–5.2)
Alkaline Phosphatase: 37 U/L — ABNORMAL LOW (ref 39–117)
Bilirubin, Direct: 0.1 mg/dL (ref 0.0–0.3)
Total Bilirubin: 0.5 mg/dL (ref 0.2–1.2)
Total Protein: 6.6 g/dL (ref 6.0–8.3)

## 2023-01-06 ENCOUNTER — Ambulatory Visit (INDEPENDENT_AMBULATORY_CARE_PROVIDER_SITE_OTHER): Payer: Medicare Other

## 2023-01-06 VITALS — Ht 64.0 in | Wt 134.0 lb

## 2023-01-06 DIAGNOSIS — Z0000 Encounter for general adult medical examination without abnormal findings: Secondary | ICD-10-CM

## 2023-01-06 NOTE — Patient Instructions (Signed)
Wendy Nichols , Thank you for taking time to come for your Medicare Wellness Visit. I appreciate your ongoing commitment to your health goals. Please review the following plan we discussed and let me know if I can assist you in the future.   Referrals/Orders/Follow-Ups/Clinician Recommendations: Remember to get your Flu shot  This is a list of the screening recommended for you and due dates:  Health Maintenance  Topic Date Due   Zoster (Shingles) Vaccine (1 of 2) Never done   DTaP/Tdap/Td vaccine (2 - Tdap) 03/11/2022   Flu Shot  11/13/2022   COVID-19 Vaccine (4 - 2023-24 season) 12/14/2022   Mammogram  11/10/2023   Medicare Annual Wellness Visit  01/06/2024   Colon Cancer Screening  03/05/2028   Pneumonia Vaccine  Completed   DEXA scan (bone density measurement)  Completed   Hepatitis C Screening  Completed   HPV Vaccine  Aged Out    Advanced directives: (Copy Requested) Please bring a copy of your health care power of attorney and living will to the office to be added to your chart at your convenience.  Next Medicare Annual Wellness Visit scheduled for next year: Yes 01/08/24 @ 10:30

## 2023-01-06 NOTE — Progress Notes (Signed)
Subjective:   Wendy Nichols is a 72 y.o. female who presents for Medicare Annual (Subsequent) preventive examination.  Visit Complete: Virtual  I connected with  [name] on 01/06/23 by a audio enabled telemedicine application and verified that I am speaking with the correct person using two identifiers.  Patient Location: Other:  In car  Provider Location: Home Office  I discussed the limitations of evaluation and management by telemedicine. The patient expressed understanding and agreed to proceed.   Vital Signs: Unable to obtain new vitals due to this being a telehealth visit.   Cardiac Risk Factors include: advanced age (>33men, >62 women);dyslipidemia;hypertension     Objective:    Today's Vitals   01/06/23 1456  Weight: 134 lb (60.8 kg)  Height: 5\' 4"  (1.626 m)   Body mass index is 23 kg/m.     01/06/2023    3:06 PM 06/18/2021   11:27 AM  Advanced Directives  Does Patient Have a Medical Advance Directive? Yes Yes  Type of Estate agent of [place];Living will Healthcare Power of [place];Living will  Does patient want to make changes to medical advance directive?  No - Patient declined  Copy of Healthcare Power of Attorney in Chart? No - copy requested No - copy requested    Current Medications (verified) Outpatient Encounter Medications as of 01/06/2023  Medication Sig   ALPRAZolam (XANAX) 0.25 MG tablet TAKE 1 TABLET BY MOUTH DAILY AS NEEDED FOR ANXIETY   aspirin 81 MG tablet Take 81 mg by mouth every other day.   cholecalciferol (VITAMIN D) 25 MCG (1000 UT) tablet Take 1,000 Units by mouth daily.   cyanocobalamin (VITAMIN B12) 1000 MCG/ML injection Inject 1 mL into the muscle once every 30 days.   levothyroxine (SYNTHROID) 100 MCG tablet TAKE 1 TABLET BY MOUTH DAILY.   lisinopril (ZESTRIL) 10 MG tablet Take 1 tablet (10 mg total) by mouth daily.   metoprolol tartrate (LOPRESSOR) 25 MG tablet TAKE 0.5 TABLET BY MOUTH DAILY.    rosuvastatin (CRESTOR) 10 MG tablet Take 1 tablet (10 mg total) by mouth daily.   SYRINGE-NEEDLE, DISP, 3 ML 25G X 1" 3 ML MISC Use as directed to administer b12 injections   No facility-administered encounter medications on file as of 01/06/2023.    Allergies (verified) Codeine and Sulfa antibiotics   History: Past Medical History:  Diagnosis Date   GERD (gastroesophageal reflux disease)    History of colon polyps    Hypercholesterolemia    Hypertension    Hypothyroidism    Osteoporosis    Pernicious anemia    Past Surgical History:  Procedure Laterality Date   ABDOMINAL HYSTERECTOMY  1997   abnormal uterine bleeding.    BREAST BIOPSY     biopsies in both breasts   DILATION AND CURETTAGE OF UTERUS  1979   Family History  Problem Relation Age of Onset   Breast cancer Mother    Kidney disease Mother    Hypertension Mother    Heart disease Mother    Heart disease Father        myocardial infarction   Breast cancer Maternal Aunt    Breast cancer Maternal Grandmother    Heart disease Brother        myocardial infarction   Brain cancer Brother    Social History   Socioeconomic History   Marital status: Married    Spouse name: Not on file   Number of children: 3   Years of education: Not  on file   Highest education level: Not on file  Occupational History    Employer: [employer]  Tobacco Use   Smoking status: Never   Smokeless tobacco: Never  Substance and Sexual Activity   Alcohol use: Yes    Alcohol/week: 0.0 standard drinks of alcohol    Comment: occasional wine   Drug use: No   Sexual activity: Not on file  Other Topics Concern   Not on file  Social History Narrative   widow   Social Determinants of Health   Financial Resource Strain: Low Risk  (01/06/2023)   Overall Financial Resource Strain (CARDIA)    Difficulty of Paying Living Expenses: Not hard at all  Food Insecurity: No Food Insecurity (01/06/2023)   Hunger Vital Sign    Worried About Running  Out of Food in the Last Year: Never true    Ran Out of Food in the Last Year: Never true  Transportation Needs: No Transportation Needs (01/06/2023)   PRAPARE - Administrator, Civil Service (Medical): No    Lack of Transportation (Non-Medical): No  Physical Activity: Insufficiently Active (01/06/2023)   Exercise Vital Sign    Days of Exercise per Week: 3 days    Minutes of Exercise per Session: 40 min  Stress: No Stress Concern Present (01/06/2023)   [employer] of Occupational Health - Occupational Stress Questionnaire    Feeling of Stress : Not at all  Social Connections: Moderately Integrated (01/06/2023)   Social Connection and Isolation Panel [NHANES]    Frequency of Communication with Friends and Family: More than three times a week    Frequency of Social Gatherings with Friends and Family: More than three times a week    Attends Religious Services: More than 4 times per year    Active Member of [employer] or Organizations: Yes    Attends Banker Meetings: More than 4 times per year    Marital Status: Widowed    Tobacco Counseling Counseling given: Not Answered   Clinical Intake:  Pre-visit preparation completed: Yes  Pain : No/denies pain     BMI - recorded: 23 Nutritional Status: BMI of 19-24  Normal Nutritional Risks: None Diabetes: No  How often do you need to have someone help you when you read instructions, pamphlets, or other written materials from your doctor or pharmacy?: 1 - Never  Interpreter Needed?: No  Information entered by :: R. Philipp Callegari LPN   Activities of Daily Living    01/06/2023    2:58 PM  In your present state of health, do you have any difficulty performing the following activities:  Hearing? 0  Vision? 0  Comment glasses  Difficulty concentrating or making decisions? 0  Walking or climbing stairs? 0  Dressing or bathing? 0  Doing errands, shopping? 0  Preparing Food and eating ? N  Using the Toilet? N  In the  past six months, have you accidently leaked urine? N  Do you have problems with loss of bowel control? N  Managing your Medications? N  Managing your Finances? N  Housekeeping or managing your Housekeeping? N    Patient Care Team: Dale [place], MD as PCP - General (Internal Medicine)  Indicate any recent Medical Services you may have received from other than [facility] providers in the past year (date may be approximate).     Assessment:   This is a routine wellness examination for Wendy Nichols.  Hearing/Vision screen Hearing Screening - Comments:: No issues Vision Screening - Comments::  glasses   Goals Addressed             This Visit's Progress    Patient Stated       Wants to eat right       Depression Screen    01/06/2023    3:02 PM 05/13/2022   12:23 PM 02/25/2022    4:09 PM 01/01/2022    9:38 AM 06/18/2021   11:25 AM 01/01/2021    9:05 AM 12/27/2019    8:42 AM  PHQ 2/9 Scores  PHQ - 2 Score 0 0 0 0 0 0 0  PHQ- 9 Score 0          Fall Risk    01/06/2023    2:59 PM 05/13/2022   12:23 PM 02/25/2022    4:08 PM 01/01/2022    9:38 AM 06/18/2021   11:26 AM  Fall Risk   Falls in the past year? 0 0 0 0 0  Number falls in past yr: 0 0 0  0  Injury with Fall? 0 0 0    Risk for fall due to : No Fall Risks No Fall Risks No Fall Risks No Fall Risks   Follow up Falls prevention discussed;Falls evaluation completed Falls evaluation completed Falls evaluation completed Falls evaluation completed Falls evaluation completed    MEDICARE RISK AT HOME: Medicare Risk at Home Any stairs in or around the home?: No If so, are there any without handrails?: No Home free of loose throw rugs in walkways, pet beds, electrical cords, etc?: Yes Adequate lighting in your home to reduce risk of falls?: Yes Life alert?: No Use of a cane, walker or w/c?: No Grab bars in the bathroom?: No Shower chair or bench in shower?: Yes Elevated toilet seat or a handicapped toilet?: No  Cognitive  Function:        01/06/2023    3:07 PM  6CIT Screen  What Year? 0 points  What month? 0 points  What time? 0 points  Count back from 20 0 points  Months in reverse 0 points  Repeat phrase 0 points  Total Score 0 points    Immunizations Immunization History  Administered Date(s) Administered   DTaP 03/11/2012   Fluad Quad(high Dose 65+) 01/27/2019, 12/27/2019, 01/03/2021, 01/01/2022   Influenza Split 03/25/2012   Influenza, High Dose Seasonal PF 02/02/2017, 01/07/2018   Influenza,inj,Quad PF,6+ Mos 01/14/2013, 01/06/2014, 02/20/2015, 03/03/2016   Moderna Sars-Covid-2 Vaccination 05/05/2019, 06/07/2019, 04/02/2020   Pneumococcal Conjugate-13 12/08/2017   Pneumococcal Polysaccharide-23 08/26/2016    TDAP status: Up to date Per patient had last year  Flu Vaccine status: Due, Education has been provided regarding the importance of this vaccine. Advised may receive this vaccine at local pharmacy or Health Dept. Aware to provide a copy of the vaccination record if obtained from local pharmacy or Health Dept. Verbalized acceptance and understanding.  Pneumococcal vaccine status: Up to date  Covid-19 vaccine status: Information provided on how to obtain vaccines.   Qualifies for Shingles Vaccine? Yes   Zostavax completed No   Shingrix Completed?: No.    Education has been provided regarding the importance of this vaccine. Patient has been advised to call insurance company to determine out of pocket expense if they have not yet received this vaccine. Advised may also receive vaccine at local pharmacy or Health Dept. Verbalized acceptance and understanding.  Screening Tests Health Maintenance  Topic Date Due   Zoster Vaccines- Shingrix (1 of 2) Never done   DTaP/Tdap/Td (2 -  Tdap) 03/11/2022   INFLUENZA VACCINE  11/13/2022   COVID-19 Vaccine (4 - 2023-24 season) 12/14/2022   Medicare Annual Wellness (AWV)  01/02/2023   MAMMOGRAM  11/10/2023   Colonoscopy  03/05/2028   Pneumonia  Vaccine 69+ Years old  Completed   DEXA SCAN  Completed   Hepatitis C Screening  Completed   HPV VACCINES  Aged Out    Health Maintenance  Health Maintenance Due  Topic Date Due   Zoster Vaccines- Shingrix (1 of 2) Never done   DTaP/Tdap/Td (2 - Tdap) 03/11/2022   INFLUENZA VACCINE  11/13/2022   COVID-19 Vaccine (4 - 2023-24 season) 12/14/2022   Medicare Annual Wellness (AWV)  01/02/2023    Colorectal cancer screening: Type of screening: Colonoscopy. Completed 02/2018. Repeat every 10 years  Mammogram status: Completed 10/2022. Repeat every year  Bone Density status: Completed 02/2022. Results reflect: Bone density results: OSTEOPOROSIS. Repeat every 2 years.  Lung Cancer Screening: (Low Dose CT Chest recommended if Age 70-80 years, 20 pack-year currently smoking OR have quit w/in 15years.) does not qualify.     Additional Screening:  Hepatitis C Screening: does qualify; Completed 12/2020  Vision Screening: Recommended annual ophthalmology exams for early detection of glaucoma and other disorders of the eye. Is the patient up to date with their annual eye exam?  No  Who is the provider or what is the name of the office in which the patient attends annual eye exams? [facility], patient will call and schedule If pt is not established with a provider, would they like to be referred to a provider to establish care? No .   Dental Screening: Recommended annual dental exams for proper oral hygiene    Community Resource Referral / Chronic Care Management: CRR required this visit?  No   CCM required this visit?  No     Plan:     I have personally reviewed and noted the following in the patient's chart:   Medical and social history Use of alcohol, tobacco or illicit drugs  Current medications and supplements including opioid prescriptions. Patient is not currently taking opioid prescriptions. Functional ability and status Nutritional status Physical activity Advanced  directives List of other physicians Hospitalizations, surgeries, and ER visits in previous 12 months Vitals Screenings to include cognitive, depression, and falls Referrals and appointments  In addition, I have reviewed and discussed with patient certain preventive protocols, quality metrics, and best practice recommendations. A written personalized care plan for preventive services as well as general preventive health recommendations were provided to patient.     Sydell Axon, LPN   1/61/0960   After Visit Summary: (MyChart) Due to this being a telephonic visit, the after visit summary with patients personalized plan was offered to patient via MyChart   Nurse Notes: None

## 2023-01-08 ENCOUNTER — Encounter: Payer: Medicare Other | Admitting: Internal Medicine

## 2023-01-15 ENCOUNTER — Encounter: Payer: Self-pay | Admitting: Internal Medicine

## 2023-01-15 ENCOUNTER — Ambulatory Visit (INDEPENDENT_AMBULATORY_CARE_PROVIDER_SITE_OTHER): Payer: Medicare Other | Admitting: Internal Medicine

## 2023-01-15 VITALS — BP 116/70 | HR 65 | Temp 98.0°F | Resp 16 | Ht 64.0 in | Wt 139.4 lb

## 2023-01-15 DIAGNOSIS — R944 Abnormal results of kidney function studies: Secondary | ICD-10-CM | POA: Diagnosis not present

## 2023-01-15 DIAGNOSIS — M810 Age-related osteoporosis without current pathological fracture: Secondary | ICD-10-CM

## 2023-01-15 DIAGNOSIS — Z23 Encounter for immunization: Secondary | ICD-10-CM

## 2023-01-15 DIAGNOSIS — Z0000 Encounter for general adult medical examination without abnormal findings: Secondary | ICD-10-CM | POA: Diagnosis not present

## 2023-01-15 DIAGNOSIS — E538 Deficiency of other specified B group vitamins: Secondary | ICD-10-CM | POA: Diagnosis not present

## 2023-01-15 DIAGNOSIS — E039 Hypothyroidism, unspecified: Secondary | ICD-10-CM

## 2023-01-15 DIAGNOSIS — E7800 Pure hypercholesterolemia, unspecified: Secondary | ICD-10-CM

## 2023-01-15 DIAGNOSIS — F439 Reaction to severe stress, unspecified: Secondary | ICD-10-CM

## 2023-01-15 DIAGNOSIS — I10 Essential (primary) hypertension: Secondary | ICD-10-CM

## 2023-01-15 MED ORDER — CYANOCOBALAMIN 1000 MCG/ML IJ SOLN
1000.0000 ug | Freq: Once | INTRAMUSCULAR | Status: AC
Start: 2023-01-15 — End: 2023-01-15
  Administered 2023-01-15: 1000 ug via INTRAMUSCULAR

## 2023-01-15 MED ORDER — LEVOTHYROXINE SODIUM 100 MCG PO TABS: 100.0000 ug | ORAL_TABLET | Freq: Every day | ORAL | 3 refills | Status: DC

## 2023-01-15 NOTE — Progress Notes (Signed)
D) 25 MCG (1000 UT) tablet Take 1,000 Units by mouth daily.   cyanocobalamin (VITAMIN B12) 1000 MCG/ML injection Inject 1 mL into the muscle once every 30 days.   lisinopril (ZESTRIL) 10 MG tablet Take 1 tablet (10 mg total) by mouth daily.   metoprolol tartrate (LOPRESSOR) 25 MG tablet TAKE 0.5 TABLET BY MOUTH DAILY.   rosuvastatin (CRESTOR) 10 MG tablet Take 1 tablet (10 mg total) by mouth daily.   SYRINGE-NEEDLE, DISP, 3 ML 25G X 1" 3 ML MISC Use as directed to administer b12 injections   [DISCONTINUED] levothyroxine (SYNTHROID) 100 MCG tablet TAKE 1 TABLET BY MOUTH DAILY.   levothyroxine (SYNTHROID) 100 MCG tablet Take 1 tablet (100 mcg total) by mouth daily.   [EXPIRED] cyanocobalamin (VITAMIN B12) injection 1,000 mcg    No facility-administered encounter medications on file as of 01/15/2023.     Lab Results  Component Value Date   WBC 4.4 07/02/2022   HGB 12.7 07/02/2022   HCT 37.7 07/02/2022   PLT 268.0 07/02/2022   GLUCOSE 96 01/05/2023   CHOL 156 01/05/2023   TRIG 47.0 01/05/2023   HDL 74.80 01/05/2023   LDLCALC 72 01/05/2023   ALT 14 01/05/2023   AST 18 01/05/2023   NA 139 01/05/2023   K 4.8 01/05/2023   CL 109 01/05/2023   CREATININE 0.96 01/05/2023   BUN 25 (H) 01/05/2023   CO2 24 01/05/2023   TSH 0.83 07/02/2022   HGBA1C 5.5 06/21/2019    DG Bone Density  Result Date: 02/20/2022 EXAM: DUAL X-RAY ABSORPTIOMETRY (DXA) FOR BONE MINERAL DENSITY IMPRESSION: Your patient Wendy Nichols completed a BMD test on 02/20/2022 using the [employer] DXA System (software version: 14.10) manufactured by [employer]. The following summarizes the results of our evaluation.  Technologist: MTB PATIENT BIOGRAPHICAL: Name: Wendy Nichols, Wendy Nichols Patient ID: [id number] Birth Date: Aug 25, 1950 Height: 64.0 in. Gender: Female Exam Date: 02/20/2022 Weight: 138.0 lbs. Indications: Caucasian, Hypothyroid, Hysterectomy, Postmenopausal Fractures: Right forearm Treatments: Calcium, Levothyroxine, Vitamin D DENSITOMETRY RESULTS: Site         Region     Measured Date Measured Age WHO Classification Young Adult T-score BMD         %Change vs. Previous Significant Change (*) AP Spine L1-L3 02/20/2022 72 Osteopenia -1.4 1.011 g/cm2 -2.5% - AP Spine L1-L3 11/19/2016 72 Osteopenia -1.2 1.037 g/cm2 - - DualFemur Neck Right 02/20/2022 72 Osteopenia -2.2 0.733 g/cm2 0.5% - DualFemur Neck Right 11/19/2016 72 Osteopenia -2.2 0.729 g/cm2 - - DualFemur Total Mean 02/20/2022 72 Osteopenia -2.0 0.762 g/cm2 -1.2% - DualFemur Total Mean 11/19/2016 72 Osteopenia -1.9 0.771 g/cm2 - - Left Forearm Radius 33% 02/20/2022 [age] Osteoporosis -3.8 0.542 g/cm2 - - ASSESSMENT: The BMD measured at Forearm Radius 33% is 0.542 g/cm2 with a T-score of -3.8. This patient is considered osteoporotic according to World Health Organization [facility]) criteria. The scan quality is good. L-4 was excluded due to degenerative changes. Compared with prior study, there has been no significant change in the spine. Compared with prior study, there has been no significant change in the total hip. World Science writer [facility]) criteria for post-menopausal, Caucasian Women: Normal:                   T-score at or above -1 SD Osteopenia/low bone mass: T-score between -1 and -2.5 SD Osteoporosis:             T-score at or below -2.5 SD RECOMMENDATIONS: 1. All patients should optimize calcium and vitamin  Subjective:    Patient ID: Wendy Nichols, female    DOB: October 30, 1950, 72 y.o.   MRN: [id number]  Patient here for  Chief Complaint  Patient presents with   Annual Exam    HPI Here for a physical exam. History of hypercholesterolemia, hypertension and hypothyroidism. Recent bone density - osteoporosis. Discussed treatment last visit. Was referred for reclast. Still has not been scheduled.  Discussed endocrinology referral.  Agreeable.  Overall she feels she is doing relatively well.  Stays active.  No chest pain or sob reported.  No abdominal pain or bowel change reported.  Discussed labs.    Past Medical History:  Diagnosis Date   GERD (gastroesophageal reflux disease)    History of colon polyps    Hypercholesterolemia    Hypertension    Hypothyroidism    Osteoporosis    Pernicious anemia    Past Surgical History:  Procedure Laterality Date   ABDOMINAL HYSTERECTOMY  1997   abnormal uterine bleeding.    BREAST BIOPSY     biopsies in both breasts   DILATION AND CURETTAGE OF UTERUS  1979   Family History  Problem Relation Age of Onset   Breast cancer Mother    Kidney disease Mother    Hypertension Mother    Heart disease Mother    Heart disease Father        myocardial infarction   Breast cancer Maternal Aunt    Breast cancer Maternal Grandmother    Heart disease Brother        myocardial infarction   Brain cancer Brother    Social History   Socioeconomic History   Marital status: Married    Spouse name: Not on file   Number of children: 3   Years of education: Not on file   Highest education level: Not on file  Occupational History    Employer: [employer]  Tobacco Use   Smoking status: Never   Smokeless tobacco: Never  Substance and Sexual Activity   Alcohol use: Yes    Alcohol/week: 0.0 standard drinks of alcohol    Comment: occasional wine   Drug use: No   Sexual activity: Not on file  Other Topics Concern   Not on file  Social History Narrative    widow   Social Determinants of Health   Financial Resource Strain: Low Risk  (01/06/2023)   Overall Financial Resource Strain (CARDIA)    Difficulty of Paying Living Expenses: Not hard at all  Food Insecurity: No Food Insecurity (01/06/2023)   Hunger Vital Sign    Worried About Running Out of Food in the Last Year: Never true    Ran Out of Food in the Last Year: Never true  Transportation Needs: No Transportation Needs (01/06/2023)   PRAPARE - Administrator, Civil Service (Medical): No    Lack of Transportation (Non-Medical): No  Physical Activity: Insufficiently Active (01/06/2023)   Exercise Vital Sign    Days of Exercise per Week: 3 days    Minutes of Exercise per Session: 40 min  Stress: No Stress Concern Present (01/06/2023)   [employer] of Occupational Health - Occupational Stress Questionnaire    Feeling of Stress : Not at all  Social Connections: Moderately Integrated (01/06/2023)   Social Connection and Isolation Panel [NHANES]    Frequency of Communication with Friends and Family: More than three times a week    Frequency of Social Gatherings with Friends and Family: More than three times a week  Subjective:    Patient ID: Wendy Nichols, female    DOB: October 30, 1950, 72 y.o.   MRN: [id number]  Patient here for  Chief Complaint  Patient presents with   Annual Exam    HPI Here for a physical exam. History of hypercholesterolemia, hypertension and hypothyroidism. Recent bone density - osteoporosis. Discussed treatment last visit. Was referred for reclast. Still has not been scheduled.  Discussed endocrinology referral.  Agreeable.  Overall she feels she is doing relatively well.  Stays active.  No chest pain or sob reported.  No abdominal pain or bowel change reported.  Discussed labs.    Past Medical History:  Diagnosis Date   GERD (gastroesophageal reflux disease)    History of colon polyps    Hypercholesterolemia    Hypertension    Hypothyroidism    Osteoporosis    Pernicious anemia    Past Surgical History:  Procedure Laterality Date   ABDOMINAL HYSTERECTOMY  1997   abnormal uterine bleeding.    BREAST BIOPSY     biopsies in both breasts   DILATION AND CURETTAGE OF UTERUS  1979   Family History  Problem Relation Age of Onset   Breast cancer Mother    Kidney disease Mother    Hypertension Mother    Heart disease Mother    Heart disease Father        myocardial infarction   Breast cancer Maternal Aunt    Breast cancer Maternal Grandmother    Heart disease Brother        myocardial infarction   Brain cancer Brother    Social History   Socioeconomic History   Marital status: Married    Spouse name: Not on file   Number of children: 3   Years of education: Not on file   Highest education level: Not on file  Occupational History    Employer: [employer]  Tobacco Use   Smoking status: Never   Smokeless tobacco: Never  Substance and Sexual Activity   Alcohol use: Yes    Alcohol/week: 0.0 standard drinks of alcohol    Comment: occasional wine   Drug use: No   Sexual activity: Not on file  Other Topics Concern   Not on file  Social History Narrative    widow   Social Determinants of Health   Financial Resource Strain: Low Risk  (01/06/2023)   Overall Financial Resource Strain (CARDIA)    Difficulty of Paying Living Expenses: Not hard at all  Food Insecurity: No Food Insecurity (01/06/2023)   Hunger Vital Sign    Worried About Running Out of Food in the Last Year: Never true    Ran Out of Food in the Last Year: Never true  Transportation Needs: No Transportation Needs (01/06/2023)   PRAPARE - Administrator, Civil Service (Medical): No    Lack of Transportation (Non-Medical): No  Physical Activity: Insufficiently Active (01/06/2023)   Exercise Vital Sign    Days of Exercise per Week: 3 days    Minutes of Exercise per Session: 40 min  Stress: No Stress Concern Present (01/06/2023)   [employer] of Occupational Health - Occupational Stress Questionnaire    Feeling of Stress : Not at all  Social Connections: Moderately Integrated (01/06/2023)   Social Connection and Isolation Panel [NHANES]    Frequency of Communication with Friends and Family: More than three times a week    Frequency of Social Gatherings with Friends and Family: More than three times a week  D) 25 MCG (1000 UT) tablet Take 1,000 Units by mouth daily.   cyanocobalamin (VITAMIN B12) 1000 MCG/ML injection Inject 1 mL into the muscle once every 30 days.   lisinopril (ZESTRIL) 10 MG tablet Take 1 tablet (10 mg total) by mouth daily.   metoprolol tartrate (LOPRESSOR) 25 MG tablet TAKE 0.5 TABLET BY MOUTH DAILY.   rosuvastatin (CRESTOR) 10 MG tablet Take 1 tablet (10 mg total) by mouth daily.   SYRINGE-NEEDLE, DISP, 3 ML 25G X 1" 3 ML MISC Use as directed to administer b12 injections   [DISCONTINUED] levothyroxine (SYNTHROID) 100 MCG tablet TAKE 1 TABLET BY MOUTH DAILY.   levothyroxine (SYNTHROID) 100 MCG tablet Take 1 tablet (100 mcg total) by mouth daily.   [EXPIRED] cyanocobalamin (VITAMIN B12) injection 1,000 mcg    No facility-administered encounter medications on file as of 01/15/2023.     Lab Results  Component Value Date   WBC 4.4 07/02/2022   HGB 12.7 07/02/2022   HCT 37.7 07/02/2022   PLT 268.0 07/02/2022   GLUCOSE 96 01/05/2023   CHOL 156 01/05/2023   TRIG 47.0 01/05/2023   HDL 74.80 01/05/2023   LDLCALC 72 01/05/2023   ALT 14 01/05/2023   AST 18 01/05/2023   NA 139 01/05/2023   K 4.8 01/05/2023   CL 109 01/05/2023   CREATININE 0.96 01/05/2023   BUN 25 (H) 01/05/2023   CO2 24 01/05/2023   TSH 0.83 07/02/2022   HGBA1C 5.5 06/21/2019    DG Bone Density  Result Date: 02/20/2022 EXAM: DUAL X-RAY ABSORPTIOMETRY (DXA) FOR BONE MINERAL DENSITY IMPRESSION: Your patient Wendy Nichols completed a BMD test on 02/20/2022 using the [employer] DXA System (software version: 14.10) manufactured by [employer]. The following summarizes the results of our evaluation.  Technologist: MTB PATIENT BIOGRAPHICAL: Name: Wendy Nichols, Wendy Nichols Patient ID: [id number] Birth Date: Aug 25, 1950 Height: 64.0 in. Gender: Female Exam Date: 02/20/2022 Weight: 138.0 lbs. Indications: Caucasian, Hypothyroid, Hysterectomy, Postmenopausal Fractures: Right forearm Treatments: Calcium, Levothyroxine, Vitamin D DENSITOMETRY RESULTS: Site         Region     Measured Date Measured Age WHO Classification Young Adult T-score BMD         %Change vs. Previous Significant Change (*) AP Spine L1-L3 02/20/2022 72 Osteopenia -1.4 1.011 g/cm2 -2.5% - AP Spine L1-L3 11/19/2016 72 Osteopenia -1.2 1.037 g/cm2 - - DualFemur Neck Right 02/20/2022 72 Osteopenia -2.2 0.733 g/cm2 0.5% - DualFemur Neck Right 11/19/2016 72 Osteopenia -2.2 0.729 g/cm2 - - DualFemur Total Mean 02/20/2022 72 Osteopenia -2.0 0.762 g/cm2 -1.2% - DualFemur Total Mean 11/19/2016 72 Osteopenia -1.9 0.771 g/cm2 - - Left Forearm Radius 33% 02/20/2022 71.6 Osteoporosis -3.8 0.542 g/cm2 - - ASSESSMENT: The BMD measured at Forearm Radius 33% is 0.542 g/cm2 with a T-score of -3.8. This patient is considered osteoporotic according to World Health Organization [facility]) criteria. The scan quality is good. L-4 was excluded due to degenerative changes. Compared with prior study, there has been no significant change in the spine. Compared with prior study, there has been no significant change in the total hip. World Science writer [facility]) criteria for post-menopausal, Caucasian Women: Normal:                   T-score at or above -1 SD Osteopenia/low bone mass: T-score between -1 and -2.5 SD Osteoporosis:             T-score at or below -2.5 SD RECOMMENDATIONS: 1. All patients should optimize calcium and vitamin

## 2023-01-15 NOTE — Assessment & Plan Note (Signed)
Physical today 01/15/23.  Mammogram 11/11/22- negative.  Recommended f/u in one year.  Colonoscopy 02/2018.  Recommended f/u colonoscopy in 10 years.

## 2023-01-18 ENCOUNTER — Encounter: Payer: Self-pay | Admitting: Internal Medicine

## 2023-01-18 NOTE — Assessment & Plan Note (Signed)
Blood pressure has been under good control.  Continue lisinopril.  Follow pressures.  Follow metabolic panel.

## 2023-01-18 NOTE — Assessment & Plan Note (Signed)
On crestor.  Low cholesterol diet and exercise.  Follow lipid panel and liver function tests.

## 2023-01-18 NOTE — Assessment & Plan Note (Signed)
B12 injection 

## 2023-01-18 NOTE — Assessment & Plan Note (Signed)
Have discussed recent bone density results and treatment options.  Discussed results - osteoporosis - -3.2 - left radius, -2.2 right femur neck and -2.0 - total femur.  Discussed calcium, vitamin D and weight bearing exercise.  Discussed prescription medication, including oral bisphosphonates, reclast and prolia. Discussed possible side effects of medication and proper way to take medication.  She prefers reclast. Refer to endocrinology.

## 2023-01-18 NOTE — Assessment & Plan Note (Signed)
Overall appears to be handling things well.  Follow.  ?

## 2023-01-18 NOTE — Assessment & Plan Note (Signed)
On thyroid replacement.  Follow tsh.  

## 2023-01-26 DIAGNOSIS — H2513 Age-related nuclear cataract, bilateral: Secondary | ICD-10-CM | POA: Diagnosis not present

## 2023-01-26 DIAGNOSIS — H43813 Vitreous degeneration, bilateral: Secondary | ICD-10-CM | POA: Diagnosis not present

## 2023-02-03 DIAGNOSIS — M8588 Other specified disorders of bone density and structure, other site: Secondary | ICD-10-CM | POA: Diagnosis not present

## 2023-02-03 DIAGNOSIS — M810 Age-related osteoporosis without current pathological fracture: Secondary | ICD-10-CM | POA: Diagnosis not present

## 2023-02-03 DIAGNOSIS — E039 Hypothyroidism, unspecified: Secondary | ICD-10-CM | POA: Diagnosis not present

## 2023-02-19 ENCOUNTER — Other Ambulatory Visit (INDEPENDENT_AMBULATORY_CARE_PROVIDER_SITE_OTHER): Payer: Medicare Other

## 2023-02-19 DIAGNOSIS — R944 Abnormal results of kidney function studies: Secondary | ICD-10-CM

## 2023-02-19 LAB — BASIC METABOLIC PANEL
BUN: 18 mg/dL (ref 6–23)
CO2: 25 meq/L (ref 19–32)
Calcium: 9 mg/dL (ref 8.4–10.5)
Chloride: 104 meq/L (ref 96–112)
Creatinine, Ser: 0.99 mg/dL (ref 0.40–1.20)
GFR: 57.3 mL/min — ABNORMAL LOW (ref >60.00)
Glucose, Bld: 97 mg/dL (ref 70–99)
Potassium: 4.8 meq/L (ref 3.5–5.1)
Sodium: 135 meq/L (ref 135–145)

## 2023-02-20 ENCOUNTER — Other Ambulatory Visit: Payer: Self-pay

## 2023-02-20 ENCOUNTER — Telehealth: Payer: Self-pay

## 2023-02-20 DIAGNOSIS — R944 Abnormal results of kidney function studies: Secondary | ICD-10-CM

## 2023-02-20 NOTE — Telephone Encounter (Signed)
-----   Message from [place] sent at 02/20/2023  4:36 AM EST ----- Please call and notify - kidney function is relatively stable from last check.  We will need to continue to follow.  Continue to avoid antiinflammatory medication. Would like to recheck met b (diagnosis - decreased gfr) in 8 weeks.

## 2023-02-20 NOTE — Telephone Encounter (Signed)
See result note.  

## 2023-02-20 NOTE — Telephone Encounter (Signed)
Patient states she is returning our call.  I read Dr. Westley Hummer Scott's message to patient.  Patient states she would like to know if she is able to take aspirin.  I scheduled patient for a lab visit on 04/17/2023.  Patient states she would like for Rita Ohara, LPN, to please call her and talk with her.  Patient states it will make her feel better going into the weekend.  I transferred call to [place].

## 2023-02-25 NOTE — Telephone Encounter (Signed)
error 

## 2023-04-17 ENCOUNTER — Other Ambulatory Visit: Payer: Self-pay

## 2023-04-17 ENCOUNTER — Other Ambulatory Visit (INDEPENDENT_AMBULATORY_CARE_PROVIDER_SITE_OTHER): Payer: Medicare Other

## 2023-04-17 DIAGNOSIS — E871 Hypo-osmolality and hyponatremia: Secondary | ICD-10-CM

## 2023-04-17 DIAGNOSIS — R944 Abnormal results of kidney function studies: Secondary | ICD-10-CM

## 2023-04-17 LAB — BASIC METABOLIC PANEL
BUN: 13 mg/dL (ref 6–23)
CO2: 25 meq/L (ref 19–32)
Calcium: 9.1 mg/dL (ref 8.4–10.5)
Chloride: 97 meq/L (ref 96–112)
Creatinine, Ser: 0.85 mg/dL (ref 0.40–1.20)
GFR: 68.73 mL/min (ref >60.00)
Glucose, Bld: 120 mg/dL — ABNORMAL HIGH (ref 70–99)
Potassium: 4 meq/L (ref 3.5–5.1)
Sodium: 131 meq/L — ABNORMAL LOW (ref 135–145)

## 2023-04-23 ENCOUNTER — Other Ambulatory Visit (INDEPENDENT_AMBULATORY_CARE_PROVIDER_SITE_OTHER): Payer: Medicare Other

## 2023-04-23 DIAGNOSIS — E871 Hypo-osmolality and hyponatremia: Secondary | ICD-10-CM

## 2023-04-23 LAB — SODIUM: Sodium: 132 meq/L — ABNORMAL LOW (ref 135–145)

## 2023-04-24 ENCOUNTER — Other Ambulatory Visit: Payer: Self-pay

## 2023-04-24 DIAGNOSIS — E871 Hypo-osmolality and hyponatremia: Secondary | ICD-10-CM

## 2023-04-27 DIAGNOSIS — K080 Exfoliation of teeth due to systemic causes: Secondary | ICD-10-CM | POA: Diagnosis not present

## 2023-05-07 ENCOUNTER — Other Ambulatory Visit: Payer: Self-pay | Admitting: Internal Medicine

## 2023-05-07 DIAGNOSIS — E7800 Pure hypercholesterolemia, unspecified: Secondary | ICD-10-CM

## 2023-05-18 ENCOUNTER — Other Ambulatory Visit (INDEPENDENT_AMBULATORY_CARE_PROVIDER_SITE_OTHER): Payer: Medicare Other

## 2023-05-18 DIAGNOSIS — E871 Hypo-osmolality and hyponatremia: Secondary | ICD-10-CM

## 2023-05-18 LAB — SODIUM: Sodium: 135 meq/L (ref 135–145)

## 2023-05-19 ENCOUNTER — Encounter: Payer: Self-pay | Admitting: Internal Medicine

## 2023-06-11 ENCOUNTER — Other Ambulatory Visit: Payer: Self-pay | Admitting: Internal Medicine

## 2023-07-13 ENCOUNTER — Other Ambulatory Visit: Payer: Self-pay

## 2023-07-13 DIAGNOSIS — E039 Hypothyroidism, unspecified: Secondary | ICD-10-CM

## 2023-07-13 DIAGNOSIS — I10 Essential (primary) hypertension: Secondary | ICD-10-CM

## 2023-07-13 DIAGNOSIS — E7800 Pure hypercholesterolemia, unspecified: Secondary | ICD-10-CM

## 2023-07-14 ENCOUNTER — Other Ambulatory Visit (INDEPENDENT_AMBULATORY_CARE_PROVIDER_SITE_OTHER): Payer: Medicare Other

## 2023-07-14 DIAGNOSIS — E039 Hypothyroidism, unspecified: Secondary | ICD-10-CM | POA: Diagnosis not present

## 2023-07-14 DIAGNOSIS — E7800 Pure hypercholesterolemia, unspecified: Secondary | ICD-10-CM | POA: Diagnosis not present

## 2023-07-14 DIAGNOSIS — I10 Essential (primary) hypertension: Secondary | ICD-10-CM

## 2023-07-14 LAB — LIPID PANEL
Cholesterol: 171 mg/dL (ref 0–200)
HDL: 73.1 mg/dL (ref >39.00)
LDL Cholesterol: 85 mg/dL (ref 0–99)
NonHDL: 98.31
Total CHOL/HDL Ratio: 2
Triglycerides: 65 mg/dL (ref 0.0–149.0)
VLDL: 13 mg/dL (ref 0.0–40.0)

## 2023-07-14 LAB — BASIC METABOLIC PANEL WITH GFR
BUN: 19 mg/dL (ref 6–23)
CO2: 26 meq/L (ref 19–32)
Calcium: 9.5 mg/dL (ref 8.4–10.5)
Chloride: 105 meq/L (ref 96–112)
Creatinine, Ser: 0.96 mg/dL (ref 0.40–1.20)
GFR: 59.29 mL/min — ABNORMAL LOW (ref >60.00)
Glucose, Bld: 96 mg/dL (ref 70–99)
Potassium: 4.9 meq/L (ref 3.5–5.1)
Sodium: 137 meq/L (ref 135–145)

## 2023-07-14 LAB — CBC WITH DIFFERENTIAL/PLATELET
Basophils Absolute: 0 10*3/uL (ref 0.0–0.1)
Basophils Relative: 0.5 % (ref 0.0–3.0)
Eosinophils Absolute: 0.1 10*3/uL (ref 0.0–0.7)
Eosinophils Relative: 1.7 % (ref 0.0–5.0)
HCT: 37.9 % (ref 36.0–46.0)
Hemoglobin: 12.8 g/dL (ref 12.0–15.0)
Lymphocytes Relative: 21.8 % (ref 12.0–46.0)
Lymphs Abs: 1.1 10*3/uL (ref 0.7–4.0)
MCHC: 33.8 g/dL (ref 30.0–36.0)
MCV: 91.1 fl (ref 78.0–100.0)
Monocytes Absolute: 0.5 10*3/uL (ref 0.1–1.0)
Monocytes Relative: 9.6 % (ref 3.0–12.0)
Neutro Abs: 3.2 10*3/uL (ref 1.4–7.7)
Neutrophils Relative %: 66.4 % (ref 43.0–77.0)
Platelets: 267 10*3/uL (ref 150.0–400.0)
RBC: 4.16 Mil/uL (ref 3.87–5.11)
RDW: 13 % (ref 11.5–15.5)
WBC: 4.8 10*3/uL (ref 4.0–10.5)

## 2023-07-14 LAB — HEPATIC FUNCTION PANEL
ALT: 14 U/L (ref 0–35)
AST: 15 U/L (ref 0–37)
Albumin: 4.4 g/dL (ref 3.5–5.2)
Alkaline Phosphatase: 44 U/L (ref 39–117)
Bilirubin, Direct: 0.1 mg/dL (ref 0.0–0.3)
Total Bilirubin: 0.6 mg/dL (ref 0.2–1.2)
Total Protein: 7.1 g/dL (ref 6.0–8.3)

## 2023-07-14 LAB — TSH: TSH: 0.57 u[IU]/mL (ref 0.35–5.50)

## 2023-07-16 ENCOUNTER — Ambulatory Visit (INDEPENDENT_AMBULATORY_CARE_PROVIDER_SITE_OTHER): Payer: Medicare Other | Admitting: Internal Medicine

## 2023-07-16 VITALS — BP 120/72 | HR 68 | Temp 98.0°F | Resp 16 | Ht 64.0 in | Wt 139.0 lb

## 2023-07-16 DIAGNOSIS — F439 Reaction to severe stress, unspecified: Secondary | ICD-10-CM

## 2023-07-16 DIAGNOSIS — E039 Hypothyroidism, unspecified: Secondary | ICD-10-CM

## 2023-07-16 DIAGNOSIS — M810 Age-related osteoporosis without current pathological fracture: Secondary | ICD-10-CM

## 2023-07-16 DIAGNOSIS — R519 Headache, unspecified: Secondary | ICD-10-CM

## 2023-07-16 DIAGNOSIS — I10 Essential (primary) hypertension: Secondary | ICD-10-CM

## 2023-07-16 DIAGNOSIS — E7800 Pure hypercholesterolemia, unspecified: Secondary | ICD-10-CM

## 2023-07-16 DIAGNOSIS — Z860100 Personal history of colon polyps, unspecified: Secondary | ICD-10-CM

## 2023-07-16 DIAGNOSIS — Z9109 Other allergy status, other than to drugs and biological substances: Secondary | ICD-10-CM

## 2023-07-16 MED ORDER — CYANOCOBALAMIN 1000 MCG/ML IJ SOLN: INTRAMUSCULAR | 0 refills | Status: AC

## 2023-07-16 MED ORDER — ONDANSETRON HCL 4 MG PO TABS: 4.0000 mg | ORAL_TABLET | Freq: Two times a day (BID) | ORAL | 0 refills | Status: AC | PRN

## 2023-07-16 MED ORDER — CEFDINIR 300 MG PO CAPS: 300.0000 mg | ORAL_CAPSULE | Freq: Two times a day (BID) | ORAL | 0 refills | Status: DC

## 2023-07-16 NOTE — Progress Notes (Signed)
 z  Subjective:    Patient ID: Wendy Nichols, female    DOB: [birth year], 73 y.o.   MRN: [id number]  Patient here for  Chief Complaint  Patient presents with   Medical Management of Chronic Issues    HPI Here for a scheduled follow up - follow up regarding hypercholesterolemia, hypothyroidism and hypertension. Evaluated by endocrinology - f/u bone density. Recommended to continue calcium and vitamin D and weight bearing exercise. She reports that she noticed Sunday 07/12/23 - headache. Prior to Sunday, she had been busy. Decreased sleep. Also was questioning if could have been allergy triggered. She has a history of migraine headaches with visual auras. This was not as severe. She noticed the start of visual aura, but did not progress. No severe headache. Drank a coke zero and within 30 minutes headache better. Discussed using flonase for drainage and allergy symptoms. No headache now. Planning to travel to [place] in June. Request abx and zofran to take with her. Overall she feels she is doing well. No chest pain or sob reported. No abdominal pain or bowel change reported.    Past Medical History:  Diagnosis Date   GERD (gastroesophageal reflux disease)    History of colon polyps    Hypercholesterolemia    Hypertension    Hypothyroidism    Osteoporosis    Pernicious anemia    Past Surgical History:  Procedure Laterality Date   ABDOMINAL HYSTERECTOMY  1997   abnormal uterine bleeding.    BREAST BIOPSY     biopsies in both breasts   DILATION AND CURETTAGE OF UTERUS  1979   Family History  Problem Relation Age of Onset   Breast cancer Mother    Kidney disease Mother    Hypertension Mother    Heart disease Mother    Heart disease Father        myocardial infarction   Breast cancer Maternal Aunt    Breast cancer Maternal Grandmother    Heart disease Brother        myocardial infarction   Brain cancer Brother    Social History   Socioeconomic History   Marital status:  Married    Spouse name: Not on file   Number of children: 3   Years of education: Not on file   Highest education level: Not on file  Occupational History    Employer: [employer]  Tobacco Use   Smoking status: Never   Smokeless tobacco: Never  Substance and Sexual Activity   Alcohol use: Yes    Alcohol/week: 0.0 standard drinks of alcohol    Comment: occasional wine   Drug use: No   Sexual activity: Not on file  Other Topics Concern   Not on file  Social History Narrative   widow   Social Drivers of Health   Financial Resource Strain: Low Risk  (07/14/2023)   Overall Financial Resource Strain (CARDIA)    Difficulty of Paying Living Expenses: Not hard at all  Food Insecurity: No Food Insecurity (07/14/2023)   Hunger Vital Sign    Worried About Running Out of Food in the Last Year: Never true    Ran Out of Food in the Last Year: Never true  Transportation Needs: No Transportation Needs (07/14/2023)   PRAPARE - Administrator, Civil Service (Medical): No    Lack of Transportation (Non-Medical): No  Physical Activity: Sufficiently Active (07/14/2023)   Exercise Vital Sign    Days of Exercise per Week: 5 days  Minutes of Exercise per Session: 30 min  Stress: No Stress Concern Present (07/14/2023)   [employer] of Occupational Health - Occupational Stress Questionnaire    Feeling of Stress : Not at all  Social Connections: Moderately Integrated (07/14/2023)   Social Connection and Isolation Panel [NHANES]    Frequency of Communication with Friends and Family: More than three times a week    Frequency of Social Gatherings with Friends and Family: Twice a week    Attends Religious Services: More than 4 times per year    Active Member of [employer] or Organizations: Yes    Attends Banker Meetings: More than 4 times per year    Marital Status: Widowed     Review of Systems  Constitutional:  Negative for appetite change and unexpected weight change.  HENT:   Positive for postnasal drip.        No increased sinus pressure.   Respiratory:  Negative for cough, chest tightness and shortness of breath.   Cardiovascular:  Negative for chest pain, palpitations and leg swelling.  Gastrointestinal:  Negative for abdominal pain, diarrhea, nausea and vomiting.  Genitourinary:  Negative for difficulty urinating and dysuria.  Musculoskeletal:  Negative for joint swelling and myalgias.  Skin:  Negative for color change and rash.  Neurological:  Negative for dizziness.       No headache now.   Psychiatric/Behavioral:  Negative for agitation and dysphoric mood.        Objective:     BP 120/72   Pulse 68   Temp 98 F (36.7 C)   Resp 16   Ht 5\' 4"  (1.626 m)   Wt 139 lb (63 kg)   LMP 03/25/1996   SpO2 99%   BMI 23.86 kg/m  Wt Readings from Last 3 Encounters:  07/16/23 139 lb (63 kg)  01/15/23 139 lb 6.4 oz (63.2 kg)  01/06/23 134 lb (60.8 kg)    Physical Exam Vitals reviewed.  Constitutional:      General: She is not in acute distress.    Appearance: Normal appearance.  HENT:     Head: Normocephalic and atraumatic.     Right Ear: External ear normal.     Left Ear: External ear normal.     Mouth/Throat:     Pharynx: No oropharyngeal exudate or posterior oropharyngeal erythema.  Eyes:     General: No scleral icterus.       Right eye: No discharge.        Left eye: No discharge.     Conjunctiva/sclera: Conjunctivae normal.  Neck:     Thyroid: No thyromegaly.  Cardiovascular:     Rate and Rhythm: Normal rate and regular rhythm.  Pulmonary:     Effort: No respiratory distress.     Breath sounds: Normal breath sounds. No wheezing.  Abdominal:     General: Bowel sounds are normal.     Palpations: Abdomen is soft.     Tenderness: There is no abdominal tenderness.  Musculoskeletal:        General: No swelling or tenderness.     Cervical back: Neck supple. No tenderness.  Lymphadenopathy:     Cervical: No cervical adenopathy.   Skin:    Findings: No erythema or rash.  Neurological:     Mental Status: She is alert.  Psychiatric:        Mood and Affect: Mood normal.        Behavior: Behavior normal.  Outpatient Encounter Medications as of 07/16/2023  Medication Sig   cefdinir (OMNICEF) 300 MG capsule Take 1 capsule (300 mg total) by mouth 2 (two) times daily.   ondansetron (ZOFRAN) 4 MG tablet Take 1 tablet (4 mg total) by mouth 2 (two) times daily as needed for nausea or vomiting.   ALPRAZolam (XANAX) 0.25 MG tablet TAKE 1 TABLET BY MOUTH DAILY AS NEEDED FOR ANXIETY   aspirin 81 MG tablet Take 81 mg by mouth every other day.   cholecalciferol (VITAMIN D) 25 MCG (1000 UT) tablet Take 1,000 Units by mouth daily.   cyanocobalamin (VITAMIN B12) 1000 MCG/ML injection Inject 1 mL into the muscle once every 30 days.   levothyroxine (SYNTHROID) 100 MCG tablet Take 1 tablet (100 mcg total) by mouth daily.   lisinopril (ZESTRIL) 10 MG tablet TAKE 1 TABLET BY MOUTH ONCE DAILY   metoprolol tartrate (LOPRESSOR) 25 MG tablet TAKE 1/2 TABLET BY MOUTH DAILY   rosuvastatin (CRESTOR) 10 MG tablet TAKE 1 TABLET BY MOUTH ONCE DAILY   SYRINGE-NEEDLE, DISP, 3 ML 25G X 1" 3 ML MISC Use as directed to administer b12 injections   [DISCONTINUED] cyanocobalamin (VITAMIN B12) 1000 MCG/ML injection Inject 1 mL into the muscle once every 30 days.   No facility-administered encounter medications on file as of 07/16/2023.     Lab Results  Component Value Date   WBC 4.8 07/14/2023   HGB 12.8 07/14/2023   HCT 37.9 07/14/2023   PLT 267.0 07/14/2023   GLUCOSE 96 07/14/2023   CHOL 171 07/14/2023   TRIG 65.0 07/14/2023   HDL 73.10 07/14/2023   LDLCALC 85 07/14/2023   ALT 14 07/14/2023   AST 15 07/14/2023   NA 137 07/14/2023   K 4.9 07/14/2023   CL 105 07/14/2023   CREATININE 0.96 07/14/2023   BUN 19 07/14/2023   CO2 26 07/14/2023   TSH 0.57 07/14/2023   HGBA1C 5.5 06/21/2019    DG Bone Density Result Date:  02/20/2022 EXAM: DUAL X-RAY ABSORPTIOMETRY (DXA) FOR BONE MINERAL DENSITY IMPRESSION: Your patient Myrakle Wingler completed a BMD test on 02/20/2022 using the [employer] DXA System (software version: 14.10) manufactured by [employer]. The following summarizes the results of our evaluation. Technologist: MTB PATIENT BIOGRAPHICAL: Name: Houa, Nie Patient ID: [id number] Birth Date: 02/03/1951 Height: 64.0 in. Gender: Female Exam Date: 02/20/2022 Weight: 138.0 lbs. Indications: Caucasian, Hypothyroid, Hysterectomy, Postmenopausal Fractures: Right forearm Treatments: Calcium, Levothyroxine, Vitamin D DENSITOMETRY RESULTS: Site         Region     Measured Date Measured Age WHO Classification Young Adult T-score BMD         %Change vs. Previous Significant Change (*) AP Spine L1-L3 02/20/2022 70.6 Osteopenia -1.4 1.011 g/cm2 -2.5% - AP Spine L1-L3 11/19/2016 65.4 Osteopenia -1.2 1.037 g/cm2 - - DualFemur Neck Right 02/20/2022 70.6 Osteopenia -2.2 0.733 g/cm2 0.5% - DualFemur Neck Right 11/19/2016 65.4 Osteopenia -2.2 0.729 g/cm2 - - DualFemur Total Mean 02/20/2022 70.6 Osteopenia -2.0 0.762 g/cm2 -1.2% - DualFemur Total Mean 11/19/2016 65.4 Osteopenia -1.9 0.771 g/cm2 - - Left Forearm Radius 33% 02/20/2022 70.6 Osteoporosis -3.8 0.542 g/cm2 - - ASSESSMENT: The BMD measured at Forearm Radius 33% is 0.542 g/cm2 with a T-score of -3.8. This patient is considered osteoporotic according to World Health Organization [facility]) criteria. The scan quality is good. L-4 was excluded due to degenerative changes. Compared with prior study, there has been no significant change in the spine. Compared with prior study, there has been no  significant change in the total hip. World Science writer [facility]) criteria for post-menopausal, Caucasian Women: Normal:                   T-score at or above -1 SD Osteopenia/low bone mass: T-score between -1 and -2.5 SD Osteoporosis:             T-score at or below -2.5 SD  RECOMMENDATIONS: 1. All patients should optimize calcium and vitamin D intake. 2. Consider FDA-approved medical therapies in postmenopausal women and men aged 50 years and older, based on the following: a. A hip or vertebral(clinical or morphometric) fracture b. T-score < -2.5 at the femoral neck or spine after appropriate evaluation to exclude secondary causes c. Low bone mass (T-score between -1.0 and -2.5 at the femoral neck or spine) and a 10-year probability of a hip fracture > 3% or a 10-year probability of a major osteoporosis-related fracture > 20% based on the [place]-adapted WHO algorithm 3. Clinician judgment and/or patient preferences may indicate treatment for people with 10-year fracture probabilities above or below these levels FOLLOW-UP: People with diagnosed cases of osteoporosis or at high risk for fracture should have regular bone mineral density tests. For patients eligible for Medicare, routine testing is allowed once every 2 years. The testing frequency can be increased to one year for patients who have rapidly progressing disease, those who are receiving or discontinuing medical therapy to restore bone mass, or have additional risk factors. I have reviewed this report, and agree with the above findings. [facility] Electronically Signed   By: Frederico Hamman M.D.   On: 02/20/2022 09:22       Assessment & Plan:  Stress Assessment & Plan: Overall appears to be doing well. Follow.    Primary hypertension Assessment & Plan: Blood pressure has been under good control.  Continue lisinopril.  Follow pressures.  Follow metabolic panel.   Orders: -     Basic metabolic panel with GFR; Future  Hypercholesterolemia Assessment & Plan: Continue crestor. Low cholesterol diet and exercise.  Follow lipid panel and liver function tests.  No changes today.  Lab Results  Component Value Date   CHOL 171 07/14/2023   HDL 73.10 07/14/2023   LDLCALC 85 07/14/2023   TRIG 65.0  07/14/2023   CHOLHDL 2 07/14/2023     Orders: -     Lipid panel; Future -     Hepatic function panel; Future  Osteoporosis without current pathological fracture, unspecified osteoporosis type Assessment & Plan: Evaluated by endocrinology - f/u bone density. Recommended to continue calcium and vitamin D and weight bearing exercise.    Hypothyroidism, unspecified type Assessment & Plan: On thyroid replacement. Follow tsh.    History of colonic polyps Assessment & Plan: Saw Dr Norma Fredrickson.  Colonoscopy 02/2018.  Recommended f/u colonoscopy 10 years.    Nonintractable headache, unspecified chronicity pattern, unspecified headache type Assessment & Plan: Has a history of migraine headaches. Recent headache as outlined. Reported decreased sleep. Some allergy symptoms. No headache now. Discussed flonase. Follow. Call with update.    Environmental allergies Assessment & Plan: Flonase nasal spray.    Other orders -     Ondansetron HCl; Take 1 tablet (4 mg total) by mouth 2 (two) times daily as needed for nausea or vomiting.  Dispense: 20 tablet; Refill: 0 -     Cefdinir; Take 1 capsule (300 mg total) by mouth 2 (two) times daily.  Dispense: 14 capsule; Refill: 0 -  Cyanocobalamin; Inject 1 mL into the muscle once every 30 days.  Dispense: 10 mL; Refill: 0     Dale Meadow Glade, MD

## 2023-07-19 ENCOUNTER — Telehealth: Payer: Self-pay | Admitting: Internal Medicine

## 2023-07-19 ENCOUNTER — Encounter: Payer: Self-pay | Admitting: Internal Medicine

## 2023-07-19 NOTE — Assessment & Plan Note (Signed)
Blood pressure has been under good control.  Continue lisinopril.  Follow pressures.  Follow metabolic panel.

## 2023-07-19 NOTE — Assessment & Plan Note (Signed)
 Overall appears to be doing well.  Follow.

## 2023-07-19 NOTE — Assessment & Plan Note (Signed)
 Evaluated by endocrinology - f/u bone density. Recommended to continue calcium and vitamin D and weight bearing exercise.

## 2023-07-19 NOTE — Assessment & Plan Note (Signed)
 Continue crestor. Low cholesterol diet and exercise.  Follow lipid panel and liver function tests.  No changes today.  Lab Results  Component Value Date   CHOL 171 07/14/2023   HDL 73.10 07/14/2023   LDLCALC 85 07/14/2023   TRIG 65.0 07/14/2023   CHOLHDL 2 07/14/2023

## 2023-07-19 NOTE — Assessment & Plan Note (Signed)
Saw Dr Toledo.  Colonoscopy 02/2018.  Recommended f/u colonoscopy 10 years.  

## 2023-07-19 NOTE — Assessment & Plan Note (Signed)
 On thyroid replacement.  Follow tsh.

## 2023-07-19 NOTE — Assessment & Plan Note (Signed)
 Flonase nasal spray

## 2023-07-19 NOTE — Assessment & Plan Note (Signed)
 Has a history of migraine headaches. Recent headache as outlined. Reported decreased sleep. Some allergy symptoms. No headache now. Discussed flonase. Follow. Call with update.

## 2023-07-19 NOTE — Telephone Encounter (Signed)
 Letter printed for pt as requested.

## 2023-07-21 DIAGNOSIS — H0011 Chalazion right upper eyelid: Secondary | ICD-10-CM | POA: Diagnosis not present

## 2023-07-27 DIAGNOSIS — H0011 Chalazion right upper eyelid: Secondary | ICD-10-CM | POA: Diagnosis not present

## 2023-10-19 ENCOUNTER — Telehealth: Payer: Self-pay

## 2023-10-19 NOTE — Telephone Encounter (Signed)
 Copied from CRM [id number]. Topic: Appointments - Appointment Scheduling >> Oct 19, 2023 10:54 AM Turkey A wrote: Patient would like a call to see if there are any appointments with providers at office. Agent check and next appt is on 7/14 patient would like sooner appt please contact >> Oct 19, 2023 12:23 PM Darice RAMAN wrote: Patient scheduled

## 2023-10-19 NOTE — Telephone Encounter (Signed)
 Pt is scheduled with Vincente, NP tomorrow.

## 2023-10-20 ENCOUNTER — Ambulatory Visit (INDEPENDENT_AMBULATORY_CARE_PROVIDER_SITE_OTHER)

## 2023-10-20 ENCOUNTER — Ambulatory Visit (INDEPENDENT_AMBULATORY_CARE_PROVIDER_SITE_OTHER): Admitting: Nurse Practitioner

## 2023-10-20 ENCOUNTER — Encounter: Payer: Self-pay | Admitting: Nurse Practitioner

## 2023-10-20 VITALS — BP 124/72 | HR 66 | Temp 98.0°F | Ht 64.0 in | Wt 135.6 lb

## 2023-10-20 DIAGNOSIS — J069 Acute upper respiratory infection, unspecified: Secondary | ICD-10-CM

## 2023-10-20 MED ORDER — AMOXICILLIN-POT CLAVULANATE 875-125 MG PO TABS: 1.0000 | ORAL_TABLET | Freq: Two times a day (BID) | ORAL | 0 refills | Status: DC

## 2023-10-20 MED ORDER — PREDNISONE 20 MG PO TABS: 20.0000 mg | ORAL_TABLET | Freq: Every day | ORAL | 0 refills | Status: AC

## 2023-10-20 MED ORDER — LORATADINE 10 MG PO TABS: 10.0000 mg | ORAL_TABLET | Freq: Every day | ORAL | 0 refills | Status: AC

## 2023-10-20 MED ORDER — BENZONATATE 100 MG PO CAPS: 100.0000 mg | ORAL_CAPSULE | Freq: Three times a day (TID) | ORAL | 0 refills | Status: DC | PRN

## 2023-10-20 NOTE — Progress Notes (Signed)
 Established Patient Office Visit  Subjective:  Patient ID: Wendy Nichols, female    DOB: Sep 21, 1950  Age: 73 y.o. MRN: [id number]  CC:  Chief Complaint  Patient presents with   Acute Visit    Dry cough since 5/25, chest & head congestion, body aches for only 1 day and hoarse voice x 10 days     HPI  Wendy Nichols presents for cough.   Cough This is a new problem. The current episode started more than 1 month ago. The problem has been gradually worsening. The problem occurs every few minutes. The cough is Non-productive. Associated symptoms include postnasal drip. Pertinent negatives include no chest pain, ear pain, fever or shortness of breath. Associated symptoms comments: chest and head congestion and hoarse voice. The symptoms are aggravated by lying down. She has tried OTC cough suppressant for the symptoms. The treatment provided mild relief.     Past Medical History:  Diagnosis Date   GERD (gastroesophageal reflux disease)    History of colon polyps    Hypercholesterolemia    Hypertension    Hypothyroidism    Osteoporosis    Pernicious anemia     Past Surgical History:  Procedure Laterality Date   ABDOMINAL HYSTERECTOMY  1997   abnormal uterine bleeding.    BREAST BIOPSY     biopsies in both breasts   DILATION AND CURETTAGE OF UTERUS  1979    Family History  Problem Relation Age of Onset   Breast cancer Mother    Kidney disease Mother    Hypertension Mother    Heart disease Mother    Heart disease Father        myocardial infarction   Breast cancer Maternal Aunt    Breast cancer Maternal Grandmother    Heart disease Brother        myocardial infarction   Brain cancer Brother     Social History   Socioeconomic History   Marital status: Married    Spouse name: Not on file   Number of children: 3   Years of education: Not on file   Highest education level: Not on file  Occupational History    Employer: [employer]  Tobacco Use   Smoking  status: Never   Smokeless tobacco: Never  Substance and Sexual Activity   Alcohol use: Yes    Alcohol/week: 0.0 standard drinks of alcohol    Comment: occasional wine   Drug use: No   Sexual activity: Not on file  Other Topics Concern   Not on file  Social History Narrative   widow   Social Drivers of Health   Financial Resource Strain: Low Risk  (07/14/2023)   Overall Financial Resource Strain (CARDIA)    Difficulty of Paying Living Expenses: Not hard at all  Food Insecurity: No Food Insecurity (07/14/2023)   Hunger Vital Sign    Worried About Running Out of Food in the Last Year: Never true    Ran Out of Food in the Last Year: Never true  Transportation Needs: No Transportation Needs (07/14/2023)   PRAPARE - Administrator, Civil Service (Medical): No    Lack of Transportation (Non-Medical): No  Physical Activity: Sufficiently Active (07/14/2023)   Exercise Vital Sign    Days of Exercise per Week: 5 days    Minutes of Exercise per Session: 30 min  Stress: No Stress Concern Present (07/14/2023)   [employer] of Occupational Health - Occupational Stress Questionnaire    Feeling of  Stress : Not at all  Social Connections: Moderately Integrated (07/14/2023)   Social Connection and Isolation Panel    Frequency of Communication with Friends and Family: More than three times a week    Frequency of Social Gatherings with Friends and Family: Twice a week    Attends Religious Services: More than 4 times per year    Active Member of [employer] or Organizations: Yes    Attends Banker Meetings: More than 4 times per year    Marital Status: Widowed  Intimate Partner Violence: Not At Risk (01/06/2023)   Humiliation, Afraid, Rape, and Kick questionnaire    Fear of Current or Ex-Partner: No    Emotionally Abused: No    Physically Abused: No    Sexually Abused: No     Outpatient Medications Prior to Visit  Medication Sig Dispense Refill   ALPRAZolam  (XANAX ) 0.25 MG  tablet TAKE 1 TABLET BY MOUTH DAILY AS NEEDED FOR ANXIETY 30 tablet 0   aspirin 81 MG tablet Take 81 mg by mouth every other day.     cefdinir  (OMNICEF ) 300 MG capsule Take 1 capsule (300 mg total) by mouth 2 (two) times daily. 14 capsule 0   cholecalciferol (VITAMIN D ) 25 MCG (1000 UT) tablet Take 1,000 Units by mouth daily.     cyanocobalamin  (VITAMIN B12) 1000 MCG/ML injection Inject 1 mL into the muscle once every 30 days. 10 mL 0   lisinopril  (ZESTRIL ) 10 MG tablet TAKE 1 TABLET BY MOUTH ONCE DAILY 90 tablet 3   metoprolol  tartrate (LOPRESSOR ) 25 MG tablet TAKE 1/2 TABLET BY MOUTH DAILY 45 tablet 3   ondansetron  (ZOFRAN ) 4 MG tablet Take 1 tablet (4 mg total) by mouth 2 (two) times daily as needed for nausea or vomiting. 20 tablet 0   rosuvastatin  (CRESTOR ) 10 MG tablet TAKE 1 TABLET BY MOUTH ONCE DAILY 90 tablet 3   SYRINGE-NEEDLE, DISP, 3 ML 25G X 1 3 ML MISC Use as directed to administer b12 injections 50 each 0   levothyroxine  (SYNTHROID ) 100 MCG tablet Take 1 tablet (100 mcg total) by mouth daily. 90 tablet 3   No facility-administered medications prior to visit.    Allergies  Allergen Reactions   Codeine Nausea And Vomiting   Sulfa Antibiotics Rash    ROS Review of Systems  Constitutional:  Negative for fever.  HENT:  Positive for postnasal drip. Negative for ear pain.   Respiratory:  Positive for cough. Negative for shortness of breath.   Cardiovascular:  Negative for chest pain.   Negative unless indicated in HPI.    Objective:    Physical Exam Constitutional:      Appearance: Normal appearance.  HENT:     Right Ear: Tympanic membrane normal. Tympanic membrane is not erythematous.     Left Ear: Tympanic membrane normal. Tympanic membrane is not erythematous.     Nose:     Right Sinus: No frontal sinus tenderness.     Left Sinus: No frontal sinus tenderness.     Mouth/Throat:     Mouth: Mucous membranes are moist.     Pharynx: Postnasal drip present. No  pharyngeal swelling, oropharyngeal exudate or posterior oropharyngeal erythema.     Tonsils: No tonsillar exudate.  Cardiovascular:     Rate and Rhythm: Normal rate and regular rhythm.  Pulmonary:     Effort: Pulmonary effort is normal.     Breath sounds: Normal breath sounds. No stridor. No wheezing.  Neurological:     General:  No focal deficit present.     Mental Status: She is alert and oriented to person, place, and time. Mental status is at baseline.  Psychiatric:        Mood and Affect: Mood normal.        Behavior: Behavior normal.        Thought Content: Thought content normal.        Judgment: Judgment normal.     BP 124/72   Pulse 66   Temp 98 F (36.7 C)   Ht 5' 4 (1.626 m)   Wt 135 lb 9.6 oz (61.5 kg)   LMP 03/25/1996   SpO2 99%   BMI 23.28 kg/m  Wt Readings from Last 3 Encounters:  10/20/23 135 lb 9.6 oz (61.5 kg)  07/16/23 139 lb (63 kg)  01/15/23 139 lb 6.4 oz (63.2 kg)     Health Maintenance  Topic Date Due   Zoster Vaccines- Shingrix (1 of 2) Never done   COVID-19 Vaccine (4 - 2024-25 season) 12/14/2022   Medicare Annual Wellness (AWV)  01/06/2024   MAMMOGRAM  11/10/2023   INFLUENZA VACCINE  11/13/2023   Colonoscopy  03/05/2028   DTaP/Tdap/Td (3 - Td or Tdap) 11/30/2031   Pneumococcal Vaccine: 50+ Years  Completed   DEXA SCAN  Completed   Hepatitis C Screening  Completed   Hepatitis B Vaccines  Aged Out   HPV VACCINES  Aged Out   Meningococcal B Vaccine  Aged Out    There are no preventive care reminders to display for this patient.  Lab Results  Component Value Date   TSH 0.57 07/14/2023   Lab Results  Component Value Date   WBC 4.8 07/14/2023   HGB 12.8 07/14/2023   HCT 37.9 07/14/2023   MCV 91.1 07/14/2023   PLT 267.0 07/14/2023   Lab Results  Component Value Date   NA 137 07/14/2023   K 4.9 07/14/2023   CO2 26 07/14/2023   GLUCOSE 96 07/14/2023   BUN 19 07/14/2023   CREATININE 0.96 07/14/2023   BILITOT 0.6 07/14/2023    ALKPHOS 44 07/14/2023   AST 15 07/14/2023   ALT 14 07/14/2023   PROT 7.1 07/14/2023   ALBUMIN 4.4 07/14/2023   CALCIUM  9.5 07/14/2023   ANIONGAP 9 03/13/2012   GFR 59.29 (L) 07/14/2023   Lab Results  Component Value Date   CHOL 171 07/14/2023   Lab Results  Component Value Date   HDL 73.10 07/14/2023   Lab Results  Component Value Date   LDLCALC 85 07/14/2023   Lab Results  Component Value Date   TRIG 65.0 07/14/2023   Lab Results  Component Value Date   CHOLHDL 2 07/14/2023   Lab Results  Component Value Date   HGBA1C 5.5 06/21/2019      Assessment & Plan:  URI with cough and congestion Assessment & Plan: Pt is afebrile, non toxic appearing, without respiratoty distress. Discussed X ray findings with the patient.  Symptoms present for more than 4 weeks with no sign of resolution,  -Will treat with Augmentin , tessalon  Perles and prednisone . -Advised to take plain Mucinex for congestion, and take OTC antihistamine for PND. -Increase fluid intake and rest. - Will repeat chest xray in 6 weeks.     Orders: -     DG Chest 2 View; Future  Other orders -     predniSONE ; Take 1 tablet (20 mg total) by mouth daily with breakfast for 5 days.  Dispense: 5 tablet; Refill: 0 -  Loratadine ; Take 1 tablet (10 mg total) by mouth daily.  Dispense: 30 tablet; Refill: 0 -     Amoxicillin -Pot Clavulanate; Take 1 tablet by mouth 2 (two) times daily.  Dispense: 20 tablet; Refill: 0 -     Benzonatate ; Take 1 capsule (100 mg total) by mouth 3 (three) times daily as needed.  Dispense: 20 capsule; Refill: 0    Follow-up: No follow-ups on file.   Taylin Mans, NP

## 2023-10-20 NOTE — Patient Instructions (Addendum)
 Will call with chest Xray result. Start Augmentin  twice a day, take probiotics to reduce GI side effect. Take prednisone  in the morning. Take plain Mucinex for chest congestion and claritin  for post nasal drip. Have send cough medication benzonatate  (tesslon perls)  Incresae fluid intake and rest. Use steam and humidifier.

## 2023-10-26 ENCOUNTER — Other Ambulatory Visit: Payer: Self-pay | Admitting: Internal Medicine

## 2023-10-28 ENCOUNTER — Telehealth: Payer: Self-pay

## 2023-10-28 NOTE — Telephone Encounter (Signed)
 Copied from CRM [id number]. Topic: Clinical - Medical Advice >> Oct 28, 2023 10:14 AM Aleatha C wrote: Reason for CRM: Patient would like to know if taking zinc is ok with all her current medications and if so what milligram should she take , or if there is any other supplements that she can take to deflect her cold

## 2023-10-29 NOTE — Telephone Encounter (Signed)
 Patient is getting over a cold. Finishes up her abx tomorrow. Feeling better each day. She is wondering if there is something that she can take for immune support (vit C, zinc, airborne, emergen-C?) She says that she has been sick several times this year and she thinks it is because she is traveling so much. She has been on 10 flights within the last month. She is flying back and forth to [place]  to keep her grandkids. She would like to start something to see if it will help. She is scheduled to fly out again next week.

## 2023-10-30 NOTE — Telephone Encounter (Signed)
Pt is aware of below.

## 2023-10-30 NOTE — Telephone Encounter (Signed)
 The best way to try and help prevent cold/other illness is following a diet rich in fruits, vegetables and protein. (This will provide essential vitamins and minerals). Also staying well hydrated, quality sleep, stress management and regular exercise can boost your immune system. Vitamin C - found in citrus fruits, berries and leafy greens. Zinc - found in lean meats, poultry and nuts. Also, frequent hand washing, avoid touching face and cleaning surfaces.

## 2023-11-08 DIAGNOSIS — J069 Acute upper respiratory infection, unspecified: Secondary | ICD-10-CM | POA: Insufficient documentation

## 2023-11-08 NOTE — Assessment & Plan Note (Signed)
 Pt is afebrile, non toxic appearing, without respiratoty distress. Discussed X ray findings with the patient.  Symptoms present for more than 4 weeks with no sign of resolution,  -Will treat with Augmentin , tessalon  Perles and prednisone . -Advised to take plain Mucinex for congestion, and take OTC antihistamine for PND. -Increase fluid intake and rest. - Will repeat chest xray in 6 weeks.

## 2023-11-18 DIAGNOSIS — Z1231 Encounter for screening mammogram for malignant neoplasm of breast: Secondary | ICD-10-CM | POA: Diagnosis not present

## 2023-11-18 LAB — HM MAMMOGRAPHY

## 2023-11-19 ENCOUNTER — Encounter: Payer: Self-pay | Admitting: Internal Medicine

## 2023-11-23 ENCOUNTER — Ambulatory Visit

## 2023-11-23 ENCOUNTER — Telehealth: Payer: Self-pay

## 2023-11-23 ENCOUNTER — Other Ambulatory Visit: Payer: Self-pay

## 2023-11-23 DIAGNOSIS — J069 Acute upper respiratory infection, unspecified: Secondary | ICD-10-CM

## 2023-11-23 NOTE — Telephone Encounter (Signed)
 Copied from CRM [id number]. Topic: Clinical - Request for Lab/Test Order >> Nov 23, 2023 11:57 AM Wendy Nichols wrote: Reason for CRM: Patient called in stating that NP Vincente requested for her to have a follow up 6 week x ray of her chest. Patient is requesting a order be placed so she can schedule a appointment with radiology. Patient can be reached at [phone]. Patient also wants to know if she still needs to get a xray.

## 2023-11-23 NOTE — Telephone Encounter (Signed)
 Patient is scheduled for 12/01/23 at 10:30.

## 2023-11-30 DIAGNOSIS — K080 Exfoliation of teeth due to systemic causes: Secondary | ICD-10-CM | POA: Diagnosis not present

## 2023-12-01 ENCOUNTER — Other Ambulatory Visit

## 2023-12-01 ENCOUNTER — Ambulatory Visit (INDEPENDENT_AMBULATORY_CARE_PROVIDER_SITE_OTHER)

## 2023-12-01 DIAGNOSIS — J069 Acute upper respiratory infection, unspecified: Secondary | ICD-10-CM | POA: Diagnosis not present

## 2023-12-02 NOTE — Telephone Encounter (Unsigned)
 Copied from CRM [id number]. Topic: Clinical - Lab/Test Results >> Dec 02, 2023  9:23 AM Suzen RAMAN wrote: Reason for CRM: Patient would like a call back to discuss X-Ray results.  CB# [phone]

## 2023-12-04 ENCOUNTER — Other Ambulatory Visit: Payer: Self-pay | Admitting: Nurse Practitioner

## 2023-12-04 ENCOUNTER — Ambulatory Visit: Payer: Self-pay | Admitting: Nurse Practitioner

## 2023-12-04 NOTE — Telephone Encounter (Unsigned)
 Copied from CRM [id number]. Topic: Clinical - Lab/Test Results >> Dec 04, 2023  8:39 AM Burnard DEL wrote: Reason for CRM: Patient called in to check the status of her xray results from 8/19. She would ike to know if provider has reviewed them yet?

## 2023-12-07 NOTE — Telephone Encounter (Signed)
 Pt spoke to see imaging result.

## 2024-01-21 ENCOUNTER — Other Ambulatory Visit (INDEPENDENT_AMBULATORY_CARE_PROVIDER_SITE_OTHER)

## 2024-01-21 ENCOUNTER — Ambulatory Visit: Payer: Self-pay | Admitting: Internal Medicine

## 2024-01-21 DIAGNOSIS — E7800 Pure hypercholesterolemia, unspecified: Secondary | ICD-10-CM

## 2024-01-21 DIAGNOSIS — I10 Essential (primary) hypertension: Secondary | ICD-10-CM

## 2024-01-21 LAB — LIPID PANEL
Cholesterol: 157 mg/dL (ref 0–200)
HDL: 67.9 mg/dL (ref >39.00)
LDL Cholesterol: 76 mg/dL (ref 0–99)
NonHDL: 89.49
Total CHOL/HDL Ratio: 2
Triglycerides: 65 mg/dL (ref 0.0–149.0)
VLDL: 13 mg/dL (ref 0.0–40.0)

## 2024-01-21 LAB — HEPATIC FUNCTION PANEL
ALT: 12 U/L (ref 0–35)
AST: 15 U/L (ref 0–37)
Albumin: 4.2 g/dL (ref 3.5–5.2)
Alkaline Phosphatase: 42 U/L (ref 39–117)
Bilirubin, Direct: 0.1 mg/dL (ref 0.0–0.3)
Total Bilirubin: 0.5 mg/dL (ref 0.2–1.2)
Total Protein: 6.4 g/dL (ref 6.0–8.3)

## 2024-01-21 LAB — BASIC METABOLIC PANEL WITH GFR
BUN: 18 mg/dL (ref 6–23)
CO2: 28 meq/L (ref 19–32)
Calcium: 8.9 mg/dL (ref 8.4–10.5)
Chloride: 107 meq/L (ref 96–112)
Creatinine, Ser: 0.87 mg/dL (ref 0.40–1.20)
GFR: 66.48 mL/min (ref >60.00)
Glucose, Bld: 97 mg/dL (ref 70–99)
Potassium: 4.7 meq/L (ref 3.5–5.1)
Sodium: 140 meq/L (ref 135–145)

## 2024-01-25 DIAGNOSIS — K080 Exfoliation of teeth due to systemic causes: Secondary | ICD-10-CM | POA: Diagnosis not present

## 2024-01-27 ENCOUNTER — Ambulatory Visit
Admission: RE | Admit: 2024-01-27 | Discharge: 2024-01-27 | Disposition: A | Discharge status: Home / Self Care | Source: Ambulatory Visit | Attending: Internal Medicine | Admitting: Internal Medicine

## 2024-01-27 ENCOUNTER — Encounter: Admitting: Internal Medicine

## 2024-01-27 ENCOUNTER — Ambulatory Visit: Admitting: Internal Medicine

## 2024-01-27 ENCOUNTER — Encounter: Payer: Self-pay | Admitting: Internal Medicine

## 2024-01-27 ENCOUNTER — Ambulatory Visit
Admission: RE | Admit: 2024-01-27 | Discharge: 2024-01-27 | Disposition: A | Discharge status: Home / Self Care | Attending: Internal Medicine | Admitting: Internal Medicine

## 2024-01-27 VITALS — BP 102/68 | HR 57 | Temp 98.3°F | Ht 64.0 in | Wt 137.6 lb

## 2024-01-27 DIAGNOSIS — I10 Essential (primary) hypertension: Secondary | ICD-10-CM

## 2024-01-27 DIAGNOSIS — R9389 Abnormal findings on diagnostic imaging of other specified body structures: Secondary | ICD-10-CM | POA: Diagnosis not present

## 2024-01-27 DIAGNOSIS — J40 Bronchitis, not specified as acute or chronic: Secondary | ICD-10-CM | POA: Diagnosis not present

## 2024-01-27 DIAGNOSIS — E7800 Pure hypercholesterolemia, unspecified: Secondary | ICD-10-CM

## 2024-01-27 DIAGNOSIS — E039 Hypothyroidism, unspecified: Secondary | ICD-10-CM

## 2024-01-27 DIAGNOSIS — J929 Pleural plaque without asbestos: Secondary | ICD-10-CM | POA: Diagnosis not present

## 2024-01-27 DIAGNOSIS — E538 Deficiency of other specified B group vitamins: Secondary | ICD-10-CM | POA: Diagnosis not present

## 2024-01-27 DIAGNOSIS — Z0000 Encounter for general adult medical examination without abnormal findings: Secondary | ICD-10-CM | POA: Diagnosis not present

## 2024-01-27 DIAGNOSIS — D510 Vitamin B12 deficiency anemia due to intrinsic factor deficiency: Secondary | ICD-10-CM

## 2024-01-27 MED ORDER — CYANOCOBALAMIN 1000 MCG/ML IJ SOLN
1000.0000 ug | Freq: Once | INTRAMUSCULAR | Status: AC
  Administered 2024-01-27: 1000 ug via INTRAMUSCULAR

## 2024-01-27 NOTE — Assessment & Plan Note (Signed)
 Physical today 01/27/24.  Mammogram 11/18/23- ok.  Recommended f/u in one year.  Colonoscopy 02/2018.  Recommended f/u colonoscopy in 10 years.

## 2024-01-27 NOTE — Progress Notes (Signed)
 Subjective:    Patient ID: Wendy Nichols, female    DOB: 1950/05/10, 73 y.o.   MRN: [id number]  Patient here for  Chief Complaint  Patient presents with   Annual Exam    HPI Here for a physical exam. Evaluated over the summer for respiratory infection. CXR 12/01/23 - stable diffuse interstitial prominence throughout the lungs, favored chronic scarring over atypical infection. She is having no symptoms now. No cough or congestion. No sob. Stays active. No chest pain. No nausea or vomiting. No abdominal pain or bowel change.    Past Medical History:  Diagnosis Date   GERD (gastroesophageal reflux disease)    History of colon polyps    Hypercholesterolemia    Hypertension    Hypothyroidism    Osteoporosis    Pernicious anemia    Past Surgical History:  Procedure Laterality Date   ABDOMINAL HYSTERECTOMY  1997   abnormal uterine bleeding.    BREAST BIOPSY     biopsies in both breasts   DILATION AND CURETTAGE OF UTERUS  1979   Family History  Problem Relation Age of Onset   Breast cancer Mother    Kidney disease Mother    Hypertension Mother    Heart disease Mother    Heart disease Father        myocardial infarction   Breast cancer Maternal Aunt    Breast cancer Maternal Grandmother    Heart disease Brother        myocardial infarction   Brain cancer Brother    Social History   Socioeconomic History   Marital status: Married    Spouse name: Not on file   Number of children: 3   Years of education: Not on file   Highest education level: Not on file  Occupational History    Employer: [employer]  Tobacco Use   Smoking status: Never   Smokeless tobacco: Never  Substance and Sexual Activity   Alcohol use: Yes    Alcohol/week: 0.0 standard drinks of alcohol    Comment: occasional wine   Drug use: No   Sexual activity: Not on file  Other Topics Concern   Not on file  Social History Narrative   widow   Social Drivers of Health   Financial Resource Strain:  Low Risk  (07/14/2023)   Overall Financial Resource Strain (CARDIA)    Difficulty of Paying Living Expenses: Not hard at all  Food Insecurity: No Food Insecurity (07/14/2023)   Hunger Vital Sign    Worried About Running Out of Food in the Last Year: Never true    Ran Out of Food in the Last Year: Never true  Transportation Needs: No Transportation Needs (07/14/2023)   PRAPARE - Administrator, Civil Service (Medical): No    Lack of Transportation (Non-Medical): No  Physical Activity: Sufficiently Active (07/14/2023)   Exercise Vital Sign    Days of Exercise per Week: 5 days    Minutes of Exercise per Session: 30 min  Stress: No Stress Concern Present (07/14/2023)   [employer] of Occupational Health - Occupational Stress Questionnaire    Feeling of Stress : Not at all  Social Connections: Moderately Integrated (07/14/2023)   Social Connection and Isolation Panel    Frequency of Communication with Friends and Family: More than three times a week    Frequency of Social Gatherings with Friends and Family: Twice a week    Attends Religious Services: More than 4 times per year  Active Member of Clubs or Organizations: Yes    Attends Banker Meetings: More than 4 times per year    Marital Status: Widowed     Review of Systems  Constitutional:  Negative for appetite change and unexpected weight change.  HENT:  Negative for congestion, sinus pressure and sore throat.   Eyes:  Negative for pain and visual disturbance.  Respiratory:  Negative for cough, chest tightness and shortness of breath.   Cardiovascular:  Negative for chest pain, palpitations and leg swelling.  Gastrointestinal:  Negative for abdominal pain, diarrhea, nausea and vomiting.  Genitourinary:  Negative for difficulty urinating and dysuria.  Musculoskeletal:  Negative for joint swelling and myalgias.  Skin:  Negative for color change and rash.  Neurological:  Negative for dizziness and headaches.   Hematological:  Negative for adenopathy. Does not bruise/bleed easily.  Psychiatric/Behavioral:  Negative for agitation and dysphoric mood.        Objective:     BP 102/68 (BP Location: Left Arm, Patient Position: Sitting, Cuff Size: Normal)   Pulse (!) 57   Temp 98.3 F (36.8 C) (Oral)   Ht 5' 4 (1.626 m)   Wt 137 lb 9.6 oz (62.4 kg)   LMP 03/25/1996   SpO2 99%   BMI 23.62 kg/m  Wt Readings from Last 3 Encounters:  01/27/24 137 lb 9.6 oz (62.4 kg)  10/20/23 135 lb 9.6 oz (61.5 kg)  07/16/23 139 lb (63 kg)    Physical Exam Vitals reviewed.  Constitutional:      General: She is not in acute distress.    Appearance: Normal appearance. She is well-developed.  HENT:     Head: Normocephalic and atraumatic.     Right Ear: External ear normal.     Left Ear: External ear normal.     Mouth/Throat:     Pharynx: No oropharyngeal exudate or posterior oropharyngeal erythema.  Eyes:     General: No scleral icterus.       Right eye: No discharge.        Left eye: No discharge.     Conjunctiva/sclera: Conjunctivae normal.  Neck:     Thyroid : No thyromegaly.  Cardiovascular:     Rate and Rhythm: Normal rate and regular rhythm.  Pulmonary:     Effort: No tachypnea, accessory muscle usage or respiratory distress.     Breath sounds: Normal breath sounds. No decreased breath sounds or wheezing.  Chest:  Breasts:    Right: No inverted nipple, mass, nipple discharge or tenderness (no axillary adenopathy).     Left: No inverted nipple, mass, nipple discharge or tenderness (no axilarry adenopathy).  Abdominal:     General: Bowel sounds are normal.     Palpations: Abdomen is soft.     Tenderness: There is no abdominal tenderness.  Musculoskeletal:        General: No swelling or tenderness.     Cervical back: Neck supple.  Lymphadenopathy:     Cervical: No cervical adenopathy.  Skin:    Findings: No erythema or rash.  Neurological:     Mental Status: She is alert and oriented  to person, place, and time.  Psychiatric:        Mood and Affect: Mood normal.        Behavior: Behavior normal.         Outpatient Encounter Medications as of 01/27/2024  Medication Sig   aspirin 81 MG tablet Take 81 mg by mouth every other day.   cholecalciferol (  VITAMIN D ) 25 MCG (1000 UT) tablet Take 1,000 Units by mouth daily.   cyanocobalamin  (VITAMIN B12) 1000 MCG/ML injection Inject 1 mL into the muscle once every 30 days.   levothyroxine  (SYNTHROID ) 100 MCG tablet TAKE 1 TABLET EVERY DAY ON EMPTY STOMACHWITH A GLASS OF WATER AT LEAST 30-60 MINBEFORE BREAKFAST   lisinopril  (ZESTRIL ) 10 MG tablet TAKE 1 TABLET BY MOUTH ONCE DAILY   loratadine  (CLARITIN ) 10 MG tablet Take 1 tablet (10 mg total) by mouth daily.   metoprolol  tartrate (LOPRESSOR ) 25 MG tablet TAKE 1/2 TABLET BY MOUTH DAILY   ondansetron  (ZOFRAN ) 4 MG tablet Take 1 tablet (4 mg total) by mouth 2 (two) times daily as needed for nausea or vomiting.   rosuvastatin  (CRESTOR ) 10 MG tablet TAKE 1 TABLET BY MOUTH ONCE DAILY   SYRINGE-NEEDLE, DISP, 3 ML 25G X 1 3 ML MISC Use as directed to administer b12 injections   [DISCONTINUED] ALPRAZolam  (XANAX ) 0.25 MG tablet TAKE 1 TABLET BY MOUTH DAILY AS NEEDED FOR ANXIETY (Patient not taking: Reported on 01/27/2024)   [DISCONTINUED] amoxicillin -clavulanate (AUGMENTIN ) 875-125 MG tablet Take 1 tablet by mouth 2 (two) times daily. (Patient not taking: Reported on 01/27/2024)   [DISCONTINUED] benzonatate  (TESSALON  PERLES) 100 MG capsule Take 1 capsule (100 mg total) by mouth 3 (three) times daily as needed. (Patient not taking: Reported on 01/27/2024)   [DISCONTINUED] cefdinir  (OMNICEF ) 300 MG capsule Take 1 capsule (300 mg total) by mouth 2 (two) times daily. (Patient not taking: Reported on 01/27/2024)   [EXPIRED] cyanocobalamin  (VITAMIN B12) injection 1,000 mcg    No facility-administered encounter medications on file as of 01/27/2024.     Lab Results  Component Value Date   WBC  4.8 07/14/2023   HGB 12.8 07/14/2023   HCT 37.9 07/14/2023   PLT 267.0 07/14/2023   GLUCOSE 97 01/21/2024   CHOL 157 01/21/2024   TRIG 65.0 01/21/2024   HDL 67.90 01/21/2024   LDLCALC 76 01/21/2024   ALT 12 01/21/2024   AST 15 01/21/2024   NA 140 01/21/2024   K 4.7 01/21/2024   CL 107 01/21/2024   CREATININE 0.87 01/21/2024   BUN 18 01/21/2024   CO2 28 01/21/2024   TSH 0.57 07/14/2023   HGBA1C 5.5 06/21/2019    DG Bone Density Result Date: 02/20/2022 EXAM: DUAL X-RAY ABSORPTIOMETRY (DXA) FOR BONE MINERAL DENSITY IMPRESSION: Your patient Kyaira Trantham completed a BMD test on 02/20/2022 using the [employer] DXA System (software version: 14.10) manufactured by [employer]. The following summarizes the results of our evaluation. Technologist: MTB PATIENT BIOGRAPHICAL: Name: Teara, Duerksen Patient ID: [id number] Birth Date: [birth year] Height: 64.0 in. Gender: Female Exam Date: 02/20/2022 Weight: 138.0 lbs. Indications: Caucasian, Hypothyroid, Hysterectomy, Postmenopausal Fractures: Right forearm Treatments: Calcium , Levothyroxine , Vitamin D  DENSITOMETRY RESULTS: Site         Region     Measured Date Measured Age WHO Classification Young Adult T-score BMD         %Change vs. Previous Significant Change (*) AP Spine L1-L3 02/20/2022 70.6 Osteopenia -1.4 1.011 g/cm2 -2.5% - AP Spine L1-L3 11/19/2016 65.4 Osteopenia -1.2 1.037 g/cm2 - - DualFemur Neck Right 02/20/2022 70.6 Osteopenia -2.2 0.733 g/cm2 0.5% - DualFemur Neck Right 11/19/2016 65.4 Osteopenia -2.2 0.729 g/cm2 - - DualFemur Total Mean 02/20/2022 70.6 Osteopenia -2.0 0.762 g/cm2 -1.2% - DualFemur Total Mean 11/19/2016 65.4 Osteopenia -1.9 0.771 g/cm2 - - Left Forearm Radius 33% 02/20/2022 70.6 Osteoporosis -3.8 0.542 g/cm2 - - ASSESSMENT: The BMD measured at Forearm  Radius 33% is 0.542 g/cm2 with a T-score of -3.8. This patient is considered osteoporotic according to World Health Organization [facility]) criteria. The scan quality  is good. L-4 was excluded due to degenerative changes. Compared with prior study, there has been no significant change in the spine. Compared with prior study, there has been no significant change in the total hip. World Science writer [facility]) criteria for post-menopausal, Caucasian Women: Normal:                   T-score at or above -1 SD Osteopenia/low bone mass: T-score between -1 and -2.5 SD Osteoporosis:             T-score at or below -2.5 SD RECOMMENDATIONS: 1. All patients should optimize calcium  and vitamin D  intake. 2. Consider FDA-approved medical therapies in postmenopausal women and men aged 41 years and older, based on the following: a. A hip or vertebral(clinical or morphometric) fracture b. T-score < -2.5 at the femoral neck or spine after appropriate evaluation to exclude secondary causes c. Low bone mass (T-score between -1.0 and -2.5 at the femoral neck or spine) and a 10-year probability of a hip fracture > 3% or a 10-year probability of a major osteoporosis-related fracture > 20% based on the [place] -adapted WHO algorithm 3. Clinician judgment and/or patient preferences may indicate treatment for people with 10-year fracture probabilities above or below these levels FOLLOW-UP: People with diagnosed cases of osteoporosis or at high risk for fracture should have regular bone mineral density tests. For patients eligible for Medicare, routine testing is allowed once every 2 years. The testing frequency can be increased to one year for patients who have rapidly progressing disease, those who are receiving or discontinuing medical therapy to restore bone mass, or have additional risk factors. I have reviewed this report, and agree with the above findings. [facility] Electronically Signed   By: Rosaline Collet M.D.   On: 02/20/2022 09:22       Assessment & Plan:  Health care maintenance Assessment & Plan: Physical today 01/27/24.  Mammogram 11/18/23- ok.  Recommended f/u in one  year.  Colonoscopy 02/2018.  Recommended f/u colonoscopy in 10 years.     B12 deficiency Assessment & Plan: Continue b12 injections.   Orders: -     Cyanocobalamin   Abnormal CXR Assessment & Plan: Evaluated over the summer for respiratory infection. CXR 12/01/23 - stable diffuse interstitial prominence throughout the lungs, favored chronic scarring over atypical infection. She is having no symptoms now. No cough or congestion. No sob. Discussed f/u cxr to confirm if clear.   Orders: -     DG Chest 2 View; Future  Primary hypertension Assessment & Plan: Blood pressure has been under good control.  Continue lisinopril .  Follow pressures.  Follow metabolic panel.   Orders: -     Basic metabolic panel with GFR; Future  Hypercholesterolemia Assessment & Plan: Continue crestor . Low cholesterol diet and exercise.  Follow lipid panel and liver function tests.  No changes today.  Lab Results  Component Value Date   CHOL 157 01/21/2024   HDL 67.90 01/21/2024   LDLCALC 76 01/21/2024   TRIG 65.0 01/21/2024   CHOLHDL 2 01/21/2024     Orders: -     Lipid panel; Future -     Hepatic function panel; Future -     CBC with Differential/Platelet; Future  Hypothyroidism, unspecified type Assessment & Plan: On thyroid  replacement. Follow tsh.   Orders: -  TSH; Future  Pernicious anemia Assessment & Plan: Continue b12 injections. Follow cbc.       Allena Hamilton, MD

## 2024-02-01 ENCOUNTER — Encounter: Payer: Self-pay | Admitting: Internal Medicine

## 2024-02-01 NOTE — Assessment & Plan Note (Signed)
 On thyroid replacement.  Follow tsh.

## 2024-02-01 NOTE — Assessment & Plan Note (Signed)
Continue b12 injections.  

## 2024-02-01 NOTE — Assessment & Plan Note (Signed)
 Evaluated over the summer for respiratory infection. CXR 12/01/23 - stable diffuse interstitial prominence throughout the lungs, favored chronic scarring over atypical infection. She is having no symptoms now. No cough or congestion. No sob. Discussed f/u cxr to confirm if clear.

## 2024-02-01 NOTE — Assessment & Plan Note (Signed)
Blood pressure has been under good control.  Continue lisinopril.  Follow pressures.  Follow metabolic panel.

## 2024-02-01 NOTE — Assessment & Plan Note (Signed)
 Continue crestor . Low cholesterol diet and exercise.  Follow lipid panel and liver function tests.  No changes today.  Lab Results  Component Value Date   CHOL 157 01/21/2024   HDL 67.90 01/21/2024   LDLCALC 76 01/21/2024   TRIG 65.0 01/21/2024   CHOLHDL 2 01/21/2024

## 2024-02-01 NOTE — Assessment & Plan Note (Signed)
 Continue b12 injections. Follow cbc.

## 2024-04-30 ENCOUNTER — Other Ambulatory Visit: Payer: Self-pay | Admitting: Internal Medicine

## 2024-04-30 ENCOUNTER — Ambulatory Visit: Payer: Self-pay | Admitting: Internal Medicine

## 2024-04-30 DIAGNOSIS — R9389 Abnormal findings on diagnostic imaging of other specified body structures: Secondary | ICD-10-CM

## 2024-04-30 NOTE — Progress Notes (Signed)
Order placed for high resolution chest CT 

## 2024-05-17 NOTE — Telephone Encounter (Signed)
 Copied from CRM #[id number]. Topic: Appointments - Scheduling Inquiry for Clinic >> May 17, 2024  3:21 PM Emylou G wrote: Reason for CRM: Patient called back.. in regards to chest xray.. ins approved it.SABRA Pls call her back so we can schedule the xray >> May 17, 2024  3:24 PM [facility] G wrote: Patient adv ins approval ref approval #[id number] treatment ID# [id number] - ct chest w/o contrast.. is what the ins is approving

## 2024-05-20 ENCOUNTER — Telehealth: Payer: Self-pay | Admitting: Internal Medicine

## 2024-05-20 NOTE — Telephone Encounter (Signed)
 No message attached

## 2024-07-27 ENCOUNTER — Other Ambulatory Visit

## 2024-07-29 ENCOUNTER — Ambulatory Visit: Admitting: Internal Medicine
